# Patient Record
Sex: Male | Born: 1935 | Race: Black or African American | Hispanic: No | Marital: Married | Smoking: Current some day smoker
Health system: Southern US, Community
[De-identification: ages and names within clinical notes are randomized; demographics above are authoritative.]

## PROBLEM LIST (undated history)

## (undated) DIAGNOSIS — I209 Angina pectoris, unspecified: Secondary | ICD-10-CM

## (undated) DIAGNOSIS — I4891 Unspecified atrial fibrillation: Secondary | ICD-10-CM

## (undated) DIAGNOSIS — C187 Malignant neoplasm of sigmoid colon: Secondary | ICD-10-CM

## (undated) DIAGNOSIS — M109 Gout, unspecified: Secondary | ICD-10-CM

## (undated) DIAGNOSIS — I2510 Atherosclerotic heart disease of native coronary artery without angina pectoris: Secondary | ICD-10-CM

## (undated) DIAGNOSIS — R32 Unspecified urinary incontinence: Secondary | ICD-10-CM

## (undated) DIAGNOSIS — E785 Hyperlipidemia, unspecified: Secondary | ICD-10-CM

## (undated) DIAGNOSIS — I10 Essential (primary) hypertension: Secondary | ICD-10-CM

## (undated) DIAGNOSIS — K219 Gastro-esophageal reflux disease without esophagitis: Secondary | ICD-10-CM

## (undated) DIAGNOSIS — K449 Diaphragmatic hernia without obstruction or gangrene: Secondary | ICD-10-CM

## (undated) DIAGNOSIS — E119 Type 2 diabetes mellitus without complications: Secondary | ICD-10-CM

## (undated) HISTORY — DX: Gastro-esophageal reflux disease without esophagitis: K21.9

## (undated) HISTORY — PX: MINOR CARPAL TUNNEL: SHX6472

## (undated) HISTORY — DX: Atherosclerotic heart disease of native coronary artery without angina pectoris: I25.10

## (undated) HISTORY — DX: Malignant neoplasm of sigmoid colon: C18.7

## (undated) HISTORY — PX: OTHER SURGICAL HISTORY: SHX169

## (undated) HISTORY — DX: Type 2 diabetes mellitus without complications: E11.9

## (undated) HISTORY — DX: Diaphragmatic hernia without obstruction or gangrene: K44.9

## (undated) HISTORY — PX: REPLACEMENT TOTAL KNEE: SUR1224

## (undated) HISTORY — DX: Gout, unspecified: M10.9

## (undated) HISTORY — DX: Unspecified urinary incontinence: R32

## (undated) HISTORY — PX: COLONOSCOPY: SHX174

## (undated) HISTORY — PX: CHOLECYSTECTOMY: SHX55

## (undated) HISTORY — DX: Hyperlipidemia, unspecified: E78.5

---

## 2006-08-31 ENCOUNTER — Ambulatory Visit: Payer: Self-pay | Admitting: Cardiology

## 2006-09-07 ENCOUNTER — Ambulatory Visit: Payer: Self-pay | Admitting: Cardiology

## 2007-04-25 ENCOUNTER — Ambulatory Visit: Payer: Self-pay | Admitting: Cardiology

## 2009-02-04 ENCOUNTER — Ambulatory Visit: Payer: Self-pay | Admitting: Cardiology

## 2009-08-04 DIAGNOSIS — I2510 Atherosclerotic heart disease of native coronary artery without angina pectoris: Secondary | ICD-10-CM

## 2009-08-04 DIAGNOSIS — Z9861 Coronary angioplasty status: Secondary | ICD-10-CM

## 2009-08-04 DIAGNOSIS — E785 Hyperlipidemia, unspecified: Secondary | ICD-10-CM

## 2011-03-02 NOTE — Assessment & Plan Note (Signed)
[facility]                          [facility]   NAME:Antonio Watkins, ZEESHAN KORTE                     MRN:          [id number]  DATE:04/25/2007                            DOB:          1930-09-18    SUMMARY OF HISTORY:  Mr. Burnham is a 77 year old African American male  who returns for a 52-month followup.  He was last seen in the office on  August 31, 2006.  At that time, he was having some problems with  lethargy, fatigue, decreased stamina, and one episode of atypical chest  discomfort.   Since that time, he has not had any further problems with chest  discomfort.  He states that his energy level has not been a problem, and  goes on to describe last weekend he had a family reunion and did a 1-  mile hike up a mountain without any difficulties.  He has not had to use  nitroglycerin.   On review of records, Dr. Andee Lineman had deferred lipid management to his  primary care physician.  However, the patient states that this has not  been checked within the last year.   PAST MEDICAL HISTORY:   ALLERGIES:  MORPHINE.   MEDICATIONS:  1. Atenolol 25 mg daily.  2. Benazepril 40 mg daily.  3. Felodipine 10 mg daily.  4. Colchicine 0.6 daily.  5. HCTZ 12.5 daily.  6. Isosorbide 30 mg daily.  7. Oxybutynin 5 mg at bedtime.  8. Centrum Silver daily.  9. Aspirin 81 mg daily.  10.Nitroglycerin 0.4 p.r.n.   PAST MEDICAL HISTORY:  1. Bilateral total knee replacement.  2. Colon resection secondary to cancer, and takedown of an ostomy.  3. Eye surgery.  4. Bilateral carpal tunnel release.  5. Coronary artery disease, with stent placement in 2001 and 2002,      with Multi-Link Tetra stents to the RCA and circumflex.  He      received a Cordis stent to the circumflex in 2002.  6. He also has a history of hypertension.  7. Non-insulin-dependent diabetes.   SOCIAL AND FAMILY HISTORY:  Remain unchanged from his office visit on  September 01, 2007.   PHYSICAL EXAMINATION:  GENERAL:  Well-nourished, well-developed,  pleasant African American male in no apparent distress.  VITAL SIGNS:  Weight is 213.4, blood pressure 136/83, heart rate 56.  HEENT:  Unremarkable.  NECK:  Supple, without thyromegaly, adenopathy, JVD, or carotid bruits.  CHEST:  Symmetrical excursion.  LUNGS:  Clear to auscultation.  HEART:  PMI was not displaced.  Regular rate and rhythm.  I did not  appreciate any murmurs, rubs, clicks, or gallops.  ABDOMEN:  Slightly rounded.  Bowel sounds present, without organomegaly,  masses, or tenderness.  EXTREMITIES:  Negative cyanosis, clubbing, or edema.  Peripheral pulses  were symmetrical, intact, without abdominal or femoral bruits.   IMPRESSION:  1. Known coronary artery disease.  Appears to be stable at this point.  2. Hyperlipidemia.  Last check seems to be from his cardiologist in      [place], [place] and showed a total cholesterol of 228, HDL  40, LDL 151, and triglycerides 83 on June 04, 2006.  3. History as listed above.   DISPOSITION:  Dr. Gerhard Munch faxed up his lipids, and we reviewed.  Given  his history of diabetes and coronary artery disease, we will start him  on Lipitor 40 mg p.o. at bedtime.  We have written him a 90-day supply  that he will begin once he receives from his mail order.  We will  recheck fasting lipids and LFTs in approximately 8 weeks.  He was also  encouraged regular exercise and lowfat, low-salt, ADA diet.  We will see  him again in 1 year, unless he should develop any questions or problems.      Joellyn Rued, PA-C  Electronically Signed      Learta Codding, MD,FACC  Electronically Signed   EW/MedQ  DD: 04/25/2007  DT: 04/26/2007  Job #: [id number]   cc:   Linward Foster

## 2011-03-02 NOTE — Assessment & Plan Note (Signed)
[facility]                          [facility]   NAME:Antonio Watkins, Antonio Watkins                     MRN:          [id number]  DATE:02/04/2009                            DOB:          [birth year]    REFERRING PHYSICIAN:  Linward Foster   HISTORY OF PRESENT ILLNESS:  The patient is a 77 year old African  American male with history of coronary artery disease status post  Multiple-Link Tetra stents to the RCA and circumflex in 2000 and 2002  respectively as well as a coronary stent to the circumflex in 2002.  The  patient has not been seen in this office since 2008.  He did have a  stress test in 2007 which was essentially  low risk and negative for  ischemia.  The patient states that he is doing well.  He has no chest  pain, shortness of breath, orthopnea, or PND, although he carries  nitroglycerin, he has not used any in many years.  From a cardiovascular  perspective, he reports no symptoms of palpitations, presyncope, or  syncope.  He does state that he has a hard time of affording Welchol for  his dyslipidemia and apparently statins were discontinued because of  liver function elevation.   MEDICATIONS:  1. Atenolol 25 mg p.o. daily.  2. Benazepril mg p.o. daily.  3. Felodipine 10 mg p.o. daily.  4. Hydrochlorothiazide 12.5 mg p.o. daily.  5. Isosorbide 30 mg p.o. daily.  6. Oxybutynin 5 mg p.o. nightly.  7. Centrum Silver.  8. Aspirin 81 mg daily.  9. Colchicine 0.6 mg t.i.d.  10.Omega-3 fish oil daily.   PHYSICAL EXAMINATION:  VITAL SIGNS:  Blood pressure is 154/86, heart  rate 59, and weight 226 pounds.  GENERAL:  Well-nourished African American male in no apparent distress.  HEENT:  Pupils; eyes are clear.  Conjunctivae clear.  NECK:  Supple.  Normal carotid upstroke.  No carotid bruits.  LUNGS:  Clear breath sounds bilaterally.  HEART:  Regular rate and rhythm.  Normal S1 and S2.  No murmurs, rubs,  or gallops.  ABDOMEN:  Soft  and nontender.  No rebound or guarding.  There is notable  for well-healed scar prior to colon resection and takedown of an ostomy.  EXTREMITIES:  No cyanosis, clubbing, or edema.   PROBLEM LIST:  1. Coronary artery disease, see details above, stable.  2. Dyslipidemia.  Intolerant to STATINS (liver function elevation).  3. Colon resection secondary to takedown of an ostomy.  4. Bilateral total knee replacement.  5. Hypertension.  6. Non-insulin-dependent diabetes mellitus.   PLAN:  1. The patient's hypertension was probably well controlled.  Because      he did not take his medications this morning.  He just had a recent      check up with Dr. Gerhard Munch and everything appeared to be fine      regarding his blood pressure.  2. From a cardiac standpoint, the patient is also stable with no new      symptoms of angina or exertional dyspnea.  Although it is almost a  year since stress testing, I do not think he needs 1 at this point      in time and defer to next year.  3. The patient EKG was reviewed and there were no new EKG changes,      remains in normal sinus rhythm.  4. The patient does have baseline elevated liver function test, but I      do think trying to get him on red yeast rice with close followup in      4-6 weeks of liver function test should be safe.  He is unable to      afford WelChol and he is noncompliant with taking the medication.   We will see the patient back in 1 year.     Learta Codding, MD,FACC  Electronically Signed    GED/MedQ  DD: 02/04/2009  DT: 02/04/2009  Job #: [id number]   cc:   Linward Foster

## 2011-03-05 NOTE — Assessment & Plan Note (Signed)
[facility]                            [facility]   NAME:Antonio Watkins, Antonio Watkins                       MRN:          [id number]  DATE:08/31/2006                            DOB:          May 10, 1930    HISTORY OF PRESENT ILLNESS:  The patient is a 77 year old male with a  history of coronary artery disease.  The patient is status post prior  percutaneous coronary intervention with stent placement 2001 and 2002  respectively.  The patient received a Multi-Link Tetra stent to the right  coronary artery and a Multi-Link Tetra stent to the circumflex coronary  artery.  He also received a Cordis stent to the circumflex in 2002.  The  patient does have multiple cardiac risk factors including hypertension, non-  insulin-dependent diabetes mellitus.  He states that these prior procedures  were done at [facility] but has not seen a cardiologist in the last 5  years.  The patient has now been referred by Dr. Gerhard Munch for further cardiac  followup.  The patient's wife is also seen by Dr. Gerhard Munch and they have moved  from [place] to see Dr. Gerhard Munch in [place].   The patient's complaints center around symptoms of lethargy, fatigue, and  decreased stamina.  He denies any shortness of breath, orthopnea, or PND.  He does have occasional left-sided sharp pain which is not particularly  reminiscent of his prior presentation.  He states in the past prior to his  stent placement he had heavy substernal crushing chest pain.  He does have  occasional sweats and remains tired most of the time.  In the last few days  he experienced a single episode of left arm pain which was nonexertional.  It lasted for approximately 5 minutes while he was sitting.  As noted above,  also, the patient has noticed over the last several months the occurrence of  ankle edema.  He is quite concerned that this may be secondary to his  underlying heart disease.   PAST MEDICAL HISTORY:   See problem list below.  This also includes:  1. Bilateral total knee replacement.  2. History of colon resection secondary to colon cancer with takedown of      an ostomy.  3. History of eye surgery.  4. History of bilateral carpal tunnel release.   SOCIAL HISTORY:  The patient lives with his wife in [place], [place].  He does not smoke or drink.   FAMILY HISTORY:  Notable for mother who had a myocardial infarction.   MEDICATION:  1. Atenolol 25 mg a day.  2. Benazepril 40 mg a day.  3. Felodipine 10 mg a day.  4. Colchicine 0.6 mg p.o. daily.  5. Hydrochlorothiazide 12.5 mg a day.  6. Isosorbide 30 mg a day.  7. Oxybutynin 5 mg p.o. q.h.s.  8. Centrum Silver.  9. Aspirin 81 mg a day.   REVIEW OF SYSTEMS:  As per HPI.  The patient reports weight gain.  He also  reports chest pain as described above and occasional palpitations.  He  reports heartburn but no dysuria or frequency,  no cramps or joint stiffness,  no weakness or tremors.  No melena or hematochezia, no dysuria or frequency.   PHYSICAL EXAMINATION:  VITAL SIGNS:  Blood pressure 126/76, heart rate is 53  beats per minute.  NECK:  Reveals a normal carotid upstroke and no carotid bruits.  JVD is 6  cm.  HEENT:  Pupils isocoric, conjunctivae clear.  Oropharynx is clear.  LUNG:  Reveals clear breath sounds bilaterally.  HEART:  Reveals a regular rate and rhythm with normal S1 and S2.  There are  no pathological murmurs.  ABDOMEN:  Soft, nontender.  No rebound or guarding, good bowel sounds.  EXTREMITIES:  No cyanosis, clubbing or edema.  NEUROLOGIC:  The patient is alert, oriented, and grossly nonfocal.   A 12-lead electrocardiogram:  Sinus bradycardia with first-degree AV block  and nonspecific T-wave changes.   PROBLEM LIST:  1. Coronary artery disease.      a.     Atypical chest pain.      b.     Status post percutaneous coronary intervention and stent       placement to the right coronary artery and  circumflex x2 including       Cypher Cordis stent.      c.     Decreased energy level.  2. Hypertension.  3. Diabetes mellitus.  4. History of colon cancer.   PLAN:  1. The patient has known coronary artery disease.  He has not had a stress      test done in the last 3 years.  2. He does report occasional chest pain and left arm pain which is      atypical.  The patient will need further risk factor modification.  In      particular, he is currently not taking statin drug therapy and this      should be added to his medical regimen, particularly the fact that this      patient is diabetic.  I will leave this management to Dr. Gerhard Munch to      make a recommendation.  3. The patient will be referred for Cardiolite stress testing to make sure      that he does not have worsening ischemia.  4. The patient will follow up with [place] in the next couple of months and we      will report to him the results of the stress study.  5. The patient does have significant edema and I have increased his      hydrochlorothiazide to 25 mg a day.  It may well be that the patient      will require Lasix in the future.  I have also      scheduled the patient for a BMET and BNP level to make sure he does not      have significant renal insufficiency.     Learta Codding, MD,FACC  Electronically Signed    GED/MedQ  DD: 08/31/2006  DT: 08/31/2006  Job #: [id number]   cc:   Linward Foster, M.D.

## 2013-01-17 ENCOUNTER — Encounter: Payer: Self-pay | Admitting: Physician Assistant

## 2013-01-17 ENCOUNTER — Encounter: Payer: Self-pay | Admitting: Cardiovascular Disease

## 2013-01-18 ENCOUNTER — Encounter: Payer: Self-pay | Admitting: Physician Assistant

## 2013-04-12 ENCOUNTER — Encounter: Payer: Self-pay | Admitting: Cardiology

## 2013-04-23 ENCOUNTER — Encounter: Payer: Self-pay | Admitting: Physician Assistant

## 2013-05-09 ENCOUNTER — Encounter: Payer: Self-pay | Admitting: Cardiovascular Disease

## 2013-05-09 ENCOUNTER — Ambulatory Visit (INDEPENDENT_AMBULATORY_CARE_PROVIDER_SITE_OTHER): Payer: Medicare PPO | Admitting: Cardiovascular Disease

## 2013-05-09 VITALS — BP 147/84 | HR 73 | Ht 70.5 in | Wt 227.0 lb

## 2013-05-09 DIAGNOSIS — I2510 Atherosclerotic heart disease of native coronary artery without angina pectoris: Secondary | ICD-10-CM

## 2013-05-09 DIAGNOSIS — E785 Hyperlipidemia, unspecified: Secondary | ICD-10-CM

## 2013-05-09 DIAGNOSIS — I10 Essential (primary) hypertension: Secondary | ICD-10-CM

## 2013-05-09 MED ORDER — ATENOLOL 50 MG PO TABS: 50.0000 mg | ORAL_TABLET | Freq: Every day | ORAL | Status: DC

## 2013-05-09 MED ORDER — NITROGLYCERIN 0.4 MG SL SUBL: 0.4000 mg | SUBLINGUAL_TABLET | SUBLINGUAL | Status: DC | PRN

## 2013-05-09 NOTE — Progress Notes (Signed)
Patient ID: Antonio Watkins, male   DOB: 1930/06/12, 79 y.o.   MRN: [id number]    CARDIOLOGY CONSULT NOTE  Patient ID: Antonio Watkins MRN: [id number] DOB/AGE: 22-Mar-1930 79 y.o.  Primary Physician: Loney Hering, MD Reason for Consultation: CAD  HPI:  Antonio Watkins is an 79 year old African American male with history of coronary artery disease status post Multiple-Link Tetra stents to the RCA and circumflex in 2000 and 2002  Respectively, as well as a coronary stent to the circumflex in 2002.  He did have a stress echocardiogram in April 2014 which was essentially low risk and negative for  ischemia.   The patient states that he is doing well. He gets chest pain on occasion (2-3 times a month), associated with shortness of breath, but it only lasts a minute. He denies orthopnea and PND. Although he carries nitroglycerin, he has not used any in over two years. He says it has expired. He also denies palpitations, presyncope, or syncope.   He also has diabetes.  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 He has also lived in [place].  Review of systems complete and found to be negative unless listed above in HPI  Past Medical History:   No family history on file.  History   Social History  . Marital Status: Married    Spouse Name: N/A    Number of Children: N/A  . Years of Education: N/A   Occupational History  . Not on file.   Social History Main Topics  . Smoking status: Current Every Day Smoker -- 1.00 packs/day    Types: Cigars  . Smokeless tobacco: Not on file  . Alcohol Use: Yes     Comment: Socially  . Drug Use: Not on file  . Sexually Active: Not on file   Other Topics Concern  . Not on file   Social History Narrative  . No narrative on file     Physical exam Blood pressure 147/84, pulse 73, height 5' 10.5" (1.791 m), weight 227 lb (102.967 kg). General: NAD Neck: No JVD, no thyromegaly or thyroid nodule.   Lungs: Clear to auscultation bilaterally with normal respiratory effort. CV: Nondisplaced PMI.  Heart regular S1/S2, no S3/S4, no murmur. 1+ pitting pedal edema.  No carotid bruit.  Normal pedal pulses.  Abdomen: Soft, nontender, no hepatosplenomegaly, no distention.  Skin: Intact without lesions or rashes.  Neurologic: Alert and oriented x 3.  Psych: Normal affect. Extremities: No clubbing or cyanosis.  HEENT: Normal.   Labs:   No results found for this basename: WBC, HGB, HCT, MCV, PLT   No results found for this basename: NA, K, CL, CO2, BUN, CREATININE, CALCIUM, LABALBU, PROT, BILITOT, ALKPHOS, ALT, AST, GLUCOSE,  in the last 168 hours No results found for this basename: CKTOTAL, CKMB, CKMBINDEX, TROPONINI    No results found for this basename: CHOL   No results found for this basename: HDL   No results found for this basename: LDLCALC   No results found for this basename: TRIG   No results found for this basename: CHOLHDL   No results found for this basename: LDLDIRECT       EKG: Sinus rhythm, rate 73 bpm, axis within normal limits, intervals within normal limits, no acute ST-T wave changes.     ASSESSMENT AND PLAN:  1. CAD: will prescribe sublingual nitroglycerin tablets to be used prn. No other changes. He is already on Atenolol (Carvedilol stopped  in April 2014).  2. HTN: uncontrolled, will increase Atenolol to 50 mg daily. Already taking Losartan 100 mg daily. His BP is checked every Monday at church, and was told it was high at that time.  3. Hypercholesterolemia: on Lipitor.  Signed: Prentice Docker, M.D., F.A.C.C. 05/09/2013, 2:27 PM

## 2013-05-09 NOTE — Patient Instructions (Addendum)
Your physician has recommended you make the following change in your medication:   INCREASE ATENOLOL 50 MG DAILY  TAKE ALL YOUR OTHER MEDICATIONS THE SAME   REFILLED YOUR NITRO TODAY  Your physician wants you to follow-up in: 1 YEAR You will receive a reminder letter in the mail two months in advance. If you don't receive a letter, please call our office to schedule the follow-up appointment.

## 2014-05-15 ENCOUNTER — Ambulatory Visit (INDEPENDENT_AMBULATORY_CARE_PROVIDER_SITE_OTHER): Payer: Medicare PPO | Admitting: Cardiovascular Disease

## 2014-05-15 ENCOUNTER — Encounter: Payer: Self-pay | Admitting: Cardiovascular Disease

## 2014-05-15 VITALS — BP 136/77 | HR 63 | Ht 70.5 in | Wt 231.0 lb

## 2014-05-15 DIAGNOSIS — I209 Angina pectoris, unspecified: Secondary | ICD-10-CM

## 2014-05-15 DIAGNOSIS — I2510 Atherosclerotic heart disease of native coronary artery without angina pectoris: Secondary | ICD-10-CM

## 2014-05-15 DIAGNOSIS — I25119 Atherosclerotic heart disease of native coronary artery with unspecified angina pectoris: Secondary | ICD-10-CM

## 2014-05-15 DIAGNOSIS — I10 Essential (primary) hypertension: Secondary | ICD-10-CM

## 2014-05-15 DIAGNOSIS — E785 Hyperlipidemia, unspecified: Secondary | ICD-10-CM

## 2014-05-15 NOTE — Progress Notes (Signed)
Patient ID: Antonio Watkins, male   DOB: [birth year], 80 y.o.   MRN: [id number]      SUBJECTIVE: Antonio Watkins is an 80 year old African American male with a history of coronary artery disease status post Multiple-Link Tetra stents to the RCA and circumflex in 2000 and 2002 respectively.  He had a stress echocardiogram in April 2014 which was essentially low risk and negative for  ischemia. He also has diabetes, essential hypertension and hyperlipidemia. He very seldom experiences chest pain with radiation down the left arm, but has not had to use any sublingual nitroglycerin. He very seldom experiences shortness of breath. He has bilateral knee swelling related to arthritis and has had both knees replaced. He traveled to [facility] over the Fourth of July weekend for a family reunion. 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 He has also lived in [place]. He knew all the Steelers from [place] era, including Young Berry, Gary Fleet, Leanna Battles, and Mean Welford Roche.    Review of Systems: As per "subjective", otherwise negative.  Allergies  Allergen Reactions  . Morphine And Related Swelling    Current Outpatient Prescriptions  Medication Sig Dispense Refill  . amLODipine (NORVASC) 10 MG tablet Take 10 mg by mouth daily.      Marland Kitchen aspirin 81 MG tablet Take 81 mg by mouth daily.      Marland Kitchen atenolol (TENORMIN) 25 MG tablet Take 1 tablet by mouth 2 (two) times daily.       Marland Kitchen atorvastatin (LIPITOR) 40 MG tablet Take 40 mg by mouth daily.      Marland Kitchen azelastine (ASTELIN) 137 MCG/SPRAY nasal spray Place 2 sprays into the nose as needed. Use in each nostril as directed      . cetirizine (ZYRTEC) 10 MG tablet Take 10 mg by mouth as needed.       . clonazePAM (KLONOPIN) 0.5 MG tablet Take 0.25 mg by mouth at bedtime as needed  for anxiety.      . diclofenac (VOLTAREN) 75 MG EC tablet Take 75 mg by mouth 2 (two) times daily.      Marland Kitchen docusate sodium (COLACE) 100 MG capsule Take 100 mg by mouth 2 (two) times daily.      . furosemide (LASIX) 20 MG tablet Take 20 mg by mouth daily.       Marland Kitchen gabapentin (NEURONTIN) 300 MG capsule Take 300 mg by mouth at bedtime.      . insulin NPH-regular (NOVOLIN 70/30) (70-30) 100 UNIT/ML injection Inject 5 Units into the skin 2 (two) times daily with a meal.      . isosorbide mononitrate (IMDUR) 30 MG 24 hr tablet Take 30 mg by mouth daily.      Marland Kitchen losartan (COZAAR) 100 MG tablet Take 100 mg by mouth daily.      . metFORMIN (GLUCOPHAGE) 500 MG tablet Take 1 tablet by mouth 2 (two) times daily.      . Multiple Vitamin (MULTIVITAMIN) tablet Take 1 tablet by mouth daily.      . nitroGLYCERIN (NITROSTAT) 0.4 MG SL tablet Place 1 tablet (0.4 mg total) under the tongue every 5 (five) minutes as needed for chest pain.  75 tablet  3  . OMEGA 3 1000 MG CAPS Take 1,000 mg by mouth 2 (two) times daily.      Marland Kitchen  omeprazole (PRILOSEC) 40 MG capsule Take 40 mg by mouth daily.      . polyethylene glycol powder (GLYCOLAX/MIRALAX) powder Take 17 g by mouth daily.      . Potassium Gluconate 595 MG CAPS Take 1 capsule by mouth 2 (two) times daily.       No current facility-administered medications for this visit.    Past Medical History  Diagnosis Date  . DM type 2 (diabetes mellitus, type 2)   . CAD (coronary artery disease)   . Hyperlipemia   . GERD (gastroesophageal reflux disease)   . Hiatal hernia   . Gout   . Incontinence   . Cancer of sigmoid colon     No past surgical history on file.  History   Social History  . Marital Status: Married    Spouse Name: N/A    Number of Children: N/A  . Years of Education: N/A   Occupational History  . Not on file.   Social History Main Topics  . Smoking status: Former Smoker -- 1.00 packs/day    Types: Cigars    Quit date: 07/18/2013  .  Smokeless tobacco: Never Used  . Alcohol Use: Yes     Comment: Socially  . Drug Use: Not on file  . Sexual Activity: Not on file   Other Topics Concern  . Not on file   Social History Narrative  . No narrative on file     Filed Vitals:   05/15/14 1317  BP: 136/77  Pulse: 63  Height: 5' 10.5" (1.791 m)  Weight: 231 lb (104.781 kg)  SpO2: 95%    PHYSICAL EXAM General: NAD Neck: No JVD, no thyromegaly. Lungs: Clear to auscultation bilaterally with normal respiratory effort. CV: Nondisplaced PMI.  Regular rate and rhythm, normal S1/S2, no S3/S4, no murmur. No pretibial or periankle edema.  No carotid bruit.  Normal pedal pulses.  Abdomen: Soft, nontender, no hepatosplenomegaly, no distention.  Neurologic: Alert and oriented x 3.  Psych: Normal affect. Extremities: No clubbing or cyanosis.   ECG: reviewed and available in electronic records.      ASSESSMENT AND PLAN: 1. CAD: Symptomatically stable. Continue aspirin, atenolol, Imdur 30 mg daily and Lipitor.  2. Essential HTN: Controlled on present therapy.  3. Hypercholesterolemia: Will check lipids and LFT's. On Lipitor.  Dispo: f/u 6 months.  Kate Sable, M.D., F.A.C.C.

## 2014-05-15 NOTE — Patient Instructions (Signed)
   Labs for lipids and liver function  Office will contact with results.  Please call office with updated medication list  Follow up in 6 months. You will receive a reminder letter in the mail 2 months prior.

## 2014-05-16 ENCOUNTER — Encounter: Payer: Self-pay | Admitting: *Deleted

## 2014-05-24 ENCOUNTER — Telehealth: Payer: Self-pay | Admitting: *Deleted

## 2014-05-24 NOTE — Telephone Encounter (Signed)
Message copied by Laurine Blazer on Fri May 24, 2014  4:39 PM ------      Message from: Orion Modest      Created: Tue May 21, 2014  2:12 PM                   ----- Message -----         From: Herminio Commons, MD         Sent: 05/21/2014  12:59 PM           To: Orion Modest, CMA            Increase Lipitor to 60 mg daily and repeat lipids and LFT's in 3 months. ------

## 2014-05-24 NOTE — Telephone Encounter (Signed)
Notes Recorded by Laurine Blazer, LPN on 02/21/8615 at 8:37 PM Left message to return call.

## 2014-06-03 NOTE — Telephone Encounter (Signed)
Notes Recorded by Laurine Blazer, LPN on 4/38/3779 at 3:96 PM Left message to return call.

## 2014-06-13 MED ORDER — ATORVASTATIN CALCIUM 40 MG PO TABS: 60.0000 mg | ORAL_TABLET | Freq: Every day | ORAL | Status: DC

## 2014-06-13 NOTE — Telephone Encounter (Signed)
Notes Recorded by Laurine Blazer, LPN on 9/79/4801 at 65:53 AM Patient notified & verbalized understanding. Will send new rx to [facility] at patient request. Will mail reminder when time to repeat labs.

## 2014-10-08 ENCOUNTER — Encounter: Payer: Self-pay | Admitting: *Deleted

## 2014-11-08 ENCOUNTER — Ambulatory Visit: Payer: Medicare PPO | Admitting: Cardiovascular Disease

## 2014-11-25 ENCOUNTER — Ambulatory Visit (INDEPENDENT_AMBULATORY_CARE_PROVIDER_SITE_OTHER): Payer: Medicare PPO | Admitting: Cardiovascular Disease

## 2014-11-25 ENCOUNTER — Encounter: Payer: Self-pay | Admitting: Cardiovascular Disease

## 2014-11-25 VITALS — BP 138/88 | HR 68 | Ht 70.0 in | Wt 240.0 lb

## 2014-11-25 DIAGNOSIS — E785 Hyperlipidemia, unspecified: Secondary | ICD-10-CM

## 2014-11-25 DIAGNOSIS — I25119 Atherosclerotic heart disease of native coronary artery with unspecified angina pectoris: Secondary | ICD-10-CM

## 2014-11-25 DIAGNOSIS — I10 Essential (primary) hypertension: Secondary | ICD-10-CM

## 2014-11-25 MED ORDER — NITROGLYCERIN 0.4 MG SL SUBL: 0.4000 mg | SUBLINGUAL_TABLET | SUBLINGUAL | Status: DC | PRN

## 2014-11-25 MED ORDER — ISOSORBIDE MONONITRATE ER 60 MG PO TB24: 60.0000 mg | ORAL_TABLET | Freq: Every day | ORAL | Status: DC

## 2014-11-25 NOTE — Progress Notes (Signed)
Patient ID: Antonio Watkins, male   DOB: [birth year], 81 y.o.   MRN: [id number]      SUBJECTIVE: Antonio Watkins presents for routine follow up. He has a history of coronary artery disease status post Multiple-Link Tetra stents to the RCA and circumflex in 2000 and 2002 respectively.  He had a stress echocardiogram in April 2014 which was essentially low risk and negative for ischemia. He also has diabetes, essential hypertension and hyperlipidemia. He has been experiencing chest pain about twice per week over the past four weeks, with radiation down the left arm.   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 He has also lived in [place]. He knew all the Steelers from "[place]" era, including Young Berry, Gary Fleet, Leanna Battles, and Mean Welford Roche.    Review of Systems: As per "subjective", otherwise negative.  Allergies  Allergen Reactions  . Morphine And Related Swelling    Current Outpatient Prescriptions  Medication Sig Dispense Refill  . amLODipine (NORVASC) 10 MG tablet Take 10 mg by mouth daily.    Marland Kitchen aspirin 81 MG tablet Take 81 mg by mouth daily.    Marland Kitchen atenolol (TENORMIN) 25 MG tablet Take 1 tablet by mouth 2 (two) times daily.     Marland Kitchen atorvastatin (LIPITOR) 40 MG tablet Take 1.5 tablets (60 mg total) by mouth daily. 135 tablet 3  . cetirizine (ZYRTEC) 10 MG tablet Take 10 mg by mouth as needed.     . diclofenac (VOLTAREN) 75 MG EC tablet Take 75 mg by mouth 2 (two) times daily.    Marland Kitchen docusate sodium (COLACE) 100 MG capsule Take 100 mg by mouth 2 (two) times daily.    Marland Kitchen gabapentin (NEURONTIN) 300 MG capsule Take 300 mg by mouth at bedtime.    . indomethacin (INDOCIN SR) 75 MG CR capsule Take 75 mg by mouth 2 (two) times daily as needed.    . insulin NPH-regular (NOVOLIN 70/30) (70-30) 100 UNIT/ML injection Inject 5 Units into the skin 2 (two) times daily as needed.     . isosorbide mononitrate (IMDUR) 30 MG 24 hr  tablet Take 30 mg by mouth daily.    Marland Kitchen losartan (COZAAR) 100 MG tablet Take 100 mg by mouth daily.    . metFORMIN (GLUCOPHAGE) 500 MG tablet Take 1 tablet by mouth 2 (two) times daily.    . Multiple Vitamin (MULTIVITAMIN) tablet Take 1 tablet by mouth daily.    . nitroGLYCERIN (NITROSTAT) 0.4 MG SL tablet Place 1 tablet (0.4 mg total) under the tongue every 5 (five) minutes as needed for chest pain. 75 tablet 3  . Omega-3 Fatty Acids (OMEGA-3 FISH OIL) 1200 MG CAPS Take 1 capsule by mouth 2 (two) times daily.    Marland Kitchen omeprazole (PRILOSEC) 40 MG capsule Take 40 mg by mouth daily.    . polyethylene glycol powder (GLYCOLAX/MIRALAX) powder Take 17 g by mouth daily.    . Potassium Gluconate 595 MG CAPS Take 1 capsule by mouth 2 (two) times daily.    . sodium chloride (OCEAN) 0.65 % SOLN nasal spray Place 1 spray into both nostrils as needed for congestion.     No current facility-administered medications for this visit.    Past Medical History  Diagnosis Date  . DM type 2 (diabetes mellitus, type 2)   . CAD (coronary artery disease)   . Hyperlipemia   . GERD (gastroesophageal reflux disease)   . Hiatal hernia   .  Gout   . Incontinence   . Cancer of sigmoid colon     No past surgical history on file.  History   Social History  . Marital Status: Married    Spouse Name: N/A    Number of Children: N/A  . Years of Education: N/A   Occupational History  . Not on file.   Social History Main Topics  . Smoking status: Former Smoker -- 1.00 packs/day    Types: Cigars    Quit date: 07/18/2013  . Smokeless tobacco: Never Used  . Alcohol Use: Yes     Comment: Socially  . Drug Use: Not on file  . Sexual Activity: Not on file   Other Topics Concern  . Not on file   Social History Narrative     Filed Vitals:   11/25/14 1422  BP: 138/88  Pulse: 68  Height: 5\' 10"  (1.778 m)  Weight: 240 lb (108.863 kg)  SpO2: 98%    PHYSICAL EXAM General: NAD HEENT: Normal. Neck: No JVD, no  thyromegaly. Lungs: Clear to auscultation bilaterally with normal respiratory effort. CV: Nondisplaced PMI.  Regular rate and rhythm, normal S1/S2, no S3/S4, no murmur. No pretibial or periankle edema.  No carotid bruit.  Normal pedal pulses.  Abdomen: Soft, nontender, no hepatosplenomegaly, no distention.  Neurologic: Alert and oriented x 3.  Psych: Normal affect. Skin: Normal. Musculoskeletal: Normal range of motion, no gross deformities. Extremities: No clubbing or cyanosis.   ECG: Most recent ECG reviewed.      ASSESSMENT AND PLAN: 1. Chest pain with CAD: Given acceleration of symptoms, I will increase Imdur to 60 mg daily. Continue aspirin, atenolol, Imdur 30 mg daily and Lipitor. If there is no symptom relief at next visit, I will proceed with stress testing.  2. Essential HTN: Controlled on present therapy. No changes.  3. Hypercholesterolemia: Lipids on 08/26/14 showed TC 163, TG 146, HDL 43, LDL 91. No changes to Lipitor.  Dispo: f/u 4-6 weeks.   Kate Sable, M.D., F.A.C.C.

## 2014-11-25 NOTE — Patient Instructions (Signed)
Your physician has recommended you make the following change in your medication:  Increase Imdur to 60mg  daily.  You may take 2 of your 30mg  tablets daily till finish current supply.  90 day supply has been sent to [facility].  Nitroglycerin refilled - also sent to [place] all other medications.   Follow up in  4-6 weeks

## 2014-12-27 ENCOUNTER — Encounter: Payer: Self-pay | Admitting: Cardiovascular Disease

## 2014-12-27 ENCOUNTER — Ambulatory Visit (INDEPENDENT_AMBULATORY_CARE_PROVIDER_SITE_OTHER): Payer: Medicare PPO | Admitting: Cardiovascular Disease

## 2014-12-27 VITALS — BP 148/68 | HR 63 | Ht 70.0 in | Wt 239.0 lb

## 2014-12-27 DIAGNOSIS — I209 Angina pectoris, unspecified: Secondary | ICD-10-CM

## 2014-12-27 DIAGNOSIS — I25118 Atherosclerotic heart disease of native coronary artery with other forms of angina pectoris: Secondary | ICD-10-CM

## 2014-12-27 DIAGNOSIS — I10 Essential (primary) hypertension: Secondary | ICD-10-CM

## 2014-12-27 DIAGNOSIS — E785 Hyperlipidemia, unspecified: Secondary | ICD-10-CM

## 2014-12-27 MED ORDER — HYDRALAZINE HCL 25 MG PO TABS: 25.0000 mg | ORAL_TABLET | Freq: Two times a day (BID) | ORAL | Status: DC

## 2014-12-27 NOTE — Patient Instructions (Signed)
Your physician recommends that you schedule a follow-up appointment in: 3 months. Your physician has recommended you make the following change in your medication:  Start hydralazine 25 mg twice daily. Continue all other medications the same.

## 2014-12-27 NOTE — Progress Notes (Signed)
Patient ID: Antonio Watkins, male   DOB: April 05, 1930, 81 y.o.   MRN: [id number]      SUBJECTIVE: Antonio Watkins presents for follow up of chest pain. He has a history of coronary artery disease status post Multiple-Link Tetra stents to the RCA and circumflex in 2000 and 2002 respectively.  He had a stress echocardiogram in April 2014 which was essentially low risk and negative for ischemia. He also has diabetes, essential hypertension and hyperlipidemia.  He had been experiencing chest pain about twice per week over the past several weeks, with radiation down the left arm at his last visit. I then increased Imdur from 30 to 60 mg daily. He is now feeling very well and denies chest pain altogether. He said his BP stays elevated. He has it checked by a nurse at his church and he says readings are always high (up to 628 mmHg systolic).   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 He knew all the Steelers from "[place]" era, including Young Berry, Gary Fleet, Carlisle Cater, Leanna Battles, and Mean Welford Roche. 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   Review of Systems: As per "subjective", otherwise negative.  Allergies  Allergen Reactions  . Morphine And Related Swelling    Current Outpatient Prescriptions  Medication Sig Dispense Refill  . amLODipine (NORVASC) 10 MG tablet Take 10 mg by mouth daily.    Marland Kitchen aspirin 81 MG tablet Take 81 mg by mouth daily.    Marland Kitchen atenolol (TENORMIN) 25 MG tablet Take 1 tablet by mouth 2 (two) times daily.     Marland Kitchen atorvastatin (LIPITOR) 40 MG tablet Take 1.5 tablets (60 mg total) by mouth daily. 135 tablet 3  . cetirizine (ZYRTEC) 10 MG tablet Take 10 mg by mouth as needed.     . diclofenac (VOLTAREN) 75 MG EC tablet Take 75 mg by mouth 2 (two) times daily.    Marland Kitchen docusate sodium (COLACE) 100 MG capsule Take 100 mg by mouth 2 (two)  times daily.    Marland Kitchen gabapentin (NEURONTIN) 300 MG capsule Take 300 mg by mouth at bedtime.    . indomethacin (INDOCIN SR) 75 MG CR capsule Take 75 mg by mouth 2 (two) times daily as needed.    . insulin NPH-regular (NOVOLIN 70/30) (70-30) 100 UNIT/ML injection Inject 5 Units into the skin 2 (two) times daily as needed.     . isosorbide mononitrate (IMDUR) 60 MG 24 hr tablet Take 1 tablet (60 mg total) by mouth daily. 90 tablet 3  . losartan (COZAAR) 100 MG tablet Take 100 mg by mouth daily.    . metFORMIN (GLUCOPHAGE) 500 MG tablet Take 1 tablet by mouth 2 (two) times daily.    . Multiple Vitamin (MULTIVITAMIN) tablet Take 1 tablet by mouth daily.    . nitroGLYCERIN (NITROSTAT) 0.4 MG SL tablet Place 1 tablet (0.4 mg total) under the tongue every 5 (five) minutes as needed for chest pain. 25 tablet 3  . Omega-3 Fatty Acids (OMEGA-3 FISH OIL) 1200 MG CAPS Take 1 capsule by mouth 2 (two) times daily.    Marland Kitchen omeprazole (PRILOSEC) 40 MG capsule Take 40 mg by mouth daily.    . polyethylene glycol powder (GLYCOLAX/MIRALAX) powder Take 17 g by mouth daily.    . Potassium Gluconate 595 MG CAPS Take 1 capsule by mouth 2 (two) times daily.    Marland Kitchen  sodium chloride (OCEAN) 0.65 % SOLN nasal spray Place 1 spray into both nostrils as needed for congestion.     No current facility-administered medications for this visit.    Past Medical History  Diagnosis Date  . DM type 2 (diabetes mellitus, type 2)   . CAD (coronary artery disease)   . Hyperlipemia   . GERD (gastroesophageal reflux disease)   . Hiatal hernia   . Gout   . Incontinence   . Cancer of sigmoid colon     No past surgical history on file.  History   Social History  . Marital Status: Married    Spouse Name: N/A  . Number of Children: N/A  . Years of Education: N/A   Occupational History  . Not on file.   Social History Main Topics  . Smoking status: Former Smoker -- 1.00 packs/day for 20 years    Types: Cigars    Start date:  07/18/1993    Quit date: 07/18/2013  . Smokeless tobacco: Never Used     Comment: Former cigarette smoker for 40 years prior to smoking cigars  . Alcohol Use: Yes     Comment: Socially  . Drug Use: Not on file  . Sexual Activity: Not on file   Other Topics Concern  . Not on file   Social History Narrative     Filed Vitals:   12/27/14 1348  BP: 148/68  Pulse: 63  Height: 5\' 10"  (1.778 m)  Weight: 239 lb (108.41 kg)  SpO2: 97%    PHYSICAL EXAM General: NAD HEENT: Normal. Neck: No JVD, no thyromegaly. Lungs: Clear to auscultation bilaterally with normal respiratory effort. CV: Nondisplaced PMI.  Regular rate and rhythm, normal S1/S2, no S3/S4, no murmur. No pretibial or periankle edema.  No carotid bruit.  Normal pedal pulses.  Abdomen: Soft, nontender, no hepatosplenomegaly, no distention.  Neurologic: Alert and oriented x 3.  Psych: Normal affect. Skin: Normal. Musculoskeletal: Normal range of motion, no gross deformities. Extremities: No clubbing or cyanosis.   ECG: Most recent ECG reviewed.      ASSESSMENT AND PLAN: 1. Chest pain with CAD: Symptom alleviation with increase of Imdur to 60 mg daily. Continue aspirin, atenolol, Imdur 60 mg daily and Lipitor.   2. Essential HTN: Mildly elevated today with more pronounced readings outside of the office. Will start hydralazine 25 mg bid to encourage compliance. Already taking amlodipine 10 mg and losartan 100 mg, along with atenolol 25 mg bid.  3. Hypercholesterolemia: Lipids on 08/26/14 showed TC 163, TG 146, HDL 43, LDL 91. No changes to Lipitor.  Dispo: f/u 3 months.   Kate Sable, M.D., F.A.C.C.

## 2015-04-07 ENCOUNTER — Encounter: Payer: Self-pay | Admitting: Cardiovascular Disease

## 2015-04-07 ENCOUNTER — Ambulatory Visit (INDEPENDENT_AMBULATORY_CARE_PROVIDER_SITE_OTHER): Payer: Medicare PPO | Admitting: Cardiovascular Disease

## 2015-04-07 VITALS — BP 124/63 | HR 66 | Ht 70.0 in | Wt 240.0 lb

## 2015-04-07 DIAGNOSIS — I25118 Atherosclerotic heart disease of native coronary artery with other forms of angina pectoris: Secondary | ICD-10-CM

## 2015-04-07 DIAGNOSIS — E785 Hyperlipidemia, unspecified: Secondary | ICD-10-CM

## 2015-04-07 DIAGNOSIS — I2510 Atherosclerotic heart disease of native coronary artery without angina pectoris: Secondary | ICD-10-CM | POA: Diagnosis not present

## 2015-04-07 DIAGNOSIS — I209 Angina pectoris, unspecified: Secondary | ICD-10-CM

## 2015-04-07 DIAGNOSIS — R9431 Abnormal electrocardiogram [ECG] [EKG]: Secondary | ICD-10-CM

## 2015-04-07 DIAGNOSIS — I10 Essential (primary) hypertension: Secondary | ICD-10-CM | POA: Diagnosis not present

## 2015-04-07 DIAGNOSIS — Z955 Presence of coronary angioplasty implant and graft: Secondary | ICD-10-CM

## 2015-04-07 NOTE — Patient Instructions (Signed)
Continue all current medications. Follow up in  3 months 

## 2015-04-07 NOTE — Progress Notes (Signed)
Patient ID: Antonio Watkins, male   DOB: Oct 26, 1929, 81 y.o.   MRN: [id number]      SUBJECTIVE: Antonio Watkins presents for follow up of chest pain, recently controlled with Imdur 60 mg daily. He has a history of coronary artery disease status post Multiple-Link Tetra stents to the RCA and circumflex in 2000 and 2002 respectively.  He had a stress echocardiogram in April 2014 which was essentially low risk and negative for ischemia. He also has diabetes, essential hypertension and hyperlipidemia.  He has had to use 3 or 4 sublingual nitroglycerin tablets since his last visit with me approximately 3 months ago. On the drive to this office today, he had some chest pain with some burning in his left arm.   ECG performed in the office today demonstrates normal sinus rhythm with a nonspecific T wave abnormality in leads V5 and V6. These are new when compared to an ECG dated 05/15/14.   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 He knew all the Steelers from "[place]" era, including Young Berry, Gary Fleet, Carlisle Cater, Leanna Battles, and Mean Welford Roche. 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   Review of Systems: As per "subjective", otherwise negative.  Allergies  Allergen Reactions  . Morphine And Related Swelling    Current Outpatient Prescriptions  Medication Sig Dispense Refill  . amLODipine (NORVASC) 10 MG tablet Take 10 mg by mouth daily.    Marland Kitchen aspirin 81 MG tablet Take 81 mg by mouth daily.    Marland Kitchen atenolol (TENORMIN) 25 MG tablet Take 1 tablet by mouth 2 (two) times daily.     Marland Kitchen atorvastatin (LIPITOR) 40 MG tablet Take 1.5 tablets (60 mg total) by mouth daily. 135 tablet 3  . cetirizine (ZYRTEC) 10 MG tablet Take 10 mg by mouth as needed.     . diclofenac (VOLTAREN) 75 MG EC tablet Take 75 mg by mouth 2 (two) times daily.    Marland Kitchen docusate sodium (COLACE)  100 MG capsule Take 100 mg by mouth 2 (two) times daily.    Marland Kitchen gabapentin (NEURONTIN) 300 MG capsule Take 300 mg by mouth at bedtime.    . hydrALAZINE (APRESOLINE) 25 MG tablet Take 1 tablet (25 mg total) by mouth 2 (two) times daily. 180 tablet 3  . indomethacin (INDOCIN SR) 75 MG CR capsule Take 75 mg by mouth 2 (two) times daily as needed.    . insulin NPH-regular (NOVOLIN 70/30) (70-30) 100 UNIT/ML injection Inject 5 Units into the skin 2 (two) times daily as needed.     . isosorbide mononitrate (IMDUR) 60 MG 24 hr tablet Take 1 tablet (60 mg total) by mouth daily. 90 tablet 3  . losartan (COZAAR) 100 MG tablet Take 100 mg by mouth daily.    . metFORMIN (GLUCOPHAGE) 500 MG tablet Take 1 tablet by mouth 2 (two) times daily.    . Multiple Vitamin (MULTIVITAMIN) tablet Take 1 tablet by mouth daily.    . nitroGLYCERIN (NITROSTAT) 0.4 MG SL tablet Place 1 tablet (0.4 mg total) under the tongue every 5 (five) minutes as needed for chest pain. 25 tablet 3  . Omega-3 Fatty Acids (OMEGA-3 FISH OIL) 1200 MG CAPS Take 1 capsule by mouth 2 (two) times daily.    Marland Kitchen omeprazole (PRILOSEC) 40 MG capsule Take 40 mg by mouth daily.    . polyethylene  glycol powder (GLYCOLAX/MIRALAX) powder Take 17 g by mouth daily.    . Potassium Gluconate 595 MG CAPS Take 1 capsule by mouth 2 (two) times daily.    . sodium chloride (OCEAN) 0.65 % SOLN nasal spray Place 1 spray into both nostrils as needed for congestion.     No current facility-administered medications for this visit.    Past Medical History  Diagnosis Date  . DM type 2 (diabetes mellitus, type 2)   . CAD (coronary artery disease)   . Hyperlipemia   . GERD (gastroesophageal reflux disease)   . Hiatal hernia   . Gout   . Incontinence   . Cancer of sigmoid colon     No past surgical history on file.  History   Social History  . Marital Status: Married    Spouse Name: N/A  . Number of Children: N/A  . Years of Education: N/A   Occupational  History  . Not on file.   Social History Main Topics  . Smoking status: Former Smoker -- 1.00 packs/day for 20 years    Types: Cigars    Start date: 07/18/1993    Quit date: 07/18/2013  . Smokeless tobacco: Never Used     Comment: Former cigarette smoker for 40 years prior to smoking cigars  . Alcohol Use: 0.0 oz/week    0 Standard drinks or equivalent per week     Comment: Socially  . Drug Use: Not on file  . Sexual Activity: Not on file   Other Topics Concern  . Not on file   Social History Narrative     Filed Vitals:   04/07/15 1330  BP: 124/63  Pulse: 66  Height: 5\' 10"  (1.778 m)  Weight: 240 lb (108.863 kg)    PHYSICAL EXAM General: NAD HEENT: Normal. Neck: No JVD, no thyromegaly. Lungs: Clear to auscultation bilaterally with normal respiratory effort. CV: Nondisplaced PMI.  Regular rate and rhythm, normal S1/S2, no S3/S4, no murmur. No pretibial or periankle edema.  No carotid bruit.  Normal pedal pulses.  Abdomen: Soft, nontender, no hepatosplenomegaly, no distention.  Neurologic: Alert and oriented x 3.  Psych: Normal affect. Skin: Normal. Musculoskeletal: Normal range of motion, no gross deformities. Extremities: No clubbing or cyanosis.   ECG: Most recent ECG reviewed.      ASSESSMENT AND PLAN: 1. Chest pain with CAD: Relatively stable ischemic heart disease. Continue aspirin, atenolol, Imdur 60 mg daily, and Lipitor.   2. Essential HTN: Well controlled on amlodipine 10 mg, atenolol 25 mg twice daily, hydralazine 25 mg twice daily, and losartan 100 mg daily. No changes.  3. Hypercholesterolemia: Lipids on 08/26/14 showed TC 163, TG 146, HDL 43, LDL 91. No changes to Lipitor.  Dispo: f/u 3 months.   Kate Sable, M.D., F.A.C.C.

## 2015-07-10 ENCOUNTER — Ambulatory Visit: Payer: Medicare PPO | Admitting: Cardiovascular Disease

## 2015-07-14 ENCOUNTER — Encounter: Payer: Self-pay | Admitting: *Deleted

## 2015-07-14 ENCOUNTER — Ambulatory Visit (INDEPENDENT_AMBULATORY_CARE_PROVIDER_SITE_OTHER): Payer: Medicare PPO | Admitting: Cardiovascular Disease

## 2015-07-14 ENCOUNTER — Encounter: Payer: Self-pay | Admitting: Cardiovascular Disease

## 2015-07-14 VITALS — BP 169/74 | HR 56 | Ht 70.0 in | Wt 232.0 lb

## 2015-07-14 DIAGNOSIS — I25118 Atherosclerotic heart disease of native coronary artery with other forms of angina pectoris: Secondary | ICD-10-CM

## 2015-07-14 DIAGNOSIS — E785 Hyperlipidemia, unspecified: Secondary | ICD-10-CM | POA: Diagnosis not present

## 2015-07-14 DIAGNOSIS — Z955 Presence of coronary angioplasty implant and graft: Secondary | ICD-10-CM | POA: Diagnosis not present

## 2015-07-14 DIAGNOSIS — I10 Essential (primary) hypertension: Secondary | ICD-10-CM

## 2015-07-14 MED ORDER — HYDRALAZINE HCL 50 MG PO TABS: 50.0000 mg | ORAL_TABLET | Freq: Two times a day (BID) | ORAL | Status: DC

## 2015-07-14 NOTE — Progress Notes (Signed)
Patient ID: Antonio Watkins, male   DOB: [birth year], 81 y.o.   MRN: [id number]      SUBJECTIVE: Antonio Watkins presents for follow up of chest pain in the context of CAD. He has a history of coronary artery disease with Multiple-Link Tetra stents to the RCA and circumflex in 2000 and 2002 respectively.  He had a stress echocardiogram in April 2014 which was essentially low risk and negative for ischemia. He also has diabetes, essential hypertension and hyperlipidemia.  His chest pain has been well controlled on Imdur. He says his blood pressure is always elevated, both at church where a nurse checks it every Monday and at his PCP's office.  He and his wife recently celebrated their 68th wedding anniversary in [place] , and his children held a big party for them.  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 He knew all the Steelers from "[place]" era, including Antonio Watkins, Antonio Watkins, Antonio Watkins, Antonio Watkins, and Antonio Watkins. 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   Review of Systems: As per "subjective", otherwise negative.  Allergies  Allergen Reactions  . Morphine And Related Swelling    Current Outpatient Prescriptions  Medication Sig Dispense Refill  . aspirin 81 MG tablet Take 81 mg by mouth daily.    Marland Kitchen atorvastatin (LIPITOR) 40 MG tablet Take 1.5 tablets (60 mg total) by mouth daily. 135 tablet 3  . carvedilol (COREG) 12.5 MG tablet Take 12.5 mg by mouth 2 (two) times daily with a meal.    . cetirizine (ZYRTEC) 10 MG tablet Take 10 mg by mouth as needed.     . diclofenac (VOLTAREN) 75 MG EC tablet Take 75 mg by mouth 2 (two) times daily.    Marland Kitchen docusate sodium (COLACE) 100 MG capsule Take 100 mg by mouth 2 (two) times daily.    Marland Kitchen gabapentin (NEURONTIN) 300 MG capsule Take 300 mg by mouth at bedtime.    . hydrALAZINE (APRESOLINE) 25 MG  tablet Take 1 tablet (25 mg total) by mouth 2 (two) times daily. 180 tablet 3  . indomethacin (INDOCIN SR) 75 MG CR capsule Take 75 mg by mouth 2 (two) times daily as needed.    . insulin NPH-regular (NOVOLIN 70/30) (70-30) 100 UNIT/ML injection Inject 5 Units into the skin 2 (two) times daily as needed.     . isosorbide mononitrate (IMDUR) 60 MG 24 hr tablet Take 1 tablet (60 mg total) by mouth daily. 90 tablet 3  . losartan (COZAAR) 100 MG tablet Take 100 mg by mouth daily.    . metFORMIN (GLUCOPHAGE) 500 MG tablet Take 1 tablet by mouth 2 (two) times daily.    . Multiple Vitamin (MULTIVITAMIN) tablet Take 1 tablet by mouth daily.    . nitroGLYCERIN (NITROSTAT) 0.4 MG SL tablet Place 1 tablet (0.4 mg total) under the tongue every 5 (five) minutes as needed for chest pain. 25 tablet 3  . Omega-3 Fatty Acids (OMEGA-3 FISH OIL) 1200 MG CAPS Take 1 capsule by mouth 2 (two) times daily.    Marland Kitchen omeprazole (PRILOSEC) 40 MG capsule Take 40 mg by mouth daily.    . polyethylene glycol powder (GLYCOLAX/MIRALAX) powder Take 17 g by mouth daily.    . Potassium Gluconate 595 MG CAPS Take 1 capsule by mouth 2 (two) times daily.    . sodium chloride (OCEAN)  0.65 % SOLN nasal spray Place 1 spray into both nostrils as needed for congestion.     No current facility-administered medications for this visit.    Past Medical History  Diagnosis Date  . DM type 2 (diabetes mellitus, type 2)   . CAD (coronary artery disease)   . Hyperlipemia   . GERD (gastroesophageal reflux disease)   . Hiatal hernia   . Gout   . Incontinence   . Cancer of sigmoid colon     No past surgical history on file.  Social History   Social History  . Marital Status: Married    Spouse Name: N/A  . Number of Children: N/A  . Years of Education: N/A   Occupational History  . Not on file.   Social History Main Topics  . Smoking status: Former Smoker -- 1.00 packs/day for 20 years    Types: Cigars    Start date: 07/18/1993     Quit date: 07/18/2013  . Smokeless tobacco: Never Used     Comment: Former cigarette smoker for 40 years prior to smoking cigars  . Alcohol Use: 0.0 oz/week    0 Standard drinks or equivalent per week     Comment: Socially  . Drug Use: Not on file  . Sexual Activity: Not on file   Other Topics Concern  . Not on file   Social History Narrative     Filed Vitals:   07/14/15 1525  BP: 169/74  Pulse: 56  Height: 5\' 10"  (1.778 m)  Weight: 232 lb (105.235 kg)    PHYSICAL EXAM General: NAD HEENT: Normal. Neck: No JVD, no thyromegaly. Lungs: Clear to auscultation bilaterally with normal respiratory effort. CV: Nondisplaced PMI.  Regular rate and rhythm, normal S1/S2, no S3/S4, no murmur. No pretibial or periankle edema.   Abdomen: Soft, nontender, no distention.  Neurologic: Alert and oriented x 3.  Psych: Normal affect. Skin: Normal. Musculoskeletal: No gross deformities. Extremities: No clubbing or cyanosis.   ECG: Most recent ECG reviewed.      ASSESSMENT AND PLAN: 1. Chest pain with CAD: Relatively stable ischemic heart disease. Continue aspirin, atenolol, Imdur 60 mg daily, and Lipitor.   2. Essential HTN: Markedly elevated on amlodipine 10 mg, atenolol 25 mg twice daily, hydralazine 25 mg twice daily, and losartan 100 mg daily. Will increase hydralazine to 50 mg bid.  3. Hypercholesterolemia: Lipids on 08/26/14 showed TC 163, TG 146, HDL 43, LDL 91. No changes to Lipitor. Will see if lipids were recently checked by PCP.  Dispo: f/u 3 months.   Kate Sable, M.D., F.A.C.C.

## 2015-07-14 NOTE — Patient Instructions (Signed)
Your physician has recommended you make the following change in your medication:  Increase hydralazine to 50 mg twice daily. You may take (2) of your 25 mg tablets twice daily until they are finished. Continue all other medications the same. Your physician recommends that you schedule a follow-up appointment in: 3 months.

## 2015-10-14 ENCOUNTER — Encounter: Payer: Self-pay | Admitting: Cardiovascular Disease

## 2015-10-14 ENCOUNTER — Ambulatory Visit (INDEPENDENT_AMBULATORY_CARE_PROVIDER_SITE_OTHER): Payer: Medicare PPO | Admitting: Cardiovascular Disease

## 2015-10-14 VITALS — BP 158/74 | HR 74 | Ht 70.0 in | Wt 239.0 lb

## 2015-10-14 DIAGNOSIS — I209 Angina pectoris, unspecified: Secondary | ICD-10-CM

## 2015-10-14 DIAGNOSIS — E785 Hyperlipidemia, unspecified: Secondary | ICD-10-CM | POA: Diagnosis not present

## 2015-10-14 DIAGNOSIS — I10 Essential (primary) hypertension: Secondary | ICD-10-CM

## 2015-10-14 DIAGNOSIS — I25118 Atherosclerotic heart disease of native coronary artery with other forms of angina pectoris: Secondary | ICD-10-CM | POA: Diagnosis not present

## 2015-10-14 DIAGNOSIS — Z955 Presence of coronary angioplasty implant and graft: Secondary | ICD-10-CM

## 2015-10-14 MED ORDER — CARVEDILOL 25 MG PO TABS: 25.0000 mg | ORAL_TABLET | Freq: Two times a day (BID) | ORAL | Status: DC

## 2015-10-14 MED ORDER — NITROGLYCERIN 0.4 MG SL SUBL: 0.4000 mg | SUBLINGUAL_TABLET | SUBLINGUAL | Status: DC | PRN

## 2015-10-14 NOTE — Patient Instructions (Signed)
   Increase Coreg to 25mg  twice a day  - new sent to [facility] for mail order - 90 day supply.  You may take 2 tabs of your 12.5mg  tablet twice a day till finish current supply. Continue all other medications.   Follow up in  4 months

## 2015-10-14 NOTE — Progress Notes (Signed)
Patient ID: Antonio Watkins, male   DOB: 1930/03/29, 81 y.o.   MRN: [id number]      SUBJECTIVE: Antonio Watkins presents for follow up of chest pain in the context of CAD. He has a history of coronary artery disease with Multiple-Link Tetra stents to the RCA and circumflex in 2000 and 2002 respectively.  He had a stress echocardiogram in April 2014 which was essentially low risk and negative for ischemia. He also has diabetes, essential hypertension and hyperlipidemia.  His chest pain has been relatively well controlled on Imdur.  He's had 2 episodes in the last month. Has occasional left arm numbness. Has bilateral knee pain. Had knee replacement surgery in [place] over 20 years ago.  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 He has also lived in [place]. He knew all the Steelers from "[place], including Young Berry, Gary Fleet, Carlisle Cater, Leanna Battles, and Mean Welford Roche. 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 Married for over 55 years.   Review of Systems: As per "subjective", otherwise negative.  Allergies  Allergen Reactions  . Morphine And Related Swelling    Current Outpatient Prescriptions  Medication Sig Dispense Refill  . aspirin 81 MG tablet Take 81 mg by mouth daily.    Marland Kitchen atorvastatin (LIPITOR) 40 MG tablet Take 1.5 tablets (60 mg total) by mouth daily. 135 tablet 3  . carvedilol (COREG) 12.5 MG tablet Take 12.5 mg by mouth 2 (two) times daily with a meal.    . cetirizine (ZYRTEC) 10 MG tablet Take 10 mg by mouth as needed.     . diclofenac (VOLTAREN) 75 MG EC tablet Take 75 mg by mouth 2 (two) times daily.    Marland Kitchen docusate sodium (COLACE) 100 MG capsule Take 100 mg by mouth 2 (two) times daily.    Marland Kitchen gabapentin (NEURONTIN) 300 MG capsule Take 300 mg by mouth at bedtime.    . hydrALAZINE (APRESOLINE) 50 MG tablet Take 1 tablet (50  mg total) by mouth 2 (two) times daily. 180 tablet 3  . indomethacin (INDOCIN SR) 75 MG CR capsule Take 75 mg by mouth 2 (two) times daily as needed.    . insulin NPH-regular (NOVOLIN 70/30) (70-30) 100 UNIT/ML injection Inject 5 Units into the skin 2 (two) times daily as needed.     . isosorbide mononitrate (IMDUR) 60 MG 24 hr tablet Take 1 tablet (60 mg total) by mouth daily. 90 tablet 3  . losartan (COZAAR) 100 MG tablet Take 100 mg by mouth daily.    . metFORMIN (GLUCOPHAGE) 500 MG tablet Take 1 tablet by mouth 2 (two) times daily.    . Multiple Vitamin (MULTIVITAMIN) tablet Take 1 tablet by mouth daily.    . nitroGLYCERIN (NITROSTAT) 0.4 MG SL tablet Place 1 tablet (0.4 mg total) under the tongue every 5 (five) minutes as needed for chest pain. 25 tablet 3  . Omega-3 Fatty Acids (OMEGA-3 FISH OIL) 1200 MG CAPS Take 1 capsule by mouth 2 (two) times daily.    Marland Kitchen omeprazole (PRILOSEC) 40 MG capsule Take 40 mg by mouth daily.    . polyethylene glycol powder (GLYCOLAX/MIRALAX) powder Take 17 g by mouth daily.    . Potassium Gluconate 595 MG CAPS Take 1 capsule by mouth 2 (two) times daily.    . sodium chloride (OCEAN) 0.65 % SOLN nasal spray Place  1 spray into both nostrils as needed for congestion.     No current facility-administered medications for this visit.    Past Medical History  Diagnosis Date  . DM type 2 (diabetes mellitus, type 2) ([place])   . CAD (coronary artery disease)   . Hyperlipemia   . GERD (gastroesophageal reflux disease)   . Hiatal hernia   . Gout   . Incontinence   . Cancer of sigmoid colon ([place])     No past surgical history on file.  Social History   Social History  . Marital Status: Married    Spouse Name: N/A  . Number of Children: N/A  . Years of Education: N/A   Occupational History  . Not on file.   Social History Main Topics  . Smoking status: Former Smoker -- 1.00 packs/day for 20 years    Types: Cigars    Start date: 07/18/1993    Quit date:  07/18/2013  . Smokeless tobacco: Never Used     Comment: Former cigarette smoker for 40 years prior to smoking cigars  . Alcohol Use: 0.0 oz/week    0 Standard drinks or equivalent per week     Comment: Socially  . Drug Use: Not on file  . Sexual Activity: Not on file   Other Topics Concern  . Not on file   Social History Narrative     Filed Vitals:   10/14/15 1542  BP: 158/74  Pulse: 74  Height: 5\' 10"  (1.778 m)  Weight: 239 lb (108.41 kg)  SpO2: 94%    PHYSICAL EXAM General: NAD HEENT: Normal. Neck: No JVD, no thyromegaly. Lungs: Clear to auscultation bilaterally with normal respiratory effort. CV: Nondisplaced PMI. Regular rate and rhythm, normal S1/S2, no S3/S4, no murmur. Trace periankle edema.  Abdomen: Soft, nontender, obese, no distention.  Neurologic: Alert and oriented x 3.  Psych: Normal affect. Skin: Normal. Musculoskeletal: No gross deformities. Extremities: No clubbing or cyanosis.   ECG: Most recent ECG reviewed.      ASSESSMENT AND PLAN: 1. Chest pain with CAD: Relatively stable ischemic heart disease. Continue aspirin, Imdur 60 mg daily, and Lipitor. Increase Coreg to 25 mg bid for both BP control and antianginal benefit.  2. Essential HTN: Mildly elevated on Coreg 12.5 mg twice daily, hydralazine 50 mg twice daily, and losartan 100 mg daily. No longer on amlodipine. Will increase Coreg to 25 mg bid.  3. Hypercholesterolemia: Lipids on 08/26/14 showed TC 163, TG 146, HDL 43, LDL 91. No changes to Lipitor. Will see if lipids were checked by PCP this year.  Dispo: f/u 4 months.   Kate Sable, M.D., F.A.C.C.

## 2015-10-21 ENCOUNTER — Encounter: Payer: Self-pay | Admitting: *Deleted

## 2015-12-10 ENCOUNTER — Other Ambulatory Visit: Payer: Self-pay | Admitting: Cardiovascular Disease

## 2016-02-09 ENCOUNTER — Ambulatory Visit (INDEPENDENT_AMBULATORY_CARE_PROVIDER_SITE_OTHER): Payer: Medicare PPO | Admitting: Cardiovascular Disease

## 2016-02-09 ENCOUNTER — Ambulatory Visit: Payer: Medicare PPO | Admitting: Cardiovascular Disease

## 2016-02-09 ENCOUNTER — Encounter: Payer: Self-pay | Admitting: Cardiovascular Disease

## 2016-02-09 VITALS — BP 159/83 | HR 66 | Ht 70.0 in | Wt 244.0 lb

## 2016-02-09 DIAGNOSIS — I10 Essential (primary) hypertension: Secondary | ICD-10-CM

## 2016-02-09 DIAGNOSIS — E785 Hyperlipidemia, unspecified: Secondary | ICD-10-CM

## 2016-02-09 DIAGNOSIS — Z955 Presence of coronary angioplasty implant and graft: Secondary | ICD-10-CM | POA: Diagnosis not present

## 2016-02-09 DIAGNOSIS — I25118 Atherosclerotic heart disease of native coronary artery with other forms of angina pectoris: Secondary | ICD-10-CM

## 2016-02-09 MED ORDER — HYDRALAZINE HCL 50 MG PO TABS: 75.0000 mg | ORAL_TABLET | Freq: Three times a day (TID) | ORAL | Status: DC

## 2016-02-09 NOTE — Patient Instructions (Addendum)
   Increase Hydralazine to 75mg  three times per day (1 1/2 tabs of the 50mg  tablet) - new 90 day sent to [facility] mail order today. Continue all other medications.   Your physician wants you to follow up in: 6 months.  You will receive a reminder letter in the mail one-two months in advance.  If you don't receive a letter, please call our office to schedule the follow up appointment

## 2016-02-09 NOTE — Progress Notes (Signed)
Patient ID: Kermit Rachal, male   DOB: 1929-11-14, 82 y.o.   MRN: [id number]      SUBJECTIVE: Mr. Hogan presents for follow up of chest pain in the context of CAD. He has a history of coronary artery disease with Multiple-Link Tetra stents to the RCA and circumflex in 2000 and 2002 respectively.  He had a stress echocardiogram in April 2014 which was essentially low risk and negative for ischemia. He also has diabetes, essential hypertension and hyperlipidemia.  His chest pain has been relatively well controlled on Imdur.  Has bilateral knee pain. Had knee replacement surgery in [place] over 20 years ago. Doesn't walk much due to this, but does quite a bit of strength training without difficulty.  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 He has also lived in [place]. He knew all the Steelers from "[place] era, including Young Berry, Gary Fleet, Carlisle Cater, Leanna Battles, and Mean Welford Roche. 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 Married for over 55 years.    Review of Systems: As per "subjective", otherwise negative.  Allergies  Allergen Reactions  . Morphine And Related Swelling    Current Outpatient Prescriptions  Medication Sig Dispense Refill  . aspirin 81 MG tablet Take 81 mg by mouth daily.    Marland Kitchen atorvastatin (LIPITOR) 40 MG tablet TAKE 1 AND 1/2 TABLETS EVERY DAY (DOSE INCREASED 06/13/2014) 135 tablet 3  . carvedilol (COREG) 25 MG tablet Take 1 tablet (25 mg total) by mouth 2 (two) times daily. 180 tablet 3  . cetirizine (ZYRTEC) 10 MG tablet Take 10 mg by mouth as needed.     . diclofenac (VOLTAREN) 75 MG EC tablet Take 75 mg by mouth 2 (two) times daily.    Marland Kitchen docusate sodium (COLACE) 100 MG capsule Take 100 mg by mouth 2 (two) times daily.    Marland Kitchen gabapentin (NEURONTIN) 300 MG capsule Take 300 mg by mouth at bedtime.    .  hydrALAZINE (APRESOLINE) 50 MG tablet Take 1 tablet (50 mg total) by mouth 2 (two) times daily. 180 tablet 3  . indomethacin (INDOCIN SR) 75 MG CR capsule Take 75 mg by mouth 2 (two) times daily as needed.    . insulin NPH-regular (NOVOLIN 70/30) (70-30) 100 UNIT/ML injection Inject 5 Units into the skin 2 (two) times daily as needed.     . isosorbide mononitrate (IMDUR) 60 MG 24 hr tablet Take 1 tablet (60 mg total) by mouth daily. 90 tablet 3  . losartan (COZAAR) 100 MG tablet Take 100 mg by mouth daily.    . metFORMIN (GLUCOPHAGE) 500 MG tablet Take 1 tablet by mouth 2 (two) times daily.    . Multiple Vitamin (MULTIVITAMIN) tablet Take 1 tablet by mouth daily.    . nitroGLYCERIN (NITROSTAT) 0.4 MG SL tablet Place 1 tablet (0.4 mg total) under the tongue every 5 (five) minutes as needed for chest pain. 25 tablet 3  . Omega-3 Fatty Acids (OMEGA-3 FISH OIL) 1200 MG CAPS Take 1 capsule by mouth 2 (two) times daily.    Marland Kitchen omeprazole (PRILOSEC) 40 MG capsule Take 40 mg by mouth daily.    . polyethylene glycol powder (GLYCOLAX/MIRALAX) powder Take 17 g by mouth daily.    . Potassium Gluconate 595 MG CAPS Take 1 capsule by mouth 2 (two) times daily.    . sodium chloride (OCEAN)  0.65 % SOLN nasal spray Place 1 spray into both nostrils as needed for congestion.     No current facility-administered medications for this visit.    Past Medical History  Diagnosis Date  . DM type 2 (diabetes mellitus, type 2) ([place])   . CAD (coronary artery disease)   . Hyperlipemia   . GERD (gastroesophageal reflux disease)   . Hiatal hernia   . Gout   . Incontinence   . Cancer of sigmoid colon ([place])     No past surgical history on file.  Social History   Social History  . Marital Status: Married    Spouse Name: N/A  . Number of Children: N/A  . Years of Education: N/A   Occupational History  . Not on file.   Social History Main Topics  . Smoking status: Current Some Day Smoker -- 1.00 packs/day for 20  years    Types: Cigars    Start date: 07/18/1993  . Smokeless tobacco: Never Used     Comment: 02/09/16 patient reported that he smokes one cigar per day/Former cigarette smoker for 40 years prior to smoking cigars  . Alcohol Use: 0.0 oz/week    0 Standard drinks or equivalent per week     Comment: Socially  . Drug Use: Not on file  . Sexual Activity: Not on file   Other Topics Concern  . Not on file   Social History Narrative     Filed Vitals:   02/09/16 1501  BP: 159/83  Pulse: 66  Height: 5\' 10"  (1.778 m)  Weight: 244 lb (110.678 kg)    PHYSICAL EXAM General: NAD HEENT: Normal. Neck: No JVD, no thyromegaly. Lungs: Clear to auscultation bilaterally with normal respiratory effort. CV: Nondisplaced PMI. Regular rate and rhythm, normal S1/S2, no S3/S4, no murmur. Trace periankle edema.  Abdomen: Soft, nontender, obese, no distention.  Neurologic: Alert and oriented x 3.  Psych: Normal affect. Skin: Normal. Musculoskeletal: No gross deformities. Extremities: No clubbing or cyanosis.   ECG: Most recent ECG reviewed.      ASSESSMENT AND PLAN: 1. Chest pain with CAD: Relatively stable ischemic heart disease. Continue aspirin, Coreg, Imdur 60 mg daily, and Lipitor.  2. Essential HTN: Elevated. Will increase hydralazine to 75 mg BID (won't take it TID).  3. Hypercholesterolemia: Lipids on 11/04/15 showed total cholesterol 160, HDL 43, LDL 88, triglycerides 142. For the time being, will continue Lipitor 40 mg.  Dispo: f/u 6 months.   Kate Sable, M.D., F.A.C.C.

## 2016-02-21 ENCOUNTER — Other Ambulatory Visit: Payer: Self-pay | Admitting: Cardiovascular Disease

## 2016-03-19 ENCOUNTER — Encounter: Payer: Self-pay | Admitting: Cardiovascular Disease

## 2016-03-19 ENCOUNTER — Ambulatory Visit (INDEPENDENT_AMBULATORY_CARE_PROVIDER_SITE_OTHER): Payer: Medicare PPO | Admitting: Cardiovascular Disease

## 2016-03-19 VITALS — BP 144/72 | HR 64 | Ht 70.0 in | Wt 244.0 lb

## 2016-03-19 DIAGNOSIS — I10 Essential (primary) hypertension: Secondary | ICD-10-CM | POA: Diagnosis not present

## 2016-03-19 DIAGNOSIS — R079 Chest pain, unspecified: Secondary | ICD-10-CM | POA: Diagnosis not present

## 2016-03-19 DIAGNOSIS — E785 Hyperlipidemia, unspecified: Secondary | ICD-10-CM

## 2016-03-19 DIAGNOSIS — Z955 Presence of coronary angioplasty implant and graft: Secondary | ICD-10-CM

## 2016-03-19 DIAGNOSIS — I25118 Atherosclerotic heart disease of native coronary artery with other forms of angina pectoris: Secondary | ICD-10-CM

## 2016-03-19 NOTE — Progress Notes (Signed)
Patient ID: Antonio Watkins, male   DOB: 1930-05-27, 82 y.o.   MRN: [id number]      SUBJECTIVE: Antonio Watkins presents for follow up of chest pain in the context of CAD. He has a history of coronary artery disease with Multiple-Link Tetra stents to the RCA and circumflex in 2000 and 2002 respectively.  He had a stress echocardiogram in April 2014 which was essentially low risk and negative for ischemia. He also has diabetes, essential hypertension and hyperlipidemia.  His chest pain had been relatively well controlled on Imdur.  Has bilateral knee pain. Had knee replacement surgery in [place] over 20 years ago. Doesn't walk much due to this, but does quite a bit of strength training without difficulty.  When I saw him on 02/09/16 he was symptomatically stable.  I reviewed notes from his PCP which describes 4 episodes of sharp upper left sided chest pain lasting less than a second within one week. He had no chest pain at his PCPs office.  He has had no further episodes. He goes to the [facility] twice a week and does 100 pound leg presses and 65 pound dumbbell curls without any chest pain whatsoever.  ECG performed in the office today which I personally interpreted demonstrated sinus rhythm with a nonspecific T wave abnormality and sinus arrhythmia with first-degree AV block and occasional PVCs.   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 He knew all the Steelers from "[place] era, including Young Berry, Gary Fleet, Carlisle Cater, Leanna Battles, and Mean Welford Roche. 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 Married for over 55 years.   Review of Systems: As per "subjective", otherwise negative.  Allergies  Allergen Reactions  . Morphine And Related Swelling    Current Outpatient Prescriptions  Medication Sig Dispense Refill    . aspirin 81 MG tablet Take 81 mg by mouth daily.    Marland Kitchen atorvastatin (LIPITOR) 40 MG tablet TAKE 1 AND 1/2 TABLETS EVERY DAY (DOSE INCREASED 06/13/2014) 135 tablet 3  . carvedilol (COREG) 25 MG tablet Take 1 tablet (25 mg total) by mouth 2 (two) times daily. 180 tablet 3  . cetirizine (ZYRTEC) 10 MG tablet Take 10 mg by mouth as needed.     . diclofenac (VOLTAREN) 75 MG EC tablet Take 75 mg by mouth 2 (two) times daily.    Marland Kitchen docusate sodium (COLACE) 100 MG capsule Take 100 mg by mouth 2 (two) times daily.    Marland Kitchen gabapentin (NEURONTIN) 300 MG capsule Take 300 mg by mouth at bedtime.    . hydrALAZINE (APRESOLINE) 50 MG tablet Take 1.5 tablets (75 mg total) by mouth 3 (three) times daily. 405 tablet 3  . indomethacin (INDOCIN SR) 75 MG CR capsule Take 75 mg by mouth 2 (two) times daily as needed.    . insulin NPH-regular (NOVOLIN 70/30) (70-30) 100 UNIT/ML injection Inject 5 Units into the skin 2 (two) times daily as needed.     . isosorbide mononitrate (IMDUR) 60 MG 24 hr tablet TAKE 1 TABLET  DAILY (DOSE INCREASED) 90 tablet 3  . losartan (COZAAR) 100 MG tablet Take 100 mg by mouth daily.    . metFORMIN (GLUCOPHAGE) 500 MG tablet Take 1 tablet by mouth 2 (two) times daily.    . Multiple Vitamin (MULTIVITAMIN) tablet Take 1 tablet by mouth daily.    Marland Kitchen  nitroGLYCERIN (NITROSTAT) 0.4 MG SL tablet Place 1 tablet (0.4 mg total) under the tongue every 5 (five) minutes as needed for chest pain. 25 tablet 3  . Omega-3 Fatty Acids (OMEGA-3 FISH OIL) 1200 MG CAPS Take 1 capsule by mouth 2 (two) times daily.    Marland Kitchen omeprazole (PRILOSEC) 40 MG capsule Take 40 mg by mouth daily.    . polyethylene glycol powder (GLYCOLAX/MIRALAX) powder Take 17 g by mouth daily.    . Potassium Gluconate 595 MG CAPS Take 1 capsule by mouth 2 (two) times daily.    . sodium chloride (OCEAN) 0.65 % SOLN nasal spray Place 1 spray into both nostrils as needed for congestion.     No current facility-administered medications for this  visit.    Past Medical History  Diagnosis Date  . DM type 2 (diabetes mellitus, type 2) ([place])   . CAD (coronary artery disease)   . Hyperlipemia   . GERD (gastroesophageal reflux disease)   . Hiatal hernia   . Gout   . Incontinence   . Cancer of sigmoid colon ([place])     No past surgical history on file.  Social History   Social History  . Marital Status: Married    Spouse Name: N/A  . Number of Children: N/A  . Years of Education: N/A   Occupational History  . Not on file.   Social History Main Topics  . Smoking status: Current Some Day Smoker -- 1.00 packs/day for 20 years    Types: Cigars    Start date: 07/18/1993  . Smokeless tobacco: Never Used     Comment: 02/09/16 patient reported that he smokes one cigar per day/Former cigarette smoker for 40 years prior to smoking cigars  . Alcohol Use: 0.0 oz/week    0 Standard drinks or equivalent per week     Comment: Socially  . Drug Use: Not on file  . Sexual Activity: Not on file   Other Topics Concern  . Not on file   Social History Narrative     Filed Vitals:   03/19/16 1606  BP: 144/72  Pulse: 64  Height: 5\' 10"  (1.778 m)  Weight: 244 lb (110.678 kg)  SpO2: 94%    PHYSICAL EXAM General: NAD HEENT: Normal. Neck: No JVD, no thyromegaly. Lungs: Clear to auscultation bilaterally with normal respiratory effort. CV: Nondisplaced PMI. Regular rate and rhythm, normal S1/S2, no S3/S4, no murmur. Trace periankle edema.  Abdomen: Soft, nontender, obese, no distention.  Neurologic: Alert and oriented x 3.  Psych: Normal affect. Skin: Normal. Musculoskeletal: No gross deformities. Extremities: No clubbing or cyanosis.    ECG: Most recent ECG reviewed.      ASSESSMENT AND PLAN: 1. Chest pain with CAD: Relatively stable ischemic heart disease. Given the fact that these episodes of chest pain lasted less than a second and he denies any exertional component whatsoever, I feel it is unlikely related to  ischemic heart disease. If he were to develop frequent recurrences associated with exertion, I would then pursue stress testing. His last stress test was in April 2014. Continue aspirin, Coreg, Imdur 60 mg daily, and Lipitor.  2. Essential HTN: Elevated but improved since last visit. No changes to therapy.  3. Hypercholesterolemia: Lipids on 11/04/15 showed total cholesterol 160, HDL 43, LDL 88, triglycerides 142. For the time being, will continue Lipitor 40 mg.  Dispo: f/u October 2017 or earlier should symptoms worsen.   Kate Sable, M.D., F.A.C.C.

## 2016-03-19 NOTE — Patient Instructions (Signed)
Continue all other medications.   Your physician wants you to follow up in:  4 months.  You will receive a reminder letter in the mail one-two months in advance.  If you don't receive a letter, please call our office to schedule the follow up appointment

## 2016-07-30 ENCOUNTER — Encounter: Payer: Self-pay | Admitting: Cardiovascular Disease

## 2016-07-30 ENCOUNTER — Ambulatory Visit (INDEPENDENT_AMBULATORY_CARE_PROVIDER_SITE_OTHER): Payer: Medicare PPO | Admitting: Cardiovascular Disease

## 2016-07-30 VITALS — BP 143/81 | HR 60 | Ht 70.0 in | Wt 235.4 lb

## 2016-07-30 DIAGNOSIS — Z955 Presence of coronary angioplasty implant and graft: Secondary | ICD-10-CM | POA: Diagnosis not present

## 2016-07-30 DIAGNOSIS — I25119 Atherosclerotic heart disease of native coronary artery with unspecified angina pectoris: Secondary | ICD-10-CM | POA: Diagnosis not present

## 2016-07-30 DIAGNOSIS — I10 Essential (primary) hypertension: Secondary | ICD-10-CM

## 2016-07-30 DIAGNOSIS — I208 Other forms of angina pectoris: Secondary | ICD-10-CM

## 2016-07-30 DIAGNOSIS — E7800 Pure hypercholesterolemia, unspecified: Secondary | ICD-10-CM | POA: Diagnosis not present

## 2016-07-30 NOTE — Progress Notes (Signed)
SUBJECTIVE: [name] presents for follow up of chest pain in the context of CAD. He has a history of coronary artery disease with Multiple-Link Tetra stents to the RCA and circumflex in 2000 and 2002 respectively.  He had a stress echocardiogram in April 2014 which was essentially low risk and negative for ischemia. He also has diabetes, essential hypertension and hyperlipidemia.  His chest pain had been relatively well controlled on Imdur.  Has bilateral knee pain. Had knee replacement surgery in [place] over 20 years ago. Doesn't walk much due to this, but does quite a bit of strength training without difficulty.  Had a sharp chest pain a month ago which did not require nitro. Recently built a Dance movement psychotherapist. Watches old westerns on TV.   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 He knew all the Steelers from "[place] era, including Young Berry, Gary Fleet, Carlisle Cater, Leanna Battles, and Mean Welford Roche. 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 Married for over 55 years.  Review of Systems: As per "subjective", otherwise negative.  Allergies  Allergen Reactions  . Morphine And Related Swelling    Current Outpatient Prescriptions  Medication Sig Dispense Refill  . aspirin 81 MG tablet Take 81 mg by mouth daily.    Marland Kitchen atorvastatin (LIPITOR) 40 MG tablet TAKE 1 AND 1/2 TABLETS EVERY DAY (DOSE INCREASED 06/13/2014) 135 tablet 3  . carvedilol (COREG) 25 MG tablet Take 1 tablet (25 mg total) by mouth 2 (two) times daily. 180 tablet 3  . cetirizine (ZYRTEC) 10 MG tablet Take 10 mg by mouth as needed.     . diclofenac (VOLTAREN) 75 MG EC tablet Take 75 mg by mouth 2 (two) times daily.    Marland Kitchen docusate sodium (COLACE) 100 MG capsule Take 100 mg by mouth 2 (two) times daily.    Marland Kitchen gabapentin (NEURONTIN) 300 MG capsule  Take 300 mg by mouth at bedtime.    . hydrALAZINE (APRESOLINE) 50 MG tablet Take 1.5 tablets (75 mg total) by mouth 3 (three) times daily. 405 tablet 3  . indomethacin (INDOCIN SR) 75 MG CR capsule Take 75 mg by mouth 2 (two) times daily as needed.    . insulin NPH-regular (NOVOLIN 70/30) (70-30) 100 UNIT/ML injection Inject 5 Units into the skin 2 (two) times daily as needed.     . isosorbide mononitrate (IMDUR) 60 MG 24 hr tablet TAKE 1 TABLET  DAILY (DOSE INCREASED) 90 tablet 3  . losartan (COZAAR) 100 MG tablet Take 100 mg by mouth daily.    . metFORMIN (GLUCOPHAGE) 500 MG tablet Take 1 tablet by mouth 2 (two) times daily.    . Multiple Vitamin (MULTIVITAMIN) tablet Take 1 tablet by mouth daily.    . nitroGLYCERIN (NITROSTAT) 0.4 MG SL tablet Place 1 tablet (0.4 mg total) under the tongue every 5 (five) minutes as needed for chest pain. 25 tablet 3  . Omega-3 Fatty Acids (OMEGA-3 FISH OIL) 1200 MG CAPS Take 1 capsule by mouth 2 (two) times daily.    Marland Kitchen omeprazole (PRILOSEC) 40 MG capsule Take 40 mg by mouth daily.    . polyethylene glycol powder (GLYCOLAX/MIRALAX) powder Take 17 g by mouth daily.    . Potassium Gluconate 595 MG CAPS Take 1 capsule by mouth 2 (two) times daily.    Marland Kitchen  sodium chloride (OCEAN) 0.65 % SOLN nasal spray Place 1 spray into both nostrils as needed for congestion.     No current facility-administered medications for this visit.     Past Medical History:  Diagnosis Date  . CAD (coronary artery disease)   . Cancer of sigmoid colon ([place])   . DM type 2 (diabetes mellitus, type 2) ([place])   . GERD (gastroesophageal reflux disease)   . Gout   . Hiatal hernia   . Hyperlipemia   . Incontinence     History reviewed. No pertinent surgical history.  Social History   Social History  . Marital status: Married    Spouse name: N/A  . Number of children: N/A  . Years of education: N/A   Occupational History  . Not on file.   Social History Main Topics  . Smoking  status: Current Some Day Smoker    Packs/day: 1.00    Years: 20.00    Types: Cigars    Start date: 07/18/1993  . Smokeless tobacco: Never Used     Comment: 02/09/16 patient reported that he smokes one cigar per day/Former cigarette smoker for 40 years prior to smoking cigars  . Alcohol use 0.0 oz/week     Comment: Socially  . Drug use: Unknown  . Sexual activity: Not on file   Other Topics Concern  . Not on file   Social History Narrative  . No narrative on file     Vitals:   07/30/16 1142  BP: (!) 143/81  Pulse: 60  SpO2: 97%  Weight: 235 lb 6.4 oz (106.8 kg)  Height: 5\' 10"  (1.778 m)    PHYSICAL EXAM General: NAD HEENT: Normal. Neck: No JVD, no thyromegaly. Lungs: Clear to auscultation bilaterally with normal respiratory effort. CV: Nondisplaced PMI. Regular rate and rhythm, normal S1/S2, no S3/S4, no murmur. Trace periankle edema.  Abdomen: Soft, nontender, obese, no distention.  Neurologic: Alert and oriented x 3.  Psych: Normal affect. Skin: Normal. Musculoskeletal: No gross deformities. Extremities: No clubbing or cyanosis.     ECG: Most recent ECG reviewed.      ASSESSMENT AND PLAN: 1. Chest pain with CAD: Relatively stable ischemic heart disease. Given the fact that these episodes of chest pain lasted less than a second and he denies any exertional component whatsoever, I feel it is unlikely related to ischemic heart disease. If he were to develop frequent recurrences associated with exertion, I would then pursue stress testing. His last stress test was in April 2014. Continue aspirin, Coreg, Imdur 60 mg daily, and Lipitor.  2. Essential HTN: Mildly elevated. No changes to therapy.  3. Hypercholesterolemia: Lipids on 11/04/15 showed total cholesterol 160, HDL 43, LDL 88, triglycerides 142. For the time being, will continue Lipitor 40 mg.  Dispo: fu 3 months.  Kate Sable, M.D., F.A.C.C.

## 2016-07-30 NOTE — Patient Instructions (Signed)
Medication Instructions:  Continue all current medications.  Labwork: none  Testing/Procedures: none  Follow-Up: 3 months   Any Other Special Instructions Will Be Listed Below (If Applicable).  If you need a refill on your cardiac medications before your next appointment, please call your pharmacy.  

## 2016-11-01 ENCOUNTER — Ambulatory Visit: Payer: Medicare PPO | Admitting: Cardiovascular Disease

## 2016-11-14 ENCOUNTER — Other Ambulatory Visit: Payer: Self-pay | Admitting: Cardiovascular Disease

## 2016-11-19 ENCOUNTER — Ambulatory Visit: Payer: Medicare PPO | Admitting: Cardiovascular Disease

## 2016-11-29 ENCOUNTER — Ambulatory Visit (INDEPENDENT_AMBULATORY_CARE_PROVIDER_SITE_OTHER): Payer: Medicare PPO | Admitting: Cardiovascular Disease

## 2016-11-29 ENCOUNTER — Encounter: Payer: Self-pay | Admitting: Cardiovascular Disease

## 2016-11-29 VITALS — BP 146/78 | HR 64 | Ht 70.0 in | Wt 232.0 lb

## 2016-11-29 DIAGNOSIS — E7800 Pure hypercholesterolemia, unspecified: Secondary | ICD-10-CM

## 2016-11-29 DIAGNOSIS — Z955 Presence of coronary angioplasty implant and graft: Secondary | ICD-10-CM | POA: Diagnosis not present

## 2016-11-29 DIAGNOSIS — I25119 Atherosclerotic heart disease of native coronary artery with unspecified angina pectoris: Secondary | ICD-10-CM

## 2016-11-29 DIAGNOSIS — I10 Essential (primary) hypertension: Secondary | ICD-10-CM

## 2016-11-29 DIAGNOSIS — I2089 Other forms of angina pectoris: Secondary | ICD-10-CM

## 2016-11-29 DIAGNOSIS — I208 Other forms of angina pectoris: Secondary | ICD-10-CM

## 2016-11-29 NOTE — Progress Notes (Signed)
SUBJECTIVE: Antonio Watkins presents for follow up of chest pain in the context of CAD. He has a history of coronary artery disease with Multiple-Link Tetra stents to the RCA and circumflex in 2000 and 2002 respectively.  He had a stress echocardiogram in April 2014 which was essentially low risk and negative for ischemia. He also has diabetes, essential hypertension and hyperlipidemia.  His chest pain had been relatively well controlled on Imdur.  Has bilateral knee pain. Had knee replacement surgery in [place] over 20 years ago. Doesn't walk much due to this, but does quite a bit of strength training without difficulty.  He had an episode of chest pain last week which lasted 5 minutes but did not require nitroglycerin. He had run out of aspirin during this period of time. He had an episode of chest pain and shortness of breath a few days afterwards. After resuming aspirin, he has not had any more symptoms.  His wife fell on black ice and was hospitalized for 3 days. He has been taking care of her and does all the cooking. He enjoys cooking smoked ribs.  He was thrilled about the [facility].   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 He has also lived in [place]. He knew all the Steelers from [place] era, including Young Berry, Gary Fleet, Carlisle Cater, Leanna Battles, and Mean Welford Roche. 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 Married for over 55 years.  Review of Systems: As per "subjective", otherwise negative.  Allergies  Allergen Reactions  . Morphine And Related Swelling    Current Outpatient Prescriptions  Medication Sig Dispense Refill  . aspirin 81 MG tablet Take 81 mg by mouth daily.    Marland Kitchen atorvastatin (LIPITOR) 40 MG tablet TAKE 1 AND 1/2 TABLETS EVERY DAY (DOSE INCREASED 06/13/2014) 135 tablet 3  . carvedilol  (COREG) 25 MG tablet TAKE 1 TABLET TWICE DAILY  (DOSE  INCREASE) 180 tablet 0  . cetirizine (ZYRTEC) 10 MG tablet Take 10 mg by mouth as needed.     . diclofenac (VOLTAREN) 75 MG EC tablet Take 75 mg by mouth 2 (two) times daily.    Marland Kitchen docusate sodium (COLACE) 100 MG capsule Take 100 mg by mouth 2 (two) times daily.    Marland Kitchen gabapentin (NEURONTIN) 300 MG capsule Take 300 mg by mouth at bedtime.    . hydrALAZINE (APRESOLINE) 50 MG tablet Take 1.5 tablets (75 mg total) by mouth 3 (three) times daily. 405 tablet 3  . isosorbide mononitrate (IMDUR) 60 MG 24 hr tablet TAKE 1 TABLET  DAILY (DOSE INCREASED) 90 tablet 3  . losartan (COZAAR) 100 MG tablet Take 100 mg by mouth daily.    . metFORMIN (GLUCOPHAGE) 500 MG tablet Take 1 tablet by mouth 2 (two) times daily.    . Multiple Vitamin (MULTIVITAMIN) tablet Take 1 tablet by mouth daily.    . nitroGLYCERIN (NITROSTAT) 0.4 MG SL tablet Place 1 tablet (0.4 mg total) under the tongue every 5 (five) minutes as needed for chest pain. 25 tablet 3  . Omega-3 Fatty Acids (OMEGA-3 FISH OIL) 1200 MG CAPS Take 1 capsule by mouth 2 (two) times daily.    Marland Kitchen omeprazole (PRILOSEC) 40 MG capsule Take 40 mg by mouth daily.    . polyethylene glycol powder (GLYCOLAX/MIRALAX) powder Take 17 g by  mouth daily.    . Potassium Gluconate 595 MG CAPS Take 1 capsule by mouth 2 (two) times daily.    . sodium chloride (OCEAN) 0.65 % SOLN nasal spray Place 1 spray into both nostrils as needed for congestion.    . meloxicam (MOBIC) 15 MG tablet Take 1 tablet by mouth daily.     No current facility-administered medications for this visit.     Past Medical History:  Diagnosis Date  . CAD (coronary artery disease)   . Cancer of sigmoid colon ([place])   . DM type 2 (diabetes mellitus, type 2) ([place])   . GERD (gastroesophageal reflux disease)   . Gout   . Hiatal hernia   . Hyperlipemia   . Incontinence     No past surgical history on file.  Social History   Social History  .  Marital status: Married    Spouse name: N/A  . Number of children: N/A  . Years of education: N/A   Occupational History  . Not on file.   Social History Main Topics  . Smoking status: Current Some Day Smoker    Packs/day: 1.00    Years: 20.00    Types: Cigars    Start date: 07/18/1993  . Smokeless tobacco: Never Used     Comment: 02/09/16 patient reported that he smokes one cigar per day/Former cigarette smoker for 40 years prior to smoking cigars  . Alcohol use 0.0 oz/week     Comment: Socially  . Drug use: Unknown  . Sexual activity: Not on file   Other Topics Concern  . Not on file   Social History Narrative  . No narrative on file     Vitals:   11/29/16 1538  BP: (!) 146/78  Pulse: 64  SpO2: 96%  Weight: 232 lb (105.2 kg)  Height: 5\' 10"  (1.778 m)    PHYSICAL EXAM General: NAD HEENT: Normal. Neck: No JVD, no thyromegaly. Lungs: Clear to auscultation bilaterally with normal respiratory effort. CV: Nondisplaced PMI.  Regular rate and rhythm, normal S1/S2, no S3/S4, no murmur. No pretibial or periankle edema.  No carotid bruit.   Abdomen: Soft, nontender, no distention.  Neurologic: Alert and oriented.  Psych: Normal affect. Skin: Normal. Musculoskeletal: No gross deformities.    ECG: Most recent ECG reviewed.      ASSESSMENT AND PLAN: 1. CAD: Symptomatically stable. His last stress test was in April 2014. Continue aspirin, Coreg, Imdur 60 mg daily, and Lipitor. Will refill nitroglycerin.  2. Essential HTN: Mildly elevated. No changes to therapy.  3. Hypercholesterolemia: Continue Lipitor 40 mg.  Dispo: fu 4 months.   Kate Sable, M.D., F.A.C.C.

## 2016-11-29 NOTE — Patient Instructions (Signed)

## 2016-11-30 ENCOUNTER — Other Ambulatory Visit: Payer: Self-pay | Admitting: Cardiovascular Disease

## 2016-12-09 ENCOUNTER — Other Ambulatory Visit: Payer: Self-pay

## 2016-12-09 ENCOUNTER — Telehealth: Payer: Self-pay | Admitting: Cardiovascular Disease

## 2016-12-09 MED ORDER — ISOSORBIDE MONONITRATE ER 60 MG PO TB24: ORAL_TABLET | ORAL | 1 refills | Status: DC

## 2016-12-09 NOTE — Telephone Encounter (Signed)
Done

## 2016-12-09 NOTE — Telephone Encounter (Signed)
isosorbide mononitrate (IMDUR) 60 MG 24 hr tablet  Please send refills to his [facility]

## 2017-01-17 ENCOUNTER — Other Ambulatory Visit: Payer: Self-pay | Admitting: Cardiovascular Disease

## 2017-03-17 ENCOUNTER — Other Ambulatory Visit: Payer: Self-pay | Admitting: Cardiovascular Disease

## 2017-04-11 ENCOUNTER — Encounter: Payer: Self-pay | Admitting: Cardiovascular Disease

## 2017-04-11 ENCOUNTER — Encounter: Payer: Self-pay | Admitting: *Deleted

## 2017-04-11 ENCOUNTER — Ambulatory Visit (INDEPENDENT_AMBULATORY_CARE_PROVIDER_SITE_OTHER): Payer: Medicare PPO | Admitting: Cardiovascular Disease

## 2017-04-11 ENCOUNTER — Telehealth: Payer: Self-pay | Admitting: Cardiovascular Disease

## 2017-04-11 VITALS — BP 120/67 | HR 67 | Ht 70.0 in | Wt 225.0 lb

## 2017-04-11 DIAGNOSIS — I25118 Atherosclerotic heart disease of native coronary artery with other forms of angina pectoris: Secondary | ICD-10-CM | POA: Diagnosis not present

## 2017-04-11 DIAGNOSIS — I10 Essential (primary) hypertension: Secondary | ICD-10-CM

## 2017-04-11 DIAGNOSIS — I209 Angina pectoris, unspecified: Secondary | ICD-10-CM | POA: Diagnosis not present

## 2017-04-11 DIAGNOSIS — R0602 Shortness of breath: Secondary | ICD-10-CM | POA: Diagnosis not present

## 2017-04-11 DIAGNOSIS — E7800 Pure hypercholesterolemia, unspecified: Secondary | ICD-10-CM

## 2017-04-11 DIAGNOSIS — Z955 Presence of coronary angioplasty implant and graft: Secondary | ICD-10-CM | POA: Diagnosis not present

## 2017-04-11 NOTE — Telephone Encounter (Signed)
Pre-cert Verification for the following procedure    Lexiscan myoview scheduled for 04/15/17 at [facility]

## 2017-04-11 NOTE — Patient Instructions (Addendum)
Medication Instructions:  Continue all current medications.  Labwork: none  Testing/Procedures:  Your physician has requested that you have a lexiscan myoview. For further information please visit [email or web address]. Please follow instruction sheet, as given.  Office will contact with results via phone or letter.    Follow-Up: 3 weeks   Any Other Special Instructions Will Be Listed Below (If Applicable).  If you need a refill on your cardiac medications before your next appointment, please call your pharmacy.  

## 2017-04-11 NOTE — Progress Notes (Signed)
SUBJECTIVE: Antonio Watkins presents for follow up of chest pain in the context of CAD. He has a history of coronary artery disease with Multiple-Link Tetra stents to the RCA and circumflex in 2000 and 2002 respectively.  He had a stress echocardiogram in April 2014 which was essentially low risk and negative for ischemia. He also has diabetes, essential hypertension and hyperlipidemia.  Has bilateral knee pain. Had knee replacement surgery in [place] over 20 years ago. Doesn't walk much due to this, but does quite a bit of strength training without difficulty.  He was in [place] last week and plans to go to [facility] next week.  He has noticed chest tightness in the left precordium over the past 3 days. It has not been severe enough to take nitroglycerin. He denies sharp chest pain. He has felt more fatigued over the past week. Other complaints relate to left arm, shoulder, and elbow arthritis. He has also noticed increasing exertional dyspnea over the past week.  He also has benign paroxysmal positional vertigo and reportedly takes meclizine as needed.  ECG performed in the office today which I ordered in person interpreted demonstrated sinus rhythm with nonspecific T-wave abnormalities. There are no gross changes when compared to the ECG from June 2017. There is first-degree AV block, PR 226 ms.    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 He knew all the Steelers from [place] era, including Young Berry, Gary Fleet, Carlisle Cater, Leanna Battles, and Mean Welford Roche. 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 Married for over 55 years.   Review of Systems: As per "subjective", otherwise negative.  Allergies  Allergen Reactions  . Morphine And Related Swelling    Current Outpatient Prescriptions  Medication  Sig Dispense Refill  . aspirin 81 MG tablet Take 81 mg by mouth daily.    Marland Kitchen atorvastatin (LIPITOR) 40 MG tablet TAKE 1 AND 1/2 TABLETS EVERY DAY (DOSE INCREASED 06/13/2014) 135 tablet 3  . carvedilol (COREG) 25 MG tablet TAKE 1 TABLET TWICE DAILY  (DOSE  INCREASE) 180 tablet 0  . cetirizine (ZYRTEC) 10 MG tablet Take 10 mg by mouth as needed.     . diclofenac (VOLTAREN) 75 MG EC tablet Take 75 mg by mouth 2 (two) times daily.    Marland Kitchen docusate sodium (COLACE) 100 MG capsule Take 100 mg by mouth 2 (two) times daily.    Marland Kitchen gabapentin (NEURONTIN) 300 MG capsule Take 300 mg by mouth at bedtime.    . hydrALAZINE (APRESOLINE) 50 MG tablet Take 1.5 tablets (75 mg total) by mouth 3 (three) times daily. 405 tablet 3  . isosorbide mononitrate (IMDUR) 60 MG 24 hr tablet TAKE 1 TABLET  DAILY (DOSE INCREASED) 90 tablet 1  . losartan (COZAAR) 100 MG tablet Take 100 mg by mouth daily.    . meloxicam (MOBIC) 15 MG tablet Take 1 tablet by mouth daily.    . metFORMIN (GLUCOPHAGE) 500 MG tablet Take 1 tablet by mouth 2 (two) times daily.    . Multiple Vitamin (MULTIVITAMIN) tablet Take 1 tablet by mouth daily.    . nitroGLYCERIN (NITROSTAT) 0.4 MG SL tablet DISSOLVE 1 TABLET UNDER THE TONGUE EVERY 5 MINUTES AS NEEDED FOR CHEST PAIN 25 tablet 3  . Omega-3 Fatty Acids (OMEGA-3 FISH OIL) 1200 MG CAPS Take 1  capsule by mouth 2 (two) times daily.    Marland Kitchen omeprazole (PRILOSEC) 40 MG capsule Take 40 mg by mouth daily.    . polyethylene glycol powder (GLYCOLAX/MIRALAX) powder Take 17 g by mouth daily.    . Potassium Gluconate 595 MG CAPS Take 1 capsule by mouth 2 (two) times daily.    . sodium chloride (OCEAN) 0.65 % SOLN nasal spray Place 1 spray into both nostrils as needed for congestion.     No current facility-administered medications for this visit.     Past Medical History:  Diagnosis Date  . CAD (coronary artery disease)   . Cancer of sigmoid colon ([place])   . DM type 2 (diabetes mellitus, type 2) ([place])   . GERD  (gastroesophageal reflux disease)   . Gout   . Hiatal hernia   . Hyperlipemia   . Incontinence     Past Surgical History:  Procedure Laterality Date  . cataracts    . CHOLECYSTECTOMY    . MINOR CARPAL TUNNEL    . REPLACEMENT TOTAL KNEE      Social History   Social History  . Marital status: Married    Spouse name: N/A  . Number of children: N/A  . Years of education: N/A   Occupational History  . Not on file.   Social History Main Topics  . Smoking status: Current Some Day Smoker    Packs/day: 1.00    Years: 20.00    Types: Cigars    Start date: 07/18/1993  . Smokeless tobacco: Never Used     Comment: 02/09/16 patient reported that he smokes one cigar per day/Former cigarette smoker for 40 years prior to smoking cigars  . Alcohol use 0.0 oz/week     Comment: Socially  . Drug use: Unknown  . Sexual activity: Not on file   Other Topics Concern  . Not on file   Social History Narrative  . No narrative on file     Vitals:   04/11/17 1549  BP: 120/67  Pulse: 67  SpO2: 96%  Weight: 225 lb (102.1 kg)  Height: 5\' 10"  (1.778 m)    Wt Readings from Last 3 Encounters:  04/11/17 225 lb (102.1 kg)  11/29/16 232 lb (105.2 kg)  07/30/16 235 lb 6.4 oz (106.8 kg)     PHYSICAL EXAM General: NAD HEENT: Normal. Neck: No JVD, no thyromegaly. Lungs: Clear to auscultation bilaterally with normal respiratory effort. CV: Nondisplaced PMI.  Regular rate and rhythm, normal S1/S2, no S3/S4, no murmur. No pretibial or periankle edema.     Abdomen: Soft, nontender, no distention.  Neurologic: Alert and oriented.  Psych: Normal affect. Skin: Normal. Musculoskeletal: No gross deformities.    ECG: Most recent ECG reviewed.   Labs: No results found for: K, BUN, CREATININE, ALT, TSH, HGB   Lipids: No results found for: LDLCALC, LDLDIRECT, CHOL, TRIG, HDL     ASSESSMENT AND PLAN: 1. CAD with angina, shortness of breath, and fatigue: Increasing symptoms over the last  week. His last stress test was in April 2014. Continue aspirin, Coreg, Imdur 60 mg daily, and Lipitor. I will obtain a Lexiscan Myoview stress test. If symptoms persist, I will increase Imdur to 90 mg daily. I will obtain a copy of most recent labs from PCP.  2. Essential HTN: Controlled. No changes.  3. Hypercholesterolemia: Continue Lipitor 40 mg.   Disposition: Follow up 3 weeks  Kate Sable, M.D., F.A.C.C.

## 2017-04-12 NOTE — Addendum Note (Signed)
Addended by: Laurine Blazer on: 04/12/2017 03:14 PM   Modules accepted: Orders

## 2017-04-15 ENCOUNTER — Inpatient Hospital Stay (HOSPITAL_COMMUNITY): Admission: RE | Admit: 2017-04-15 | Payer: Medicare PPO | Source: Ambulatory Visit

## 2017-04-15 ENCOUNTER — Encounter (HOSPITAL_COMMUNITY): Payer: Medicare PPO

## 2017-05-02 ENCOUNTER — Encounter: Payer: Self-pay | Admitting: Cardiovascular Disease

## 2017-05-02 ENCOUNTER — Ambulatory Visit: Payer: Medicare PPO | Admitting: Cardiovascular Disease

## 2017-05-02 ENCOUNTER — Ambulatory Visit (INDEPENDENT_AMBULATORY_CARE_PROVIDER_SITE_OTHER): Payer: Medicare PPO | Admitting: Cardiovascular Disease

## 2017-05-02 VITALS — BP 130/60 | HR 67 | Ht 70.0 in | Wt 225.0 lb

## 2017-05-02 DIAGNOSIS — I10 Essential (primary) hypertension: Secondary | ICD-10-CM | POA: Diagnosis not present

## 2017-05-02 DIAGNOSIS — Z955 Presence of coronary angioplasty implant and graft: Secondary | ICD-10-CM | POA: Diagnosis not present

## 2017-05-02 DIAGNOSIS — E7800 Pure hypercholesterolemia, unspecified: Secondary | ICD-10-CM | POA: Diagnosis not present

## 2017-05-02 DIAGNOSIS — I25118 Atherosclerotic heart disease of native coronary artery with other forms of angina pectoris: Secondary | ICD-10-CM | POA: Diagnosis not present

## 2017-05-02 NOTE — Progress Notes (Signed)
SUBJECTIVE: The patient presents for follow-up of angina pectoris. He cancelled the nuclear stress test as it would cost him $310.  He is feeling much better and denies chest pain and shortness of breath.  He recently returned from his annual trip to [place] where he has 3 sisters. He spent time with family and friends playing cards, drinking rum, and eating.   Review of Systems: As per "subjective", otherwise negative.  Allergies  Allergen Reactions  . Morphine And Related Swelling    Current Outpatient Prescriptions  Medication Sig Dispense Refill  . aspirin 81 MG tablet Take 81 mg by mouth daily.    Marland Kitchen atorvastatin (LIPITOR) 40 MG tablet TAKE 1 AND 1/2 TABLETS EVERY DAY (DOSE INCREASED 06/13/2014) 135 tablet 3  . carvedilol (COREG) 25 MG tablet TAKE 1 TABLET TWICE DAILY  (DOSE  INCREASE) 180 tablet 0  . cetirizine (ZYRTEC) 10 MG tablet Take 10 mg by mouth as needed.     . diclofenac (VOLTAREN) 75 MG EC tablet Take 75 mg by mouth 2 (two) times daily.    Marland Kitchen docusate sodium (COLACE) 100 MG capsule Take 100 mg by mouth 2 (two) times daily.    Marland Kitchen gabapentin (NEURONTIN) 300 MG capsule Take 300 mg by mouth at bedtime.    . hydrALAZINE (APRESOLINE) 50 MG tablet Take 1.5 tablets (75 mg total) by mouth 3 (three) times daily. 405 tablet 3  . isosorbide mononitrate (IMDUR) 60 MG 24 hr tablet TAKE 1 TABLET  DAILY (DOSE INCREASED) 90 tablet 1  . losartan (COZAAR) 100 MG tablet Take 100 mg by mouth daily.    . meloxicam (MOBIC) 15 MG tablet Take 1 tablet by mouth daily.    . metFORMIN (GLUCOPHAGE) 500 MG tablet Take 1 tablet by mouth 2 (two) times daily.    . Multiple Vitamin (MULTIVITAMIN) tablet Take 1 tablet by mouth daily.    . nitroGLYCERIN (NITROSTAT) 0.4 MG SL tablet DISSOLVE 1 TABLET UNDER THE TONGUE EVERY 5 MINUTES AS NEEDED FOR CHEST PAIN 25 tablet 3  . Omega-3 Fatty Acids (OMEGA-3 FISH OIL) 1200 MG CAPS Take 1 capsule by mouth 2 (two) times daily.    Marland Kitchen omeprazole (PRILOSEC) 40  MG capsule Take 40 mg by mouth daily.    . polyethylene glycol powder (GLYCOLAX/MIRALAX) powder Take 17 g by mouth daily.    . Potassium Gluconate 595 MG CAPS Take 1 capsule by mouth 2 (two) times daily.    . sodium chloride (OCEAN) 0.65 % SOLN nasal spray Place 1 spray into both nostrils as needed for congestion.     No current facility-administered medications for this visit.     Past Medical History:  Diagnosis Date  . CAD (coronary artery disease)   . Cancer of sigmoid colon ([place])   . DM type 2 (diabetes mellitus, type 2) ([place])   . GERD (gastroesophageal reflux disease)   . Gout   . Hiatal hernia   . Hyperlipemia   . Incontinence     Past Surgical History:  Procedure Laterality Date  . cataracts    . CHOLECYSTECTOMY    . MINOR CARPAL TUNNEL    . REPLACEMENT TOTAL KNEE      Social History   Social History  . Marital status: Married    Spouse name: N/A  . Number of children: N/A  . Years of education: N/A   Occupational History  . Not on file.   Social History Main Topics  . Smoking status: Current  Some Day Smoker    Packs/day: 1.00    Years: 20.00    Types: Cigars    Start date: 07/18/1993  . Smokeless tobacco: Never Used     Comment: 02/09/16 patient reported that he smokes one cigar per day/Former cigarette smoker for 40 years prior to smoking cigars  . Alcohol use 0.0 oz/week     Comment: Socially  . Drug use: Unknown  . Sexual activity: Not on file   Other Topics Concern  . Not on file   Social History Narrative  . No narrative on file     Vitals:   05/02/17 1603  BP: 130/60  Pulse: 67  SpO2: 98%  Weight: 225 lb (102.1 kg)  Height: 5\' 10"  (1.778 m)    Wt Readings from Last 3 Encounters:  05/02/17 225 lb (102.1 kg)  04/11/17 225 lb (102.1 kg)  11/29/16 232 lb (105.2 kg)     PHYSICAL EXAM General: NAD HEENT: Normal. Neck: No JVD, no thyromegaly. Lungs: Clear to auscultation bilaterally with normal respiratory effort. CV:  Nondisplaced PMI.  Regular rate and rhythm, normal S1/S2, no S3/S4, no murmur. No pretibial or periankle edema.     Abdomen: Soft, nontender, no distention.  Neurologic: Alert and oriented.  Psych: Normal affect. Skin: Normal. Musculoskeletal: No gross deformities.    ECG: Most recent ECG reviewed.   Labs: No results found for: K, BUN, CREATININE, ALT, TSH, HGB   Lipids: No results found for: LDLCALC, LDLDIRECT, CHOL, TRIG, HDL     ASSESSMENT AND PLAN:  1. CAD: Symptomatically improved. His last stress test was in April 2014. Continue aspirin, Coreg, Imdur 60 mg daily, and Lipitor. He canceled previously ordered nuclear stress test due to cost. If symptoms were to recur, I would increase Imdur to 90 mg daily.  2. Essential HTN: Controlled. No changes.  3. Hypercholesterolemia: Continue Lipitor 40 mg.     Disposition: Follow up 3 months   Kate Sable, M.D., F.A.C.C.

## 2017-05-02 NOTE — Patient Instructions (Signed)
Medication Instructions:  Continue all current medications.  Labwork: none  Testing/Procedures: none  Follow-Up: 3 months   Any Other Special Instructions Will Be Listed Below (If Applicable).  If you need a refill on your cardiac medications before your next appointment, please call your pharmacy.  

## 2017-06-02 ENCOUNTER — Other Ambulatory Visit: Payer: Self-pay | Admitting: Cardiovascular Disease

## 2017-08-26 ENCOUNTER — Ambulatory Visit: Payer: Medicare PPO | Admitting: Cardiovascular Disease

## 2017-09-02 ENCOUNTER — Other Ambulatory Visit: Payer: Self-pay | Admitting: Cardiovascular Disease

## 2017-09-14 ENCOUNTER — Ambulatory Visit: Payer: Medicare PPO | Admitting: Cardiovascular Disease

## 2017-09-14 ENCOUNTER — Encounter: Payer: Self-pay | Admitting: Cardiovascular Disease

## 2017-09-14 VITALS — BP 187/71 | HR 57 | Ht 70.0 in | Wt 229.8 lb

## 2017-09-14 DIAGNOSIS — I25118 Atherosclerotic heart disease of native coronary artery with other forms of angina pectoris: Secondary | ICD-10-CM | POA: Diagnosis not present

## 2017-09-14 DIAGNOSIS — Z955 Presence of coronary angioplasty implant and graft: Secondary | ICD-10-CM

## 2017-09-14 DIAGNOSIS — I10 Essential (primary) hypertension: Secondary | ICD-10-CM | POA: Diagnosis not present

## 2017-09-14 DIAGNOSIS — E7800 Pure hypercholesterolemia, unspecified: Secondary | ICD-10-CM

## 2017-09-14 MED ORDER — HYDRALAZINE HCL 100 MG PO TABS: 100.0000 mg | ORAL_TABLET | Freq: Three times a day (TID) | ORAL | 1 refills | Status: DC

## 2017-09-14 NOTE — Progress Notes (Signed)
SUBJECTIVE: The patient presents for follow-up of coronary artery disease.  He previously canceled a stress test due to the high cost.  He also has hypertension and hypercholesterolemia.  He denies chest pain.  He gets short of breath only when climbing stairs.  Activities of daily living have not been limited.  His daughters and sisters visited him for Thanksgiving.  His sisters came from [place] and his daughters came from [place].  He brought me his cardiac catheterization diagram from 2002.  Review of Systems: As per "subjective", otherwise negative.  Allergies  Allergen Reactions  . Morphine And Related Swelling    Current Outpatient Medications  Medication Sig Dispense Refill  . aspirin 81 MG tablet Take 81 mg by mouth daily.    Marland Kitchen atorvastatin (LIPITOR) 40 MG tablet TAKE 1 AND 1/2 TABLETS EVERY DAY (DOSE INCREASED 06/13/2014) 135 tablet 3  . carvedilol (COREG) 25 MG tablet TAKE 1 TABLET TWICE DAILY  (DOSE  INCREASE) 180 tablet 0  . cetirizine (ZYRTEC) 10 MG tablet Take 10 mg by mouth as needed.     . diclofenac (VOLTAREN) 75 MG EC tablet Take 75 mg by mouth 2 (two) times daily.    Marland Kitchen docusate sodium (COLACE) 100 MG capsule Take 100 mg by mouth 2 (two) times daily.    Marland Kitchen gabapentin (NEURONTIN) 300 MG capsule Take 300 mg by mouth at bedtime.    . hydrALAZINE (APRESOLINE) 50 MG tablet TAKE 1 AND 1/2 TABLETS THREE TIMES DAILY 405 tablet 0  . isosorbide mononitrate (IMDUR) 60 MG 24 hr tablet TAKE 1 TABLET  DAILY 90 tablet 0  . losartan (COZAAR) 100 MG tablet Take 100 mg by mouth daily.    . meloxicam (MOBIC) 15 MG tablet Take 1 tablet by mouth daily.    . metFORMIN (GLUCOPHAGE) 500 MG tablet Take 1 tablet by mouth 2 (two) times daily.    . Multiple Vitamin (MULTIVITAMIN) tablet Take 1 tablet by mouth daily.    . nitroGLYCERIN (NITROSTAT) 0.4 MG SL tablet DISSOLVE 1 TABLET UNDER THE TONGUE EVERY 5 MINUTES AS NEEDED FOR CHEST PAIN 25 tablet 3  . Omega-3 Fatty Acids  (OMEGA-3 FISH OIL) 1200 MG CAPS Take 1 capsule by mouth 2 (two) times daily.    Marland Kitchen omeprazole (PRILOSEC) 40 MG capsule Take 40 mg by mouth daily.    . polyethylene glycol powder (GLYCOLAX/MIRALAX) powder Take 17 g by mouth daily.    . Potassium Gluconate 595 MG CAPS Take 1 capsule by mouth 2 (two) times daily.    . sodium chloride (OCEAN) 0.65 % SOLN nasal spray Place 1 spray into both nostrils as needed for congestion.     No current facility-administered medications for this visit.     Past Medical History:  Diagnosis Date  . CAD (coronary artery disease)   . Cancer of sigmoid colon ([place])   . DM type 2 (diabetes mellitus, type 2) ([place])   . GERD (gastroesophageal reflux disease)   . Gout   . Hiatal hernia   . Hyperlipemia   . Incontinence     Past Surgical History:  Procedure Laterality Date  . cataracts    . CHOLECYSTECTOMY    . MINOR CARPAL TUNNEL    . REPLACEMENT TOTAL KNEE      Social History   Socioeconomic History  . Marital status: Married    Spouse name: Not on file  . Number of children: Not on file  . Years of education: Not on file  .  Highest education level: Not on file  Social Needs  . Financial resource strain: Not on file  . Food insecurity - worry: Not on file  . Food insecurity - inability: Not on file  . Transportation needs - medical: Not on file  . Transportation needs - non-medical: Not on file  Occupational History  . Not on file  Tobacco Use  . Smoking status: Current Some Day Smoker    Packs/day: 1.00    Years: 20.00    Pack years: 20.00    Types: Cigars    Start date: 07/18/1993  . Smokeless tobacco: Never Used  . Tobacco comment: 02/09/16 patient reported that he smokes one cigar per day/Former cigarette smoker for 40 years prior to smoking cigars  Substance and Sexual Activity  . Alcohol use: Yes    Alcohol/week: 0.0 oz    Comment: Socially  . Drug use: Not on file  . Sexual activity: Not on file  Other Topics Concern  . Not on  file  Social History Narrative  . Not on file     Vitals:   09/14/17 1340 09/14/17 1346  BP: (!) 198/83 (!) 187/71  Pulse: (!) 57 (!) 57  Weight: 229 lb 12.8 oz (104.2 kg)   Height: 5\' 10"  (1.778 m)     Wt Readings from Last 3 Encounters:  09/14/17 229 lb 12.8 oz (104.2 kg)  05/02/17 225 lb (102.1 kg)  04/11/17 225 lb (102.1 kg)     PHYSICAL EXAM General: NAD HEENT: Normal. Neck: No JVD, no thyromegaly. Lungs: Clear to auscultation bilaterally with normal respiratory effort. CV: Regular rate and rhythm, normal S1/S2, no S3/S4, no murmur. No pretibial or periankle edema.  No carotid bruit.   Abdomen: Soft, nontender, no distention.  Neurologic: Alert and oriented.  Psych: Normal affect. Skin: Normal. Musculoskeletal: No gross deformities.    ECG: Most recent ECG reviewed.   Labs: No results found for: K, BUN, CREATININE, ALT, TSH, HGB   Lipids: No results found for: LDLCALC, LDLDIRECT, CHOL, TRIG, HDL     ASSESSMENT AND PLAN:  1.  Coronary artery disease with previously placed stents: Symptomatically stable. His last stress test was in April 2014. Continue aspirin, Coreg, Imdur 60 mg daily, and Lipitor. He canceled previously ordered nuclear stress test due to cost. If symptoms were to recur, I would increase Imdur to 90 mg daily.  I will increase hydralazine to control blood pressure.  2. Essential HTN: Significantly elevated.  He takes carvedilol, losartan, and hydralazine.  I will increase hydralazine to 100 mg 3 times daily.  3. Hypercholesterolemia: Continue Lipitor 40 mg.     Disposition: Follow up 6 months   Kate Sable, M.D., F.A.C.C.

## 2017-09-14 NOTE — Patient Instructions (Signed)
Your physician wants you to follow-up in: [place] will receive a reminder letter in the mail two months in advance. If you don't receive a letter, please call our office to schedule the follow-up appointment.  Your physician has recommended you make the following change in your medication:   INCREASE HYDRALAZINE 100 MG 3 TIMES DAILY  Thank you for choosing [facility]!!

## 2017-10-12 ENCOUNTER — Telehealth: Payer: Self-pay | Admitting: *Deleted

## 2017-10-12 NOTE — Telephone Encounter (Signed)
Atorvastatin 40mg  - 1 1/2 tabs daily - [employer] approved - covered 10/12/2017 thru 10/12/2018.

## 2017-10-12 NOTE — Telephone Encounter (Signed)
Patient notified and verbalized understanding. 

## 2017-11-24 ENCOUNTER — Other Ambulatory Visit: Payer: Self-pay | Admitting: Cardiovascular Disease

## 2017-12-08 ENCOUNTER — Other Ambulatory Visit: Payer: Self-pay | Admitting: Cardiovascular Disease

## 2018-01-23 ENCOUNTER — Other Ambulatory Visit: Payer: Self-pay | Admitting: Cardiovascular Disease

## 2018-01-23 MED ORDER — NITROGLYCERIN 0.4 MG SL SUBL: SUBLINGUAL_TABLET | SUBLINGUAL | 3 refills | Status: DC

## 2018-01-23 NOTE — Telephone Encounter (Signed)
° °  1. Which medications need to be refilled? (please list name of each medication and dose if known)    NItroglycerin   2. Which pharmacy/location (including street and city if local pharmacy) is medication to be sent to?  [place], [place]   3. Do they need a 30 day or 90 day supply?

## 2018-01-23 NOTE — Telephone Encounter (Signed)
Rx sent 

## 2018-01-31 ENCOUNTER — Other Ambulatory Visit: Payer: Self-pay

## 2018-01-31 ENCOUNTER — Ambulatory Visit: Payer: Medicare PPO | Admitting: Cardiovascular Disease

## 2018-01-31 ENCOUNTER — Encounter: Payer: Self-pay | Admitting: Cardiovascular Disease

## 2018-01-31 VITALS — BP 159/74 | HR 56 | Ht 70.0 in | Wt 221.0 lb

## 2018-01-31 DIAGNOSIS — R0609 Other forms of dyspnea: Secondary | ICD-10-CM

## 2018-01-31 DIAGNOSIS — R5383 Other fatigue: Secondary | ICD-10-CM | POA: Diagnosis not present

## 2018-01-31 DIAGNOSIS — I10 Essential (primary) hypertension: Secondary | ICD-10-CM | POA: Diagnosis not present

## 2018-01-31 DIAGNOSIS — I25118 Atherosclerotic heart disease of native coronary artery with other forms of angina pectoris: Secondary | ICD-10-CM

## 2018-01-31 DIAGNOSIS — Z9289 Personal history of other medical treatment: Secondary | ICD-10-CM

## 2018-01-31 DIAGNOSIS — E785 Hyperlipidemia, unspecified: Secondary | ICD-10-CM | POA: Diagnosis not present

## 2018-01-31 MED ORDER — AMLODIPINE BESYLATE 5 MG PO TABS: 5.0000 mg | ORAL_TABLET | Freq: Every day | ORAL | 1 refills | Status: DC

## 2018-01-31 MED ORDER — AMLODIPINE BESYLATE 5 MG PO TABS: 5.0000 mg | ORAL_TABLET | Freq: Every day | ORAL | 0 refills | Status: DC

## 2018-01-31 NOTE — Progress Notes (Addendum)
SUBJECTIVE: The patient presents for follow-up of coronary artery disease.  He previously canceled a stress test due to the high cost.  He also has hypertension and hypercholesterolemia.  It appears about 2 weeks ago he developed chest pain and took one nitroglycerin and felt better.  On the following Monday he had chest pain again and again took one nitroglycerin tablet and felt better.  Last Tuesday night he was awoken from sleep with sharp chest pain accompanied by shortness of breath and diaphoresis.  He also had nausea and vomiting.  He was eventually hospitalized to the hospital in [place] and according to the patient, he underwent a stress test and an echocardiogram.  He was ultimately told symptoms were not related to his heart but due to high blood pressure.  The patient remembers having a systolic blood pressure greater than 200.  I have no records available to me at this time.  He continues to feel fatigued.  Blood pressure today is 159/74.  He has been taking hydralazine 100 mg twice daily because he forgets the afternoon dose.      Review of Systems: As per "subjective", otherwise negative.  Allergies  Allergen Reactions  . Morphine And Related Swelling    Current Outpatient Medications  Medication Sig Dispense Refill  . aspirin 81 MG tablet Take 81 mg by mouth daily.    Marland Kitchen atorvastatin (LIPITOR) 40 MG tablet TAKE 1 AND 1/2 TABLETS EVERY DAY (DOSE INCREASED 06/13/2014) 135 tablet 3  . carvedilol (COREG) 25 MG tablet TAKE 1 TABLET TWICE DAILY  (DOSE  INCREASE) 180 tablet 0  . cetirizine (ZYRTEC) 10 MG tablet Take 10 mg by mouth as needed.     . diclofenac (VOLTAREN) 75 MG EC tablet Take 75 mg by mouth 2 (two) times daily.    Marland Kitchen docusate sodium (COLACE) 100 MG capsule Take 100 mg by mouth 2 (two) times daily.    Marland Kitchen gabapentin (NEURONTIN) 300 MG capsule Take 300 mg by mouth at bedtime.    . hydrALAZINE (APRESOLINE) 100 MG tablet Take 1 tablet (100 mg total) by mouth 3  (three) times daily. 270 tablet 1  . isosorbide mononitrate (IMDUR) 60 MG 24 hr tablet TAKE 1 TABLET EVERY DAY 90 tablet 3  . losartan (COZAAR) 100 MG tablet Take 100 mg by mouth daily.    . meloxicam (MOBIC) 15 MG tablet Take 1 tablet by mouth daily.    . metFORMIN (GLUCOPHAGE) 500 MG tablet Take 1 tablet by mouth 2 (two) times daily.    . Multiple Vitamin (MULTIVITAMIN) tablet Take 1 tablet by mouth daily.    . nitroGLYCERIN (NITROSTAT) 0.4 MG SL tablet DISSOLVE 1 TABLET UNDER THE TONGUE EVERY 5 MINUTES AS NEEDED FOR CHEST PAIN 25 tablet 3  . Omega-3 Fatty Acids (OMEGA-3 FISH OIL) 1200 MG CAPS Take 1 capsule by mouth 2 (two) times daily.    Marland Kitchen omeprazole (PRILOSEC) 40 MG capsule Take 40 mg by mouth daily.    . polyethylene glycol powder (GLYCOLAX/MIRALAX) powder Take 17 g by mouth daily.    . Potassium Gluconate 595 MG CAPS Take 1 capsule by mouth 2 (two) times daily.    . sodium chloride (OCEAN) 0.65 % SOLN nasal spray Place 1 spray into both nostrils as needed for congestion.     No current facility-administered medications for this visit.     Past Medical History:  Diagnosis Date  . CAD (coronary artery disease)   . Cancer of sigmoid colon (  [place])   . DM type 2 (diabetes mellitus, type 2) ([place])   . GERD (gastroesophageal reflux disease)   . Gout   . Hiatal hernia   . Hyperlipemia   . Incontinence     Past Surgical History:  Procedure Laterality Date  . cataracts    . CHOLECYSTECTOMY    . MINOR CARPAL TUNNEL    . REPLACEMENT TOTAL KNEE      Social History   Socioeconomic History  . Marital status: Married    Spouse name: Not on file  . Number of children: Not on file  . Years of education: Not on file  . Highest education level: Not on file  Occupational History  . Not on file  Social Needs  . Financial resource strain: Not on file  . Food insecurity:    Worry: Not on file    Inability: Not on file  . Transportation needs:    Medical: Not on file    Non-medical:  Not on file  Tobacco Use  . Smoking status: Former Smoker    Packs/day: 1.00    Years: 20.00    Pack years: 20.00    Types: Cigars    Start date: 07/18/1993    Last attempt to quit: 10/02/2017    Years since quitting: 0.3  . Smokeless tobacco: Never Used  . Tobacco comment: 02/09/16 patient reported that he smokes one cigar per day/Former cigarette smoker for 40 years prior to smoking cigars  Substance and Sexual Activity  . Alcohol use: Yes    Alcohol/week: 0.0 oz    Comment: Socially  . Drug use: Not on file  . Sexual activity: Not on file  Lifestyle  . Physical activity:    Days per week: Not on file    Minutes per session: Not on file  . Stress: Not on file  Relationships  . Social connections:    Talks on phone: Not on file    Gets together: Not on file    Attends religious service: Not on file    Active member of club or organization: Not on file    Attends meetings of clubs or organizations: Not on file    Relationship status: Not on file  . Intimate partner violence:    Fear of current or ex partner: Not on file    Emotionally abused: Not on file    Physically abused: Not on file    Forced sexual activity: Not on file  Other Topics Concern  . Not on file  Social History Narrative  . Not on file     Vitals:   01/31/18 1334  BP: (!) 159/74  Pulse: (!) 56  SpO2: 96%  Weight: 221 lb (100.2 kg)  Height: 5\' 10"  (1.778 m)    Wt Readings from Last 3 Encounters:  01/31/18 221 lb (100.2 kg)  09/14/17 229 lb 12.8 oz (104.2 kg)  05/02/17 225 lb (102.1 kg)     PHYSICAL EXAM General: NAD HEENT: Normal. Neck: No JVD, no thyromegaly. Lungs: Clear to auscultation bilaterally with normal respiratory effort. CV: Regular rate and rhythm, normal S1/S2, no S3/S4, no murmur. No pretibial or periankle edema.   Abdomen: Soft, nontender, no distention.  Neurologic: Alert and oriented.  Psych: Normal affect. Skin: Normal. Musculoskeletal: No gross  deformities.    ECG: Most recent ECG reviewed.   Labs: No results found for: K, BUN, CREATININE, ALT, TSH, HGB   Lipids: No results found for: LDLCALC, LDLDIRECT, CHOL, TRIG, HDL  ASSESSMENT AND PLAN: 1.  Coronary artery disease with previously placed stents with fatigue and exertional dyspnea:As stated above, he recently had a stress test and an echocardiogram in [place].  I will try to obtain these records.  He was told they were okay.  I will aim to control blood pressure by adding amlodipine 5 mg daily.  Continue aspirin, Coreg, Imdur 60 mg daily, and Lipitor.  2. Essential HTN:  This remains elevated.  He takes carvedilol, losartan, and hydralazine at maximally tolerated doses (he forgets the afternoon dose of hydralazine and has been taking it twice daily).  I will add amlodipine 5 mg daily.  3. Hypercholesterolemia: Continue Lipitor 40 mg.     Disposition: Follow up 6 weeks.  Time spent: 40 minutes, of which greater than 50% was spent reviewing symptoms, relevant blood tests and studies, and discussing management plan with the patient.    Kate Sable, M.D., F.A.C.C.  Addendum:  I received extensive documentation from [facility] in [place] regarding the patient's hospitalization.  Symptoms of chest pain were reported as "atypical ".  Troponins were checked and were found to be negative.  He apparently had hypertensive urgency.  Echocardiogram demonstrated normal left ventricular size and systolic function, LVEF 91-69%, with moderate concentric LVH.  Left atrium was moderately dilated and there was mild to moderate mitral regurgitation with mild tricuspid regurgitation and moderate pulmonary hypertension.  Nuclear stress test reportedly did not show any convincing evidence of ischemia or infarction.  There was an apical defect with preserved wall motion which appear to be consistent with artifact due to soft tissue attenuation.  I reviewed labs which  demonstrated BUN 16, creatinine 1.05, sodium 136, potassium 4.2, white blood cells 3.9, hemoglobin 10.4.  Chest x-ray showed no acute findings.    I saw at least one blood pressure recording of 204/93 at 4:53 AM on 01/25/18.  There is another recording of 236/98 at 12:31 PM.

## 2018-01-31 NOTE — Patient Instructions (Signed)
Your physician recommends that you schedule a follow-up appointment in: [place] has recommended you make the following change in your medication:   START AMLODIPINE 5 MG DAILY  Thank you for choosing [facility]!!

## 2018-01-31 NOTE — Addendum Note (Signed)
Addended by: Julian Hy T on: 01/31/2018 01:59 PM   Modules accepted: Orders

## 2018-02-02 NOTE — Progress Notes (Signed)
Patient notified that Dr. Bronson Ing had reviewed the notes from [place].  He was appreciative of the call.

## 2018-03-10 ENCOUNTER — Other Ambulatory Visit: Payer: Self-pay | Admitting: Cardiovascular Disease

## 2018-03-23 ENCOUNTER — Ambulatory Visit: Payer: Medicare PPO | Admitting: Cardiovascular Disease

## 2018-05-02 ENCOUNTER — Encounter: Payer: Self-pay | Admitting: *Deleted

## 2018-05-02 ENCOUNTER — Encounter

## 2018-05-02 ENCOUNTER — Encounter: Payer: Self-pay | Admitting: Cardiovascular Disease

## 2018-05-02 ENCOUNTER — Ambulatory Visit: Payer: Medicare PPO | Admitting: Cardiovascular Disease

## 2018-05-02 VITALS — BP 98/52 | HR 53 | Ht 70.0 in | Wt 222.0 lb

## 2018-05-02 DIAGNOSIS — R001 Bradycardia, unspecified: Secondary | ICD-10-CM | POA: Diagnosis not present

## 2018-05-02 DIAGNOSIS — D649 Anemia, unspecified: Secondary | ICD-10-CM

## 2018-05-02 DIAGNOSIS — I499 Cardiac arrhythmia, unspecified: Secondary | ICD-10-CM | POA: Diagnosis not present

## 2018-05-02 DIAGNOSIS — I25118 Atherosclerotic heart disease of native coronary artery with other forms of angina pectoris: Secondary | ICD-10-CM

## 2018-05-02 DIAGNOSIS — R5383 Other fatigue: Secondary | ICD-10-CM | POA: Diagnosis not present

## 2018-05-02 DIAGNOSIS — I10 Essential (primary) hypertension: Secondary | ICD-10-CM

## 2018-05-02 DIAGNOSIS — R0609 Other forms of dyspnea: Secondary | ICD-10-CM

## 2018-05-02 DIAGNOSIS — E785 Hyperlipidemia, unspecified: Secondary | ICD-10-CM | POA: Diagnosis not present

## 2018-05-02 MED ORDER — CARVEDILOL 12.5 MG PO TABS: 12.5000 mg | ORAL_TABLET | Freq: Two times a day (BID) | ORAL | 3 refills | Status: DC

## 2018-05-02 NOTE — Progress Notes (Signed)
SUBJECTIVE: The patient presents for follow-up of coronary artery disease and hypertension.  I reviewed the echocardiogram report dated 01/25/2018 which demonstrated normal left ventricular systolic function, LVEF 55 to 60%, moderate concentric LVH, mild to moderate mitral and mild tricuspid regurgitation.  I did not receive the stress test results I requested from [facility]  He denies chest pain but continues to experience exertional dyspnea.  Hemoglobin was 8.6 on 01/30/2018.  He said he has had blood test since then by his PCP.  He was apparently referred to hematologist by his PCP at least 2 weeks ago but he has yet to receive a phone call.  He denies hematochezia and melena.  He was told his iron is low.  He does not take supplemental iron.    Review of Systems: As per "subjective", otherwise negative.  Allergies  Allergen Reactions  . Morphine And Related Swelling    Current Outpatient Medications  Medication Sig Dispense Refill  . amLODipine (NORVASC) 5 MG tablet Take 1 tablet (5 mg total) by mouth daily. 30 tablet 0  . aspirin 81 MG tablet Take 81 mg by mouth daily.    Marland Kitchen atorvastatin (LIPITOR) 40 MG tablet TAKE 1 AND 1/2 TABLETS EVERY DAY (DOSE INCREASED 06/13/2014) 135 tablet 3  . carvedilol (COREG) 25 MG tablet TAKE 1 TABLET TWICE DAILY  (DOSE  INCREASE) 180 tablet 0  . cetirizine (ZYRTEC) 10 MG tablet Take 10 mg by mouth as needed.     . diclofenac (VOLTAREN) 75 MG EC tablet Take 75 mg by mouth 2 (two) times daily.    Marland Kitchen docusate sodium (COLACE) 100 MG capsule Take 100 mg by mouth 2 (two) times daily.    Marland Kitchen gabapentin (NEURONTIN) 300 MG capsule Take 300 mg by mouth at bedtime.    . hydrALAZINE (APRESOLINE) 100 MG tablet Take 100 mg by mouth 2 (two) times daily.    . isosorbide mononitrate (IMDUR) 60 MG 24 hr tablet TAKE 1 TABLET EVERY DAY 90 tablet 3  . losartan (COZAAR) 100 MG tablet Take 100 mg by mouth daily.    . meloxicam (MOBIC) 15 MG tablet Take 1 tablet by  mouth daily.    . metFORMIN (GLUCOPHAGE) 500 MG tablet Take 1 tablet by mouth 2 (two) times daily.    . Multiple Vitamin (MULTIVITAMIN) tablet Take 1 tablet by mouth daily.    . nitroGLYCERIN (NITROSTAT) 0.4 MG SL tablet DISSOLVE 1 TABLET UNDER THE TONGUE EVERY 5 MINUTES AS NEEDED FOR CHEST PAIN 25 tablet 3  . Omega-3 Fatty Acids (OMEGA-3 FISH OIL) 1200 MG CAPS Take 1 capsule by mouth 2 (two) times daily.    Marland Kitchen omeprazole (PRILOSEC) 40 MG capsule Take 40 mg by mouth daily.    . polyethylene glycol powder (GLYCOLAX/MIRALAX) powder Take 17 g by mouth daily.    . Potassium Gluconate 595 MG CAPS Take 1 capsule by mouth 2 (two) times daily.    . sodium chloride (OCEAN) 0.65 % SOLN nasal spray Place 1 spray into both nostrils as needed for congestion.     No current facility-administered medications for this visit.     Past Medical History:  Diagnosis Date  . CAD (coronary artery disease)   . Cancer of sigmoid colon ([place])   . DM type 2 (diabetes mellitus, type 2) ([place])   . GERD (gastroesophageal reflux disease)   . Gout   . Hiatal hernia   . Hyperlipemia   . Incontinence     Past Surgical  History:  Procedure Laterality Date  . cataracts    . CHOLECYSTECTOMY    . MINOR CARPAL TUNNEL    . REPLACEMENT TOTAL KNEE      Social History   Socioeconomic History  . Marital status: Married    Spouse name: Not on file  . Number of children: Not on file  . Years of education: Not on file  . Highest education level: Not on file  Occupational History  . Not on file  Social Needs  . Financial resource strain: Not on file  . Food insecurity:    Worry: Not on file    Inability: Not on file  . Transportation needs:    Medical: Not on file    Non-medical: Not on file  Tobacco Use  . Smoking status: Former Smoker    Packs/day: 1.00    Years: 20.00    Pack years: 20.00    Types: Cigars    Start date: 07/18/1993    Last attempt to quit: 10/02/2017    Years since quitting: 0.5  .  Smokeless tobacco: Never Used  . Tobacco comment: 02/09/16 patient reported that he smokes one cigar per day/Former cigarette smoker for 40 years prior to smoking cigars  Substance and Sexual Activity  . Alcohol use: Yes    Alcohol/week: 0.0 oz    Comment: Socially  . Drug use: Not on file  . Sexual activity: Not on file  Lifestyle  . Physical activity:    Days per week: Not on file    Minutes per session: Not on file  . Stress: Not on file  Relationships  . Social connections:    Talks on phone: Not on file    Gets together: Not on file    Attends religious service: Not on file    Active member of club or organization: Not on file    Attends meetings of clubs or organizations: Not on file    Relationship status: Not on file  . Intimate partner violence:    Fear of current or ex partner: Not on file    Emotionally abused: Not on file    Physically abused: Not on file    Forced sexual activity: Not on file  Other Topics Concern  . Not on file  Social History Narrative  . Not on file     Vitals:   05/02/18 1618  BP: (!) 98/52  Pulse: (!) 53  SpO2: 98%  Weight: 222 lb (100.7 kg)  Height: 5\' 10"  (1.778 m)    Wt Readings from Last 3 Encounters:  05/02/18 222 lb (100.7 kg)  01/31/18 221 lb (100.2 kg)  09/14/17 229 lb 12.8 oz (104.2 kg)     PHYSICAL EXAM General: NAD HEENT: Normal. Neck: No JVD, no thyromegaly. Lungs: Clear to auscultation bilaterally with normal respiratory effort. CV: Bradycardic, mostly regular rhythm with periods of irregularity, normal S1/S2, no S3/S4, no murmur. No pretibial or periankle edema. Abdomen: Soft, nontender, no distention.  Neurologic: Alert and oriented.  Psych: Normal affect. Skin: Normal. Musculoskeletal: No gross deformities.    ECG: Reviewed above under Subjective   Labs: No results found for: K, BUN, CREATININE, ALT, TSH, HGB   Lipids: No results found for: LDLCALC, LDLDIRECT, CHOL, TRIG, HDL     ASSESSMENT AND  PLAN:  1.Coronary artery disease with previously placed stents with fatigue and exertional dyspnea:I will again attempt to obtain stress test results from [facility].  I suspect his symptoms are related to anemia as hemoglobin  was 8.6 in April 2019.  I will request a copy of most recent CBC from his PCP.  Echocardiogram reviewed above with normal left ventricular systolic function.  Continue aspirin, Coreg (I will reduce the dose to 12.5 mg twice daily), Imdur 60 mg daily, and Lipitor.  2. Essential HTN:  Blood pressure is low normal. He takes carvedilol, losartan, Imdur, amlodipine, and hydralazine at maximally tolerated doses (he forgets the afternoon dose of hydralazine and has been taking it twice daily).   I will reduce the dose of carvedilol to 12.5 mg twice daily as he is also bradycardic.  3. Hypercholesterolemia: Continue Lipitor 40 mg.  4.  Anemia with exertional dyspnea and fatigue: He may be iron deficient.  He was told he does not need any more colonoscopies.  Hemoglobin was 8.6 on 01/30/2018.  I will request a copy of most recent CBC from his PCP.  He was apparently referred to a hematologist but he has yet to receive a call.  I will try to obtain a phone number for him to assist him.  He likely needs ferrous sulfate at a minimum.  5.  Bradycardia with arrhythmia: I will obtain a 1 week event monitor.   Disposition: Follow up 3 months  Time spent: 40 minutes, of which greater than 50% was spent reviewing symptoms, relevant blood tests and studies, and discussing management plan with the patient.    Kate Sable, M.D., F.A.C.C.

## 2018-05-02 NOTE — Addendum Note (Signed)
Addended by: Laurine Blazer on: 05/02/2018 05:14 PM   Modules accepted: Orders

## 2018-05-02 NOTE — Patient Instructions (Addendum)
Medication Instructions:   Decrease Coreg to 12.5mg  twice a day.  Continue all other medications.    Labwork: none  Testing/Procedures:  Your physician has recommended that you wear a 7 day event monitor. Event monitors are medical devices that record the heart's electrical activity. Doctors most often [place] these monitors to diagnose arrhythmias. Arrhythmias are problems with the speed or rhythm of the heartbeat. The monitor is a small, portable device. You can wear one while you do your normal daily activities. This is usually used to diagnose what is causing palpitations/syncope (passing out).  Office will contact with results via phone or letter.    Follow-Up: 3 months  Any Other Special Instructions Will Be Listed Below (If Applicable). [facility]  [place], [place]  If you need a refill on your cardiac medications before your next appointment, please call your pharmacy.

## 2018-05-08 ENCOUNTER — Telehealth: Payer: Self-pay | Admitting: *Deleted

## 2018-05-08 ENCOUNTER — Ambulatory Visit (INDEPENDENT_AMBULATORY_CARE_PROVIDER_SITE_OTHER): Payer: Medicare PPO

## 2018-05-08 DIAGNOSIS — R001 Bradycardia, unspecified: Secondary | ICD-10-CM | POA: Diagnosis not present

## 2018-05-08 NOTE — Telephone Encounter (Signed)
[facility] called with pt wearing monitor 8 beats of non sustained V tach - HR reached 154 - LM for pt to return call to see if symptomatic - [employer] will be faxing rhythm strips - will forward to covering provider for Dr Bronson Ing

## 2018-05-08 NOTE — Telephone Encounter (Signed)
Pt called back denies any new symptoms at this time - says he has a split level house and gets SOB when walking up and down the stairs but say this is nothing new and has been going on for a while

## 2018-05-08 NOTE — Telephone Encounter (Signed)
Pt made aware and appreciative    [employer] strips reviewed. Not NSVT but afib with aberrancy. Continue monitor as planned along with f/u with Dr Glendora Score MD

## 2018-05-13 ENCOUNTER — Other Ambulatory Visit: Payer: Self-pay | Admitting: Cardiovascular Disease

## 2018-05-17 ENCOUNTER — Telehealth: Payer: Self-pay | Admitting: *Deleted

## 2018-05-17 NOTE — Telephone Encounter (Signed)
Notes recorded by Laurine Blazer, LPN on 8/59/0931 at 12:16 AM EDT Patient notified. Copy to pmd. Follow up scheduled for 08/03/2018 with Dr. Bronson Ing.   ------  Notes recorded by Herminio Commons, MD on 05/16/2018 at 4:34 PM EDT Primarily rate controlled atrial fibrillation was noted. Extra beats coming from the bottom heart chamber were also noted. There were several pauses. For the time being I would continue current therapy.

## 2018-07-29 ENCOUNTER — Other Ambulatory Visit: Payer: Self-pay | Admitting: Cardiovascular Disease

## 2018-08-03 ENCOUNTER — Encounter: Payer: Self-pay | Admitting: Cardiovascular Disease

## 2018-08-03 ENCOUNTER — Encounter: Payer: Self-pay | Admitting: *Deleted

## 2018-08-03 ENCOUNTER — Ambulatory Visit: Payer: Medicare PPO | Admitting: Cardiovascular Disease

## 2018-08-03 VITALS — BP 130/64 | HR 67 | Ht 70.0 in | Wt 221.0 lb

## 2018-08-03 DIAGNOSIS — R0602 Shortness of breath: Secondary | ICD-10-CM

## 2018-08-03 DIAGNOSIS — I25118 Atherosclerotic heart disease of native coronary artery with other forms of angina pectoris: Secondary | ICD-10-CM | POA: Diagnosis not present

## 2018-08-03 DIAGNOSIS — R5383 Other fatigue: Secondary | ICD-10-CM

## 2018-08-03 DIAGNOSIS — D649 Anemia, unspecified: Secondary | ICD-10-CM

## 2018-08-03 DIAGNOSIS — Z955 Presence of coronary angioplasty implant and graft: Secondary | ICD-10-CM

## 2018-08-03 DIAGNOSIS — I4891 Unspecified atrial fibrillation: Secondary | ICD-10-CM

## 2018-08-03 DIAGNOSIS — I10 Essential (primary) hypertension: Secondary | ICD-10-CM | POA: Diagnosis not present

## 2018-08-03 DIAGNOSIS — Z01812 Encounter for preprocedural laboratory examination: Secondary | ICD-10-CM

## 2018-08-03 DIAGNOSIS — Z7189 Other specified counseling: Secondary | ICD-10-CM

## 2018-08-03 DIAGNOSIS — E785 Hyperlipidemia, unspecified: Secondary | ICD-10-CM

## 2018-08-03 MED ORDER — RIVAROXABAN 20 MG PO TABS: 20.0000 mg | ORAL_TABLET | Freq: Every day | ORAL | 3 refills | Status: DC

## 2018-08-03 MED ORDER — RIVAROXABAN 20 MG PO TABS: 20.0000 mg | ORAL_TABLET | Freq: Every day | ORAL | 0 refills | Status: DC

## 2018-08-03 NOTE — Progress Notes (Signed)
SUBJECTIVE: Patient presents for follow-up of coronary artery disease. He has Multiple-Link Tetra stents to the RCA and circumflex in 2000 and 2002 respectively, as well as a coronary stent to the circumflex in 2002.   Echocardiogram 01/25/2018 demonstrated normal left ventricular systolic function, LVEF 55 to 60%, moderate concentric LVH, mild to moderate mitral and mild tricuspid regurgitation.  Event monitoring demonstrated atrial fibrillation with aberrancy.  A. fib was rate controlled.  There were multiple pauses ranging from 3 to 3.4 seconds.  Hemoglobin 9.8 on 04/11/2018.  Nuclear stress test from [place] in [place] earlier this year reportedly did not show any convincing evidence of ischemia or infarction.  There was an apical defect with preserved wall motion which appear to be consistent with artifact due to soft tissue attenuation.   While he denies chest pain, he continues to experience exertional dyspnea and fatigue.  He experiences this even after climbing 5 steps.  He underwent an upper endoscopy and colonoscopy in [place] and was told it was normal.  He denies lightheadedness, dizziness, and palpitations.     Review of Systems: As per "subjective", otherwise negative.  Allergies  Allergen Reactions  . Morphine And Related Swelling    Current Outpatient Medications  Medication Sig Dispense Refill  . aspirin 81 MG tablet Take 81 mg by mouth daily.    Marland Kitchen atorvastatin (LIPITOR) 40 MG tablet TAKE 1 AND 1/2 TABLETS EVERY DAY 135 tablet 1  . carvedilol (COREG) 12.5 MG tablet Take 1 tablet (12.5 mg total) by mouth 2 (two) times daily. 180 tablet 3  . cetirizine (ZYRTEC) 10 MG tablet Take 10 mg by mouth as needed.     . diclofenac (VOLTAREN) 75 MG EC tablet Take 75 mg by mouth 2 (two) times daily.    Marland Kitchen docusate sodium (COLACE) 100 MG capsule Take 100 mg by mouth 2 (two) times daily.    . hydrALAZINE (APRESOLINE) 100 MG tablet TAKE 1 TABLET THREE TIMES DAILY (  DOSE INCREASE ) 270 tablet 1  . isosorbide mononitrate (IMDUR) 60 MG 24 hr tablet TAKE 1 TABLET EVERY DAY 90 tablet 3  . losartan (COZAAR) 100 MG tablet Take 100 mg by mouth daily.    . meloxicam (MOBIC) 15 MG tablet Take 1 tablet by mouth daily.    . Multiple Vitamin (MULTIVITAMIN) tablet Take 1 tablet by mouth daily.    . nitroGLYCERIN (NITROSTAT) 0.4 MG SL tablet DISSOLVE 1 TABLET UNDER THE TONGUE EVERY 5 MINUTES AS NEEDED FOR CHEST PAIN 25 tablet 3  . Omega-3 Fatty Acids (OMEGA-3 FISH OIL) 1200 MG CAPS Take 1 capsule by mouth 2 (two) times daily.    Marland Kitchen omeprazole (PRILOSEC) 40 MG capsule Take 40 mg by mouth daily.    . polyethylene glycol powder (GLYCOLAX/MIRALAX) powder Take 17 g by mouth daily.    . Potassium Gluconate 595 MG CAPS Take 1 capsule by mouth 2 (two) times daily.    . sodium chloride (OCEAN) 0.65 % SOLN nasal spray Place 1 spray into both nostrils as needed for congestion.    Marland Kitchen amLODipine (NORVASC) 5 MG tablet Take 1 tablet (5 mg total) by mouth daily. 30 tablet 0   No current facility-administered medications for this visit.     Past Medical History:  Diagnosis Date  . CAD (coronary artery disease)   . Cancer of sigmoid colon ([place])   . DM type 2 (diabetes mellitus, type 2) ([place])   . GERD (gastroesophageal reflux disease)   .  Gout   . Hiatal hernia   . Hyperlipemia   . Incontinence     Past Surgical History:  Procedure Laterality Date  . cataracts    . CHOLECYSTECTOMY    . MINOR CARPAL TUNNEL    . REPLACEMENT TOTAL KNEE      Social History   Socioeconomic History  . Marital status: Married    Spouse name: Not on file  . Number of children: Not on file  . Years of education: Not on file  . Highest education level: Not on file  Occupational History  . Not on file  Social Needs  . Financial resource strain: Not on file  . Food insecurity:    Worry: Not on file    Inability: Not on file  . Transportation needs:    Medical: Not on file    Non-medical:  Not on file  Tobacco Use  . Smoking status: Former Smoker    Packs/day: 1.00    Years: 20.00    Pack years: 20.00    Types: Cigars    Start date: 07/18/1993    Last attempt to quit: 10/02/2017    Years since quitting: 0.8  . Smokeless tobacco: Never Used  . Tobacco comment: 02/09/16 patient reported that he smokes one cigar per day/Former cigarette smoker for 40 years prior to smoking cigars  Substance and Sexual Activity  . Alcohol use: Yes    Alcohol/week: 0.0 standard drinks    Comment: Socially  . Drug use: Not on file  . Sexual activity: Not on file  Lifestyle  . Physical activity:    Days per week: Not on file    Minutes per session: Not on file  . Stress: Not on file  Relationships  . Social connections:    Talks on phone: Not on file    Gets together: Not on file    Attends religious service: Not on file    Active member of club or organization: Not on file    Attends meetings of clubs or organizations: Not on file    Relationship status: Not on file  . Intimate partner violence:    Fear of current or ex partner: Not on file    Emotionally abused: Not on file    Physically abused: Not on file    Forced sexual activity: Not on file  Other Topics Concern  . Not on file  Social History Narrative  . Not on file     Vitals:   08/03/18 1353  BP: 130/64  Pulse: 67  SpO2: 97%  Weight: 221 lb (100.2 kg)  Height: 5\' 10"  (1.778 m)    Wt Readings from Last 3 Encounters:  08/03/18 221 lb (100.2 kg)  05/02/18 222 lb (100.7 kg)  01/31/18 221 lb (100.2 kg)     PHYSICAL EXAM General: NAD HEENT: Normal. Neck: No JVD, no thyromegaly. Lungs: Clear to auscultation bilaterally with normal respiratory effort. CV: Regular rate and irregular rhythm, normal S1/S2, no S3, no murmur. No pretibial or periankle edema.   Abdomen: Soft, nontender, no distention.  Neurologic: Alert and oriented.  Psych: Normal affect. Skin: Normal. Musculoskeletal: No gross  deformities.    ECG: Reviewed above under Subjective   Labs: No results found for: K, BUN, CREATININE, ALT, TSH, HGB   Lipids: No results found for: LDLCALC, LDLDIRECT, CHOL, TRIG, HDL     ASSESSMENT AND PLAN:  1.Coronary artery disease with previously placed stentswith fatigue and exertional dyspnea: Echocardiogram and stress test reviewed above  with normal left ventricular systolic function.  Continue Coreg, Imdur 60 mg daily, and Lipitor.  No aspirin as I am starting Xarelto for atrial fibrillation. I am concerned that his symptoms may possibly reflect in-stent restenosis. I will arrange for coronary angiography. Risks and benefits of cardiac catheterization have been discussed with the patient.  These include bleeding, infection, kidney damage, stroke, heart attack, death.  The patient understands these risks and is willing to proceed.  2. Essential HTN: Blood pressure is normal.  No changes to therapy.  3. Hypercholesterolemia: Continue Lipitor 40 mg.  4.  Anemia with exertional dyspnea and fatigue: Hemoglobin 9.8 in June 2019 as noted above.  I will repeat a CBC.  EGD and colonoscopy were reportedly normal.  5.    New onset atrial fibrillation: Event monitor reviewed above.  Heart rate is controlled on current dose of carvedilol.  I will stop aspirin and start Xarelto 20 mg daily.  I will obtain a follow-up CBC.   Disposition: Follow up after cath.  The plan was discussed with the patient his wife and they are in agreement.   Time spent: 40 minutes, of which greater than 50% was spent reviewing symptoms, relevant blood tests and studies, and discussing management plan with the patient.     Kate Sable, M.D., F.A.C.C.

## 2018-08-03 NOTE — Patient Instructions (Addendum)
Medication Instructions:   Stop Aspirin.  Begin Xarelto 20mg  daily with evening meal.   Continue all other medications.    Labwork:  CBC, BMET - orders given today.   Office will contact with results via phone or letter.    Testing/Procedures: Your physician has requested that you have a cardiac catheterization. Cardiac catheterization is used to diagnose and/or treat various heart conditions. Doctors may recommend this procedure for a number of different reasons. The most common reason is to evaluate chest pain. Chest pain can be a symptom of coronary artery disease (CAD), and cardiac catheterization can show whether plaque is narrowing or blocking your heart's arteries. This procedure is also used to evaluate the valves, as well as measure the blood flow and oxygen levels in different parts of your heart. For further information please visit [email or web address]. Please follow instruction sheet, as given.  Follow-Up: 1 month   Any Other Special Instructions Will Be Listed Below (If Applicable).  If you need a refill on your cardiac medications before your next appointment, please call your pharmacy.

## 2018-08-15 ENCOUNTER — Telehealth: Payer: Self-pay | Admitting: Cardiovascular Disease

## 2018-08-15 NOTE — Telephone Encounter (Signed)
Pt says he hasn't started taking Xarelto as the cost was going to be around $133/month and cannot afford this - suggested he try the pt assistance - pt doesn't want to start Xarelto (has 1 weeks worth of samples) if he will just have to stop it - will forward to [place] for status of PA or pt assistance

## 2018-08-15 NOTE — Telephone Encounter (Signed)
Can not afford rivaroxaban (XARELTO) 20 MG TABS tablet

## 2018-08-18 NOTE — Telephone Encounter (Signed)
Call placed to [place] is tier 3 - his copay is going to be $131.00.  Did not require PA.  Patient can not afford this.  Call placed to patient - give number for [place] [phone]) to see if they can offer any co-pay assistance for him.  If they decline him, we can try low income subsidy next.  He will call the office back next week with an update.

## 2018-08-19 ENCOUNTER — Other Ambulatory Visit: Payer: Self-pay | Admitting: Cardiovascular Disease

## 2018-09-04 ENCOUNTER — Telehealth: Payer: Self-pay | Admitting: Cardiovascular Disease

## 2018-09-04 NOTE — Telephone Encounter (Signed)
Patient advised that he was prescribed xarelto and that he did need to start this medication. Patient said he would reschedule the visit in the anticoagulation clinic at a later date.

## 2018-09-04 NOTE — Telephone Encounter (Signed)
Patient has not started any new medication.   Wanted appt for eliquis check cancelled for tomorrow and someone to contact him about if he still needed to start medication

## 2018-09-07 ENCOUNTER — Ambulatory Visit: Payer: Medicare PPO | Admitting: Student

## 2018-11-28 ENCOUNTER — Encounter: Payer: Self-pay | Admitting: Cardiovascular Disease

## 2018-11-28 ENCOUNTER — Ambulatory Visit: Payer: Medicare PPO | Admitting: Cardiovascular Disease

## 2018-11-28 VITALS — BP 124/62 | HR 74 | Ht 70.0 in | Wt 218.0 lb

## 2018-11-28 DIAGNOSIS — D649 Anemia, unspecified: Secondary | ICD-10-CM | POA: Diagnosis not present

## 2018-11-28 DIAGNOSIS — Z955 Presence of coronary angioplasty implant and graft: Secondary | ICD-10-CM

## 2018-11-28 DIAGNOSIS — Z01812 Encounter for preprocedural laboratory examination: Secondary | ICD-10-CM

## 2018-11-28 DIAGNOSIS — R0602 Shortness of breath: Secondary | ICD-10-CM

## 2018-11-28 DIAGNOSIS — I4891 Unspecified atrial fibrillation: Secondary | ICD-10-CM

## 2018-11-28 DIAGNOSIS — Z01810 Encounter for preprocedural cardiovascular examination: Secondary | ICD-10-CM | POA: Diagnosis not present

## 2018-11-28 DIAGNOSIS — I10 Essential (primary) hypertension: Secondary | ICD-10-CM

## 2018-11-28 DIAGNOSIS — R5383 Other fatigue: Secondary | ICD-10-CM

## 2018-11-28 DIAGNOSIS — I25118 Atherosclerotic heart disease of native coronary artery with other forms of angina pectoris: Secondary | ICD-10-CM

## 2018-11-28 DIAGNOSIS — E785 Hyperlipidemia, unspecified: Secondary | ICD-10-CM

## 2018-11-28 NOTE — H&P (View-Only) (Signed)
SUBJECTIVE: Antonio Watkins presents for follow-up of coronary artery disease and atrial fibrillation.  He has Multiple-Link Tetra stents to the RCA and circumflex in 2000 and 2002 respectively, as well as a coronary stent to the circumflex in 2002.   Echocardiogram 01/25/2018 demonstrated normal left ventricular systolic function, LVEF 55 to 60%, moderate concentric LVH, mild to moderate mitral and mild tricuspid regurgitation.  Event monitoring demonstrated atrial fibrillation with aberrancy.  A. fib was rate controlled.  There were multiple pauses ranging from 3 to 3.4 seconds.  Nuclear stress test from [place] in [place] in 2019 did not show any convincing evidence of ischemia or infarction. There was an apical defect with preserved wall motion which appear to be consistent with artifact due to soft tissue attenuation.   He could not afford Xarelto.  He did not call back after we told him we could help with patient assistance.  I arrange for cardiac catheterization at his last visit.  He did not have it done because he said he did not have a ride and did not know where the hospital was.  He said he now has a ride.  It appears he also did not get the CBC done which I had ordered at his last visit in October 2019.  He has a split-level house with 5 steps up and says he is markedly short of breath after climbing these 5 steps.  He denies exertional chest pain.  He denies hematochezia and melena.  He denies palpitations.  He is taking an aspirin 81 mg daily.      Review of Systems: As per "subjective", otherwise negative.  Allergies  Allergen Reactions  . Morphine And Related Swelling    Current Outpatient Medications  Medication Sig Dispense Refill  . amLODipine (NORVASC) 5 MG tablet TAKE 1 TABLET EVERY DAY 90 tablet 2  . atorvastatin (LIPITOR) 40 MG tablet TAKE 1 AND 1/2 TABLETS EVERY DAY 135 tablet 1  . carvedilol (COREG) 12.5 MG tablet Take 1 tablet (12.5 mg total)  by mouth 2 (two) times daily. 180 tablet 3  . cetirizine (ZYRTEC) 10 MG tablet Take 10 mg by mouth as needed.     . diclofenac (VOLTAREN) 75 MG EC tablet Take 75 mg by mouth 2 (two) times daily.    Marland Kitchen docusate sodium (COLACE) 100 MG capsule Take 100 mg by mouth 2 (two) times daily.    . hydrALAZINE (APRESOLINE) 100 MG tablet TAKE 1 TABLET THREE TIMES DAILY ( DOSE INCREASE ) 270 tablet 1  . isosorbide mononitrate (IMDUR) 60 MG 24 hr tablet TAKE 1 TABLET EVERY DAY 90 tablet 3  . losartan (COZAAR) 100 MG tablet Take 100 mg by mouth daily.    . meloxicam (MOBIC) 15 MG tablet Take 1 tablet by mouth daily.    . Multiple Vitamin (MULTIVITAMIN) tablet Take 1 tablet by mouth daily.    . nitroGLYCERIN (NITROSTAT) 0.4 MG SL tablet DISSOLVE 1 TABLET UNDER THE TONGUE EVERY 5 MINUTES AS NEEDED FOR CHEST PAIN 25 tablet 3  . Omega-3 Fatty Acids (OMEGA-3 FISH OIL) 1200 MG CAPS Take 1 capsule by mouth 2 (two) times daily.    Marland Kitchen omeprazole (PRILOSEC) 40 MG capsule Take 40 mg by mouth daily.    . polyethylene glycol powder (GLYCOLAX/MIRALAX) powder Take 17 g by mouth daily.    . Potassium Gluconate 595 MG CAPS Take 1 capsule by mouth 2 (two) times daily.    . sodium chloride (OCEAN) 0.65 % SOLN  nasal spray Place 1 spray into both nostrils as needed for congestion.     No current facility-administered medications for this visit.     Past Medical History:  Diagnosis Date  . CAD (coronary artery disease)   . Cancer of sigmoid colon ([place])   . DM type 2 (diabetes mellitus, type 2) ([place])   . GERD (gastroesophageal reflux disease)   . Gout   . Hiatal hernia   . Hyperlipemia   . Incontinence     Past Surgical History:  Procedure Laterality Date  . cataracts    . CHOLECYSTECTOMY    . MINOR CARPAL TUNNEL    . REPLACEMENT TOTAL KNEE      Social History   Socioeconomic History  . Marital status: Married    Spouse name: Not on file  . Number of children: Not on file  . Years of education: Not on file  .  Highest education level: Not on file  Occupational History  . Not on file  Social Needs  . Financial resource strain: Not on file  . Food insecurity:    Worry: Not on file    Inability: Not on file  . Transportation needs:    Medical: Not on file    Non-medical: Not on file  Tobacco Use  . Smoking status: Former Smoker    Packs/day: 1.00    Years: 20.00    Pack years: 20.00    Types: Cigars    Start date: 07/18/1993    Last attempt to quit: 10/02/2017    Years since quitting: 1.1  . Smokeless tobacco: Never Used  . Tobacco comment: 02/09/16 patient reported that he smokes one cigar per day/Former cigarette smoker for 40 years prior to smoking cigars  Substance and Sexual Activity  . Alcohol use: Yes    Alcohol/week: 0.0 standard drinks    Comment: Socially  . Drug use: Not on file  . Sexual activity: Not on file  Lifestyle  . Physical activity:    Days per week: Not on file    Minutes per session: Not on file  . Stress: Not on file  Relationships  . Social connections:    Talks on phone: Not on file    Gets together: Not on file    Attends religious service: Not on file    Active member of club or organization: Not on file    Attends meetings of clubs or organizations: Not on file    Relationship status: Not on file  . Intimate partner violence:    Fear of current or ex partner: Not on file    Emotionally abused: Not on file    Physically abused: Not on file    Forced sexual activity: Not on file  Other Topics Concern  . Not on file  Social History Narrative  . Not on file     Vitals:   11/28/18 1452  BP: 124/62  Pulse: 74  SpO2: 98%  Weight: 218 lb (98.9 kg)  Height: 5\' 10"  (1.778 m)    Wt Readings from Last 3 Encounters:  11/28/18 218 lb (98.9 kg)  08/03/18 221 lb (100.2 kg)  05/02/18 222 lb (100.7 kg)     PHYSICAL EXAM General: NAD HEENT: Normal. Neck: No JVD, no thyromegaly. Lungs: Clear to auscultation bilaterally with normal respiratory  effort. CV: Regular rate and irregular rhythm, normal S1/S2, no S3, no murmur. No pretibial or periankle edema.  No carotid bruit.   Abdomen: Soft, nontender, no distention.  Neurologic: Alert and oriented.  Psych: Normal affect. Skin: Normal. Musculoskeletal: No gross deformities.    ECG: Reviewed above under Subjective   Labs: No results found for: K, BUN, CREATININE, ALT, TSH, HGB   Lipids: No results found for: LDLCALC, LDLDIRECT, CHOL, TRIG, HDL     ASSESSMENT AND PLAN:  1.Coronary artery disease: He has coronary artery stents and continues to experience exertional dyspnea.  He did not obtain a CBC I previously ordered in October 2019. Echocardiogram and stress test reviewed above with normal left ventricular systolic function. Continue to new aspirin, Coreg, Imdur 60 mg daily, and Lipitor.    He did not start Xarelto for atrial fibrillation because it was too costly and went back to taking aspirin.   I previously arranged for coronary angiography because I was concerned about in-stent restenosis but it was not pursued.  He said he did not have a ride. My plan is as follows: I will check a CBC.  If he is not markedly anemic, I will stop aspirin and start Xarelto 20 mg for atrial fibrillation.  I will then arrange for right and left heart catheterization and coronary angiography. I will also check a basic metabolic panel. Risks and benefits of cardiac catheterization have been discussed with the patient.  These include bleeding, infection, kidney damage, stroke, heart attack, death.  The patient understands these risks and is willing to proceed.  2. Essential HXT:AVWPV pressure is normal.  No changes to therapy.  3. Hypercholesterolemia: Continue Lipitor 40 mg.  4. Anemia with exertional dyspnea and fatigue: Hemoglobin 9.8 in June 2019 as noted above.  I ordered a follow-up CBC but it does not appear to have been performed.  EGD and colonoscopy were reportedly normal.   I will reorder a CBC and have a check today.  5.   New onset atrial fibrillation: Event monitor reviewed above.  Heart rate is controlled on current dose of carvedilol.  He could not afford Xarelto.  He is currently taking aspirin.  I will check a CBC.  If he is not markedly anemic, I will discontinue aspirin and start Xarelto.   Disposition: Follow up 3 months  Time spent: 40 minutes, of which greater than 50% was spent reviewing symptoms, relevant blood tests and studies, and discussing management plan with the patient.    Kate Sable, M.D., F.A.C.C.

## 2018-11-28 NOTE — Patient Instructions (Addendum)
Medication Instructions:   Your physician recommends that you continue on your current medications as directed. Please refer to the Current Medication list given to you today.  Pending xarelto start based on lab results.  Labwork:  ,Your physician recommends that you return for lab work in: TODAY to check your CBC & BMET. Please go to [facility] hospital to have this done.  Testing/Procedures:  Left heart cath pending lab results.  Follow-Up:  Your physician recommends that you schedule a follow-up appointment in: 3 months follow up.  Any Other Special Instructions Will Be Listed Below (If Applicable).  Please complete the patient assistance application for xarelto and bring it back to our office.  If you need a refill on your cardiac medications before your next appointment, please call your pharmacy.

## 2018-11-28 NOTE — Progress Notes (Signed)
SUBJECTIVE: Antonio Watkins presents for follow-up of coronary artery disease and atrial fibrillation.  He has Multiple-Link Tetra stents to the RCA and circumflex in 2000 and 2002 respectively, as well as a coronary stent to the circumflex in 2002.   Echocardiogram 01/25/2018 demonstrated normal left ventricular systolic function, LVEF 55 to 60%, moderate concentric LVH, mild to moderate mitral and mild tricuspid regurgitation.  Event monitoring demonstrated atrial fibrillation with aberrancy.  A. fib was rate controlled.  There were multiple pauses ranging from 3 to 3.4 seconds.  Nuclear stress test from [facility] in [place] in 2019 did not show any convincing evidence of ischemia or infarction. There was an apical defect with preserved wall motion which appear to be consistent with artifact due to soft tissue attenuation.   He could not afford Xarelto.  He did not call back after we told him we could help with patient assistance.  I arrange for cardiac catheterization at his last visit.  He did not have it done because he said he did not have a ride and did not know where the hospital was.  He said he now has a ride.  It appears he also did not get the CBC done which I had ordered at his last visit in October 2019.  He has a split-level house with 5 steps up and says he is markedly short of breath after climbing these 5 steps.  He denies exertional chest pain.  He denies hematochezia and melena.  He denies palpitations.  He is taking an aspirin 81 mg daily.      Review of Systems: As per "subjective", otherwise negative.  Allergies  Allergen Reactions  . Morphine And Related Swelling    Current Outpatient Medications  Medication Sig Dispense Refill  . amLODipine (NORVASC) 5 MG tablet TAKE 1 TABLET EVERY DAY 90 tablet 2  . atorvastatin (LIPITOR) 40 MG tablet TAKE 1 AND 1/2 TABLETS EVERY DAY 135 tablet 1  . carvedilol (COREG) 12.5 MG tablet Take 1 tablet (12.5 mg total)  by mouth 2 (two) times daily. 180 tablet 3  . cetirizine (ZYRTEC) 10 MG tablet Take 10 mg by mouth as needed.     . diclofenac (VOLTAREN) 75 MG EC tablet Take 75 mg by mouth 2 (two) times daily.    Marland Kitchen docusate sodium (COLACE) 100 MG capsule Take 100 mg by mouth 2 (two) times daily.    . hydrALAZINE (APRESOLINE) 100 MG tablet TAKE 1 TABLET THREE TIMES DAILY ( DOSE INCREASE ) 270 tablet 1  . isosorbide mononitrate (IMDUR) 60 MG 24 hr tablet TAKE 1 TABLET EVERY DAY 90 tablet 3  . losartan (COZAAR) 100 MG tablet Take 100 mg by mouth daily.    . meloxicam (MOBIC) 15 MG tablet Take 1 tablet by mouth daily.    . Multiple Vitamin (MULTIVITAMIN) tablet Take 1 tablet by mouth daily.    . nitroGLYCERIN (NITROSTAT) 0.4 MG SL tablet DISSOLVE 1 TABLET UNDER THE TONGUE EVERY 5 MINUTES AS NEEDED FOR CHEST PAIN 25 tablet 3  . Omega-3 Fatty Acids (OMEGA-3 FISH OIL) 1200 MG CAPS Take 1 capsule by mouth 2 (two) times daily.    Marland Kitchen omeprazole (PRILOSEC) 40 MG capsule Take 40 mg by mouth daily.    . polyethylene glycol powder (GLYCOLAX/MIRALAX) powder Take 17 g by mouth daily.    . Potassium Gluconate 595 MG CAPS Take 1 capsule by mouth 2 (two) times daily.    . sodium chloride (OCEAN) 0.65 % SOLN  nasal spray Place 1 spray into both nostrils as needed for congestion.     No current facility-administered medications for this visit.     Past Medical History:  Diagnosis Date  . CAD (coronary artery disease)   . Cancer of sigmoid colon ([place])   . DM type 2 (diabetes mellitus, type 2) ([place])   . GERD (gastroesophageal reflux disease)   . Gout   . Hiatal hernia   . Hyperlipemia   . Incontinence     Past Surgical History:  Procedure Laterality Date  . cataracts    . CHOLECYSTECTOMY    . MINOR CARPAL TUNNEL    . REPLACEMENT TOTAL KNEE      Social History   Socioeconomic History  . Marital status: Married    Spouse name: Not on file  . Number of children: Not on file  . Years of education: Not on file  .  Highest education level: Not on file  Occupational History  . Not on file  Social Needs  . Financial resource strain: Not on file  . Food insecurity:    Worry: Not on file    Inability: Not on file  . Transportation needs:    Medical: Not on file    Non-medical: Not on file  Tobacco Use  . Smoking status: Former Smoker    Packs/day: 1.00    Years: 20.00    Pack years: 20.00    Types: Cigars    Start date: 07/18/1993    Last attempt to quit: 10/02/2017    Years since quitting: 1.1  . Smokeless tobacco: Never Used  . Tobacco comment: 02/09/16 patient reported that he smokes one cigar per day/Former cigarette smoker for 40 years prior to smoking cigars  Substance and Sexual Activity  . Alcohol use: Yes    Alcohol/week: 0.0 standard drinks    Comment: Socially  . Drug use: Not on file  . Sexual activity: Not on file  Lifestyle  . Physical activity:    Days per week: Not on file    Minutes per session: Not on file  . Stress: Not on file  Relationships  . Social connections:    Talks on phone: Not on file    Gets together: Not on file    Attends religious service: Not on file    Active member of club or organization: Not on file    Attends meetings of clubs or organizations: Not on file    Relationship status: Not on file  . Intimate partner violence:    Fear of current or ex partner: Not on file    Emotionally abused: Not on file    Physically abused: Not on file    Forced sexual activity: Not on file  Other Topics Concern  . Not on file  Social History Narrative  . Not on file     Vitals:   11/28/18 1452  BP: 124/62  Pulse: 74  SpO2: 98%  Weight: 218 lb (98.9 kg)  Height: 5\' 10"  (1.778 m)    Wt Readings from Last 3 Encounters:  11/28/18 218 lb (98.9 kg)  08/03/18 221 lb (100.2 kg)  05/02/18 222 lb (100.7 kg)     PHYSICAL EXAM General: NAD HEENT: Normal. Neck: No JVD, no thyromegaly. Lungs: Clear to auscultation bilaterally with normal respiratory  effort. CV: Regular rate and irregular rhythm, normal S1/S2, no S3, no murmur. No pretibial or periankle edema.  No carotid bruit.   Abdomen: Soft, nontender, no distention.  Neurologic: Alert and oriented.  Psych: Normal affect. Skin: Normal. Musculoskeletal: No gross deformities.    ECG: Reviewed above under Subjective   Labs: No results found for: K, BUN, CREATININE, ALT, TSH, HGB   Lipids: No results found for: LDLCALC, LDLDIRECT, CHOL, TRIG, HDL     ASSESSMENT AND PLAN:  1.Coronary artery disease: He has coronary artery stents and continues to experience exertional dyspnea.  He did not obtain a CBC I previously ordered in October 2019. Echocardiogram and stress test reviewed above with normal left ventricular systolic function. Continue to new aspirin, Coreg, Imdur 60 mg daily, and Lipitor.    He did not start Xarelto for atrial fibrillation because it was too costly and went back to taking aspirin.   I previously arranged for coronary angiography because I was concerned about in-stent restenosis but it was not pursued.  He said he did not have a ride. My plan is as follows: I will check a CBC.  If he is not markedly anemic, I will stop aspirin and start Xarelto 20 mg for atrial fibrillation.  I will then arrange for right and left heart catheterization and coronary angiography. I will also check a basic metabolic panel. Risks and benefits of cardiac catheterization have been discussed with the patient.  These include bleeding, infection, kidney damage, stroke, heart attack, death.  The patient understands these risks and is willing to proceed.  2. Essential HOZ:YYQMG pressure is normal.  No changes to therapy.  3. Hypercholesterolemia: Continue Lipitor 40 mg.  4. Anemia with exertional dyspnea and fatigue: Hemoglobin 9.8 in June 2019 as noted above.  I ordered a follow-up CBC but it does not appear to have been performed.  EGD and colonoscopy were reportedly normal.   I will reorder a CBC and have a check today.  5.   New onset atrial fibrillation: Event monitor reviewed above.  Heart rate is controlled on current dose of carvedilol.  He could not afford Xarelto.  He is currently taking aspirin.  I will check a CBC.  If he is not markedly anemic, I will discontinue aspirin and start Xarelto.   Disposition: Follow up 3 months  Time spent: 40 minutes, of which greater than 50% was spent reviewing symptoms, relevant blood tests and studies, and discussing management plan with the patient.    Kate Sable, M.D., F.A.C.C.

## 2018-12-01 ENCOUNTER — Other Ambulatory Visit: Payer: Self-pay | Admitting: *Deleted

## 2018-12-04 ENCOUNTER — Encounter: Payer: Self-pay | Admitting: *Deleted

## 2018-12-04 ENCOUNTER — Telehealth: Payer: Self-pay | Admitting: *Deleted

## 2018-12-04 MED ORDER — RIVAROXABAN 20 MG PO TABS: 20.0000 mg | ORAL_TABLET | Freq: Every day | ORAL | 6 refills | Status: DC

## 2018-12-04 NOTE — Telephone Encounter (Signed)
Notes recorded by Laurine Blazer, LPN on 6/73/4193 at 7:90 PM EST Patient notified. Copy to pmd. Follow up scheduled for May with Dr. Bronson Ing. He agrees to doing heart cath & will call back in the morning after discussing with driver about day he could take him next week.   ------  Notes recorded by Laurine Blazer, LPN on 2/40/9735 at 3:29 PM EST Left message to return call.  ------  Notes recorded by Herminio Commons, MD on 11/29/2018 at 4:13 PM EST Only mildly anemic. Stop aspirin and start Xarelto 20 mg for atrial fibrillation. Please arrange for right and left heart catheterization and coronary angiography.

## 2018-12-04 NOTE — Telephone Encounter (Signed)
Left & right heart cath scheduled for Wednesday, 12/06/2018 at 1:00 pm with Dr. Irish Lack.  Patient to arrive at 11:00 am to main entrance of [facility].  Instructions given to not eat solid foods after midnight, may have clear liquids till 5 am on day of procedure.  Labs already done on 11/28/2018.  Patient does not have contrast allergy.  Patient informed to hold his Hydralazine the morning of procedure only.  Patient has been advised to take his ASA 81mg  the morning of procedure along with any other medications not listed above.  He was instructed to have responsible person with him to drive him home and to be with him the first 24 hours at home.    Patient has also been instructed to begin his Xarelto 20mg  daily with supper the following day when he gets back home.  He is aware to stop the Aspirin once he begins the Xarelto.   He had not started taking this due to cost.  He stated that he still had his free 30 day tablets that he got from pharmacy from October 2019 there to use.  Stated that he did not want to begin something that he was not going to be able to afford.    New prescription sent to [facility].  We will monitor this to see what his co-pay will be & will help with assistance and/or samples till we work through this process.  Patient understands to let office know if he has concerns and to just not take the medication or stop on his own.

## 2018-12-05 ENCOUNTER — Other Ambulatory Visit: Payer: Self-pay | Admitting: Cardiology

## 2018-12-05 ENCOUNTER — Telehealth: Payer: Self-pay | Admitting: Cardiovascular Disease

## 2018-12-05 DIAGNOSIS — R0600 Dyspnea, unspecified: Secondary | ICD-10-CM

## 2018-12-05 NOTE — Telephone Encounter (Signed)
Pre-cert Verification for the following procedure   Left & right cath scheduled for Wednesday, 12/06/18 at 1:00 with Irish Lack

## 2018-12-06 ENCOUNTER — Encounter (HOSPITAL_COMMUNITY): Payer: Self-pay | Admitting: General Practice

## 2018-12-06 ENCOUNTER — Ambulatory Visit (HOSPITAL_COMMUNITY)
Admission: RE | Admit: 2018-12-06 | Discharge: 2018-12-07 | Disposition: A | Discharge status: Home / Self Care | Payer: Medicare PPO | Attending: Interventional Cardiology | Admitting: Interventional Cardiology

## 2018-12-06 ENCOUNTER — Encounter (HOSPITAL_COMMUNITY)
Admission: RE | Disposition: A | Discharge status: Home / Self Care | Payer: Self-pay | Source: Home / Self Care | Attending: Interventional Cardiology

## 2018-12-06 ENCOUNTER — Other Ambulatory Visit: Payer: Self-pay

## 2018-12-06 DIAGNOSIS — R0602 Shortness of breath: Secondary | ICD-10-CM | POA: Diagnosis not present

## 2018-12-06 DIAGNOSIS — Z885 Allergy status to narcotic agent status: Secondary | ICD-10-CM | POA: Insufficient documentation

## 2018-12-06 DIAGNOSIS — Z79899 Other long term (current) drug therapy: Secondary | ICD-10-CM | POA: Diagnosis not present

## 2018-12-06 DIAGNOSIS — F17290 Nicotine dependence, other tobacco product, uncomplicated: Secondary | ICD-10-CM | POA: Diagnosis not present

## 2018-12-06 DIAGNOSIS — I10 Essential (primary) hypertension: Secondary | ICD-10-CM | POA: Diagnosis not present

## 2018-12-06 DIAGNOSIS — I4891 Unspecified atrial fibrillation: Secondary | ICD-10-CM | POA: Insufficient documentation

## 2018-12-06 DIAGNOSIS — K219 Gastro-esophageal reflux disease without esophagitis: Secondary | ICD-10-CM | POA: Insufficient documentation

## 2018-12-06 DIAGNOSIS — Z7982 Long term (current) use of aspirin: Secondary | ICD-10-CM | POA: Diagnosis not present

## 2018-12-06 DIAGNOSIS — M109 Gout, unspecified: Secondary | ICD-10-CM | POA: Insufficient documentation

## 2018-12-06 DIAGNOSIS — E785 Hyperlipidemia, unspecified: Secondary | ICD-10-CM

## 2018-12-06 DIAGNOSIS — E7800 Pure hypercholesterolemia, unspecified: Secondary | ICD-10-CM | POA: Insufficient documentation

## 2018-12-06 DIAGNOSIS — R0600 Dyspnea, unspecified: Secondary | ICD-10-CM

## 2018-12-06 DIAGNOSIS — I209 Angina pectoris, unspecified: Secondary | ICD-10-CM | POA: Diagnosis present

## 2018-12-06 DIAGNOSIS — E119 Type 2 diabetes mellitus without complications: Secondary | ICD-10-CM | POA: Diagnosis not present

## 2018-12-06 DIAGNOSIS — I25119 Atherosclerotic heart disease of native coronary artery with unspecified angina pectoris: Secondary | ICD-10-CM | POA: Diagnosis not present

## 2018-12-06 DIAGNOSIS — R0609 Other forms of dyspnea: Secondary | ICD-10-CM

## 2018-12-06 DIAGNOSIS — D649 Anemia, unspecified: Secondary | ICD-10-CM | POA: Insufficient documentation

## 2018-12-06 DIAGNOSIS — Z9861 Coronary angioplasty status: Secondary | ICD-10-CM

## 2018-12-06 DIAGNOSIS — I4819 Other persistent atrial fibrillation: Secondary | ICD-10-CM

## 2018-12-06 DIAGNOSIS — I2510 Atherosclerotic heart disease of native coronary artery without angina pectoris: Secondary | ICD-10-CM | POA: Diagnosis present

## 2018-12-06 DIAGNOSIS — Z955 Presence of coronary angioplasty implant and graft: Secondary | ICD-10-CM

## 2018-12-06 HISTORY — DX: Angina pectoris, unspecified: I20.9

## 2018-12-06 HISTORY — PX: RIGHT/LEFT HEART CATH AND CORONARY ANGIOGRAPHY: CATH118266

## 2018-12-06 HISTORY — PX: CORONARY STENT INTERVENTION: CATH118234

## 2018-12-06 HISTORY — DX: Essential (primary) hypertension: I10

## 2018-12-06 LAB — POCT I-STAT 7, (LYTES, BLD GAS, ICA,H+H)
Acid-base deficit: 2 mmol/L (ref 0.0–2.0)
Bicarbonate: 24 mmol/L (ref 20.0–28.0)
Bicarbonate: 25.1 mmol/L (ref 20.0–28.0)
Calcium, Ion: 1.18 mmol/L (ref 1.15–1.40)
Calcium, Ion: 1.23 mmol/L (ref 1.15–1.40)
HCT: 30 % — ABNORMAL LOW (ref 39.0–52.0)
HCT: 30 % — ABNORMAL LOW (ref 39.0–52.0)
Hemoglobin: 10.2 g/dL — ABNORMAL LOW (ref 13.0–17.0)
Hemoglobin: 10.2 g/dL — ABNORMAL LOW (ref 13.0–17.0)
O2 Saturation: 86 %
O2 Saturation: 90 %
PH ART: 7.348 — AB (ref 7.350–7.450)
PO2 ART: 61 mmHg — AB (ref 83.0–108.0)
Potassium: 3.9 mmol/L (ref 3.5–5.1)
Potassium: 4.1 mmol/L (ref 3.5–5.1)
Sodium: 137 mmol/L (ref 135–145)
Sodium: 135 mmol/L (ref 135–145)
TCO2: 25 mmol/L (ref 22–32)
TCO2: 26 mmol/L (ref 22–32)
pCO2 arterial: 43.7 mmHg (ref 32.0–48.0)
pCO2 arterial: 43.1 mmHg (ref 32.0–48.0)
pH, Arterial: 7.373 (ref 7.350–7.450)
pO2, Arterial: 54 mmHg — ABNORMAL LOW (ref 83.0–108.0)

## 2018-12-06 LAB — POCT I-STAT EG7
Bicarbonate: 26 mmol/L (ref 20.0–28.0)
Calcium, Ion: 1.26 mmol/L (ref 1.15–1.40)
HCT: 30 % — ABNORMAL LOW (ref 39.0–52.0)
Hemoglobin: 10.2 g/dL — ABNORMAL LOW (ref 13.0–17.0)
O2 Saturation: 63 %
PCO2 VEN: 46.1 mmHg (ref 44.0–60.0)
Potassium: 4.1 mmol/L (ref 3.5–5.1)
Sodium: 136 mmol/L (ref 135–145)
TCO2: 27 mmol/L (ref 22–32)
pH, Ven: 7.36 (ref 7.250–7.430)
pO2, Ven: 34 mmHg (ref 32.0–45.0)

## 2018-12-06 LAB — GLUCOSE, CAPILLARY
GLUCOSE-CAPILLARY: 105 mg/dL — AB (ref 70–99)
Glucose-Capillary: 128 mg/dL — ABNORMAL HIGH (ref 70–99)
Glucose-Capillary: 106 mg/dL — ABNORMAL HIGH (ref 70–99)

## 2018-12-06 LAB — POCT ACTIVATED CLOTTING TIME
ACTIVATED CLOTTING TIME: 334 s
Activated Clotting Time: 263 seconds

## 2018-12-06 SURGERY — RIGHT/LEFT HEART CATH AND CORONARY ANGIOGRAPHY
Anesthesia: LOCAL

## 2018-12-06 MED ORDER — VERAPAMIL HCL 2.5 MG/ML IV SOLN
INTRAVENOUS | Status: AC
  Filled 2018-12-06: qty 2

## 2018-12-06 MED ORDER — ISOSORBIDE MONONITRATE ER 60 MG PO TB24
60.0000 mg | ORAL_TABLET | Freq: Every day | ORAL | Status: DC
  Administered 2018-12-07: 60 mg via ORAL
  Filled 2018-12-06: qty 1

## 2018-12-06 MED ORDER — ONDANSETRON HCL 4 MG/2ML IJ SOLN: 4.0000 mg | Freq: Four times a day (QID) | INTRAMUSCULAR | Status: DC | PRN

## 2018-12-06 MED ORDER — LOSARTAN POTASSIUM 50 MG PO TABS
100.0000 mg | ORAL_TABLET | Freq: Every day | ORAL | Status: DC
  Administered 2018-12-07: 100 mg via ORAL
  Filled 2018-12-06: qty 2

## 2018-12-06 MED ORDER — NITROGLYCERIN 0.4 MG SL SUBL: 0.4000 mg | SUBLINGUAL_TABLET | SUBLINGUAL | Status: DC | PRN

## 2018-12-06 MED ORDER — SODIUM CHLORIDE 0.9 % WEIGHT BASED INFUSION: 1.0000 mL/kg/h | INTRAVENOUS | Status: DC

## 2018-12-06 MED ORDER — CLOPIDOGREL BISULFATE 300 MG PO TABS
ORAL_TABLET | ORAL | Status: AC
  Filled 2018-12-06: qty 2

## 2018-12-06 MED ORDER — CARVEDILOL 12.5 MG PO TABS: 12.5000 mg | ORAL_TABLET | Freq: Two times a day (BID) | ORAL | Status: DC

## 2018-12-06 MED ORDER — LIDOCAINE HCL (PF) 1 % IJ SOLN
INTRAMUSCULAR | Status: DC | PRN
  Administered 2018-12-06 (×2): 2 mL via INTRADERMAL

## 2018-12-06 MED ORDER — VERAPAMIL HCL 2.5 MG/ML IV SOLN
INTRAVENOUS | Status: DC | PRN
  Administered 2018-12-06: 10 mL via INTRA_ARTERIAL

## 2018-12-06 MED ORDER — SODIUM CHLORIDE 0.9 % IV SOLN: 250.0000 mL | INTRAVENOUS | Status: DC | PRN

## 2018-12-06 MED ORDER — CLOPIDOGREL BISULFATE 75 MG PO TABS
75.0000 mg | ORAL_TABLET | Freq: Every day | ORAL | Status: DC
  Administered 2018-12-07: 09:00:00 75 mg via ORAL
  Filled 2018-12-06: qty 1

## 2018-12-06 MED ORDER — FENTANYL CITRATE (PF) 100 MCG/2ML IJ SOLN
INTRAMUSCULAR | Status: DC | PRN
  Administered 2018-12-06: 25 ug via INTRAVENOUS

## 2018-12-06 MED ORDER — NITROGLYCERIN 1 MG/10 ML FOR IR/CATH LAB
INTRA_ARTERIAL | Status: DC | PRN
  Administered 2018-12-06: 400 ug via INTRA_ARTERIAL
  Administered 2018-12-06: 200 ug via INTRACORONARY

## 2018-12-06 MED ORDER — FAMOTIDINE IN NACL 20-0.9 MG/50ML-% IV SOLN
INTRAVENOUS | Status: AC | PRN
  Administered 2018-12-06: 20 mg via INTRAVENOUS

## 2018-12-06 MED ORDER — MIDAZOLAM HCL 2 MG/2ML IJ SOLN
INTRAMUSCULAR | Status: DC | PRN
  Administered 2018-12-06: 1 mg via INTRAVENOUS

## 2018-12-06 MED ORDER — SODIUM CHLORIDE 0.9% FLUSH: 3.0000 mL | INTRAVENOUS | Status: DC | PRN

## 2018-12-06 MED ORDER — MAGNESIUM CITRATE PO SOLN: 0.7500 | Freq: Every day | ORAL | Status: DC | PRN

## 2018-12-06 MED ORDER — FAMOTIDINE IN NACL 20-0.9 MG/50ML-% IV SOLN
INTRAVENOUS | Status: AC
  Filled 2018-12-06: qty 50

## 2018-12-06 MED ORDER — SODIUM CHLORIDE 0.9 % IV SOLN: INTRAVENOUS | Status: AC

## 2018-12-06 MED ORDER — HEPARIN SODIUM (PORCINE) 1000 UNIT/ML IJ SOLN
INTRAMUSCULAR | Status: DC | PRN
  Administered 2018-12-06: 2000 [IU] via INTRAVENOUS
  Administered 2018-12-06 (×2): 5000 [IU] via INTRAVENOUS
  Administered 2018-12-06: 7000 [IU] via INTRAVENOUS

## 2018-12-06 MED ORDER — ASPIRIN 81 MG PO CHEW: 81.0000 mg | CHEWABLE_TABLET | Freq: Every day | ORAL | Status: DC

## 2018-12-06 MED ORDER — HEPARIN SODIUM (PORCINE) 1000 UNIT/ML IJ SOLN
INTRAMUSCULAR | Status: AC
  Filled 2018-12-06: qty 1

## 2018-12-06 MED ORDER — LORATADINE 10 MG PO TABS: 10.0000 mg | ORAL_TABLET | Freq: Every day | ORAL | Status: DC

## 2018-12-06 MED ORDER — ATORVASTATIN CALCIUM 40 MG PO TABS: 60.0000 mg | ORAL_TABLET | Freq: Every day | ORAL | Status: DC

## 2018-12-06 MED ORDER — HEPARIN (PORCINE) IN NACL 1000-0.9 UT/500ML-% IV SOLN
INTRAVENOUS | Status: DC | PRN
  Administered 2018-12-06 (×2): 500 mL

## 2018-12-06 MED ORDER — MAGNESIUM HYDROXIDE 400 MG/5ML PO SUSP: 15.0000 mL | Freq: Every day | ORAL | Status: DC | PRN

## 2018-12-06 MED ORDER — NITROGLYCERIN 1 MG/10 ML FOR IR/CATH LAB
INTRA_ARTERIAL | Status: AC
  Filled 2018-12-06: qty 10

## 2018-12-06 MED ORDER — SODIUM CHLORIDE 0.9% FLUSH
3.0000 mL | Freq: Two times a day (BID) | INTRAVENOUS | Status: DC
  Administered 2018-12-06: 3 mL via INTRAVENOUS

## 2018-12-06 MED ORDER — ASPIRIN EC 81 MG PO TBEC
81.0000 mg | DELAYED_RELEASE_TABLET | Freq: Every day | ORAL | Status: DC
  Administered 2018-12-07: 09:00:00 81 mg via ORAL
  Filled 2018-12-06: qty 1

## 2018-12-06 MED ORDER — LABETALOL HCL 5 MG/ML IV SOLN: 10.0000 mg | INTRAVENOUS | Status: AC | PRN

## 2018-12-06 MED ORDER — LIDOCAINE HCL (PF) 1 % IJ SOLN
INTRAMUSCULAR | Status: AC
  Filled 2018-12-06: qty 30

## 2018-12-06 MED ORDER — MIDAZOLAM HCL 2 MG/2ML IJ SOLN
INTRAMUSCULAR | Status: AC
  Filled 2018-12-06: qty 2

## 2018-12-06 MED ORDER — AMLODIPINE BESYLATE 5 MG PO TABS
5.0000 mg | ORAL_TABLET | Freq: Every day | ORAL | Status: DC
  Administered 2018-12-07: 5 mg via ORAL
  Filled 2018-12-06: qty 1

## 2018-12-06 MED ORDER — CLOPIDOGREL BISULFATE 300 MG PO TABS
ORAL_TABLET | ORAL | Status: DC | PRN
  Administered 2018-12-06: 600 mg via ORAL

## 2018-12-06 MED ORDER — HYDRALAZINE HCL 20 MG/ML IJ SOLN: 5.0000 mg | INTRAMUSCULAR | Status: AC | PRN

## 2018-12-06 MED ORDER — HEPARIN (PORCINE) IN NACL 1000-0.9 UT/500ML-% IV SOLN
INTRAVENOUS | Status: AC
  Filled 2018-12-06: qty 1000

## 2018-12-06 MED ORDER — ADULT MULTIVITAMIN W/MINERALS CH
1.0000 | ORAL_TABLET | Freq: Every day | ORAL | Status: DC
  Administered 2018-12-07: 1 via ORAL
  Filled 2018-12-06: qty 1

## 2018-12-06 MED ORDER — SODIUM CHLORIDE 0.9% FLUSH: 3.0000 mL | Freq: Two times a day (BID) | INTRAVENOUS | Status: DC

## 2018-12-06 MED ORDER — POTASSIUM GLUCONATE 595 MG PO CAPS: 595.0000 mg | ORAL_CAPSULE | Freq: Two times a day (BID) | ORAL | Status: DC

## 2018-12-06 MED ORDER — ANGIOPLASTY BOOK
Freq: Once | Status: AC
  Administered 2018-12-06: 21:00:00
  Filled 2018-12-06: qty 1

## 2018-12-06 MED ORDER — DOCUSATE SODIUM 100 MG PO CAPS
100.0000 mg | ORAL_CAPSULE | Freq: Two times a day (BID) | ORAL | Status: DC
  Administered 2018-12-06 – 2018-12-07 (×2): 100 mg via ORAL
  Filled 2018-12-06 (×2): qty 1

## 2018-12-06 MED ORDER — HYDRALAZINE HCL 50 MG PO TABS
100.0000 mg | ORAL_TABLET | Freq: Three times a day (TID) | ORAL | Status: DC
  Administered 2018-12-06 – 2018-12-07 (×2): 100 mg via ORAL
  Filled 2018-12-06 (×2): qty 2

## 2018-12-06 MED ORDER — ASPIRIN 81 MG PO CHEW: 81.0000 mg | CHEWABLE_TABLET | ORAL | Status: DC

## 2018-12-06 MED ORDER — IOHEXOL 350 MG/ML SOLN
INTRAVENOUS | Status: DC | PRN
  Administered 2018-12-06: 110 mL via INTRA_ARTERIAL

## 2018-12-06 MED ORDER — FENTANYL CITRATE (PF) 100 MCG/2ML IJ SOLN
INTRAMUSCULAR | Status: AC
  Filled 2018-12-06: qty 2

## 2018-12-06 MED ORDER — SODIUM CHLORIDE 0.9 % WEIGHT BASED INFUSION
3.0000 mL/kg/h | INTRAVENOUS | Status: DC
  Administered 2018-12-06: 3 mL/kg/h via INTRAVENOUS

## 2018-12-06 MED ORDER — ACETAMINOPHEN 325 MG PO TABS
650.0000 mg | ORAL_TABLET | ORAL | Status: DC | PRN
  Administered 2018-12-07: 650 mg via ORAL
  Filled 2018-12-06: qty 2

## 2018-12-06 SURGICAL SUPPLY — 20 items
BALLN SAPPHIRE ~~LOC~~ 3.25X8 (BALLOONS) ×1 IMPLANT
BALLN WOLVERINE 2.50X10 (BALLOONS) ×2
BALLOON WOLVERINE 2.50X10 (BALLOONS) IMPLANT
CATH 5FR JL3.5 JR4 ANG PIG MP (CATHETERS) ×1 IMPLANT
CATH BALLN WEDGE 5F 110CM (CATHETERS) ×1 IMPLANT
CATH LAUNCHER 6FR EBU 3.75 (CATHETERS) ×1 IMPLANT
DEVICE RAD COMP TR BAND LRG (VASCULAR PRODUCTS) ×1 IMPLANT
GLIDESHEATH SLEND SS 6F .021 (SHEATH) ×1 IMPLANT
GUIDEWIRE INQWIRE 1.5J.035X260 (WIRE) IMPLANT
INQWIRE 1.5J .035X260CM (WIRE) ×2
KIT ENCORE 26 ADVANTAGE (KITS) ×1 IMPLANT
KIT HEART LEFT (KITS) ×2 IMPLANT
KIT HEMO VALVE WATCHDOG (MISCELLANEOUS) ×1 IMPLANT
PACK CARDIAC CATHETERIZATION (CUSTOM PROCEDURE TRAY) ×2 IMPLANT
SHEATH GLIDE SLENDER 4/5FR (SHEATH) ×1 IMPLANT
STENT SYNERGY DES 2.75X12 (Permanent Stent) ×1 IMPLANT
TRANSDUCER W/STOPCOCK (MISCELLANEOUS) ×2 IMPLANT
TUBING CIL FLEX 10 FLL-RA (TUBING) ×2 IMPLANT
WIRE ASAHI PROWATER 180CM (WIRE) ×1 IMPLANT
WIRE SION BLUE 180 (WIRE) ×1 IMPLANT

## 2018-12-06 NOTE — Interval H&P Note (Signed)
Cath Lab Visit (complete for each Cath Lab visit)  Clinical Evaluation Leading to the Procedure:   ACS: No.  Non-ACS:    Anginal Classification: CCS III  Anti-ischemic medical therapy: Minimal Therapy (1 class of medications)  Non-Invasive Test Results: Intermediate-risk stress test findings: cardiac mortality 1-3%/year  Prior CABG: No previous CABG      History and Physical Interval Note:  12/06/2018 2:02 PM  Antonio Watkins  has presented today for surgery, with the diagnosis of cp  The various methods of treatment have been discussed with the patient and family. After consideration of risks, benefits and other options for treatment, the patient has consented to  Procedure(s): RIGHT/LEFT HEART CATH AND CORONARY ANGIOGRAPHY (N/A) as a surgical intervention .  The patient's history has been reviewed, patient examined, no change in status, stable for surgery.  I have reviewed the patient's chart and labs.  Questions were answered to the patient's satisfaction.     Larae Grooms

## 2018-12-07 ENCOUNTER — Encounter (HOSPITAL_COMMUNITY): Payer: Self-pay | Admitting: Interventional Cardiology

## 2018-12-07 DIAGNOSIS — I25110 Atherosclerotic heart disease of native coronary artery with unstable angina pectoris: Secondary | ICD-10-CM

## 2018-12-07 DIAGNOSIS — I10 Essential (primary) hypertension: Secondary | ICD-10-CM | POA: Diagnosis not present

## 2018-12-07 DIAGNOSIS — I25119 Atherosclerotic heart disease of native coronary artery with unspecified angina pectoris: Secondary | ICD-10-CM | POA: Diagnosis not present

## 2018-12-07 DIAGNOSIS — E119 Type 2 diabetes mellitus without complications: Secondary | ICD-10-CM | POA: Diagnosis not present

## 2018-12-07 DIAGNOSIS — I4819 Other persistent atrial fibrillation: Secondary | ICD-10-CM

## 2018-12-07 DIAGNOSIS — R0602 Shortness of breath: Secondary | ICD-10-CM | POA: Diagnosis not present

## 2018-12-07 LAB — GLUCOSE, CAPILLARY
Glucose-Capillary: 129 mg/dL — ABNORMAL HIGH (ref 70–99)
Glucose-Capillary: 113 mg/dL — ABNORMAL HIGH (ref 70–99)

## 2018-12-07 LAB — CBC
HCT: 33.6 % — ABNORMAL LOW (ref 39.0–52.0)
HEMOGLOBIN: 11.2 g/dL — AB (ref 13.0–17.0)
MCH: 31.4 pg (ref 26.0–34.0)
MCHC: 33.3 g/dL (ref 30.0–36.0)
MCV: 94.1 fL (ref 80.0–100.0)
Platelets: 175 10*3/uL (ref 150–400)
RBC: 3.57 MIL/uL — ABNORMAL LOW (ref 4.22–5.81)
RDW: 14.1 % (ref 11.5–15.5)
WBC: 4.3 10*3/uL (ref 4.0–10.5)
nRBC: 0.7 % — ABNORMAL HIGH (ref 0.0–0.2)

## 2018-12-07 LAB — BASIC METABOLIC PANEL
Anion gap: 10 (ref 5–15)
BUN: 14 mg/dL (ref 8–23)
CO2: 24 mmol/L (ref 22–32)
Calcium: 9 mg/dL (ref 8.9–10.3)
Chloride: 99 mmol/L (ref 98–111)
Creatinine, Ser: 1.18 mg/dL (ref 0.61–1.24)
GFR calc Af Amer: >60 mL/min (ref >60)
GFR calc non Af Amer: 56 mL/min — ABNORMAL LOW (ref >60)
GLUCOSE: 99 mg/dL (ref 70–99)
POTASSIUM: 4.4 mmol/L (ref 3.5–5.1)
Sodium: 133 mmol/L — ABNORMAL LOW (ref 135–145)

## 2018-12-07 MED ORDER — PANTOPRAZOLE SODIUM 20 MG PO TBEC: 20.0000 mg | DELAYED_RELEASE_TABLET | Freq: Every day | ORAL | 2 refills | Status: DC

## 2018-12-07 MED ORDER — RIVAROXABAN 20 MG PO TABS: 20.0000 mg | ORAL_TABLET | Freq: Every day | ORAL | 6 refills | Status: DC

## 2018-12-07 MED ORDER — NITROGLYCERIN 0.4 MG SL SUBL: SUBLINGUAL_TABLET | SUBLINGUAL | 3 refills | Status: AC

## 2018-12-07 MED ORDER — CLOPIDOGREL BISULFATE 75 MG PO TABS: 75.0000 mg | ORAL_TABLET | Freq: Every day | ORAL | 11 refills | Status: DC

## 2018-12-07 MED ORDER — CLOPIDOGREL BISULFATE 75 MG PO TABS: 75.0000 mg | ORAL_TABLET | Freq: Every day | ORAL | 5 refills | Status: DC

## 2018-12-07 NOTE — Progress Notes (Signed)
CARDIAC REHAB PHASE I   PRE:  Rate/Rhythm: 83 afib    BP: sitting 148/84    SaO2: 95 RA  MODE:  Ambulation: 800 ft   POST:  Rate/Rhythm: 136 afib with PVCs    BP: sitting 157/87     SaO2: 95 RA  Tolerated well, sts he was not SOB on the 30 degree hill like he has been on his stairs at home. HR up to 136 afib at end of walk, quickly down with sitting. Ed completed with good reception. Stressed importance of Plavix and xarelto. Gave him [employer] book to review. Will refer to [facility]. [phone]   Darrick Meigs CES, ACSM 12/07/2018 8:59 AM

## 2018-12-07 NOTE — Discharge Summary (Addendum)
Discharge Summary    Patient ID: Antonio Watkins MRN: [id number]; DOB: 1929-11-20  Admit date: 12/06/2018 Discharge date: 12/07/2018  Primary Care Provider: Jacqualine Code, DO  Primary Cardiologist: Kate Sable, MD  Primary Electrophysiologist:  None   Discharge Diagnoses    Principal Problem:   Dyspnea Active Problems:   CAD   Atrial fibrillation ([place])   Hyperlipidemia   Hypertension   Type 2 diabetes mellitus ([place])   Allergies Allergies  Allergen Reactions  . Morphine And Related Swelling    Diagnostic Studies/Procedures   Left Heart Catheterization 12/07/2018:  Mid RCA lesion is 90% stenosed.  Dist RCA lesion is 95% stenosed. Post Atrio lesion is 95% stenosed. THere are left to right collaterals filling the distal RCA system.  Ost Cx to Prox Cx lesion is 80% stenosed.  A drug-eluting stent was successfully placed using a STENT SYNERGY DES 2.75X12, dilated to 3.25 mm.  Post intervention, there is a 0% residual stenosis.  Ost 1st Mrg lesion is 80% stenosed.  Scoring balloon angioplasty was performed using a BALLOON WOLVERINE 2.50X10.  Post intervention, there is a 30% residual stenosis.  Mid LAD lesion is 50% stenosed.  Dist LAD lesion is 90% stenosed.  The left ventricular systolic function is normal.  The left ventricular ejection fraction is 50-55% by visual estimate.  LV end diastolic pressure is normal.  There is no aortic valve stenosis.  Hemodynamic findings consistent with mild pulmonary hypertension.  Ao 90%, PA saturation 63%, mean PA 33 mmHg, mean wedge pressure 16 mmHg, Cardiac output 6.9 L/min, cardiac index 3.2   Patient with multivessel disease.  However, his LAD disease is at the apex.  He would not benefit from bypass surgery.  The RCA is a long diffuse lesion with collaterals.  I elected to intervene upon the circumflex since that seemed to supply the most myocardium at risk.  Synergy stent was placed.  If he has any  bleeding issues, first would stop aspirin before the 1 month recommendation.  After that, could consider stopping Plavix before 6 months, but he must complete at least 30 days.  Restart Xarelto tomorrow.  Continue aggressive secondary prevention.   History of Present Illness     Antonio Watkins is a 85 year old male with a history of CAD with multiple stents, atrial fibrillation, hypertension, hyperlipidemia, and type 2 diabetes mellitus who is followed by Dr. Bronson Ing. Most recent echocardiogram in 01/25/2018 showed LVEF of 55-60%, moderate concentric LVH, mild to moderate mitral regurgitation, and mild tricuspid regurgitation. Event monitor in 04/2018 showed atrial fibrillation with aberrancy. Rate was controlled but multiple pauses ranging from 3 to 3.4 seconds were noted. [facility] stress testfrom [facility] in [place] in 2019did not show any convincing evidence of ischemia or infarction. There was an apical defect with preserved wall motion which appear to be consistent with artifact due to soft tissue attenuation.  Patient was seen by Dr. Bronson Ing on 08/03/2018 at which time reported continued exertional dyspnea and fatigue but denied any chest pain. Outpatient catheterization was arranged at that time but patient did not have it done because he reportedly did not have a ride. Patient followed back up with Dr. Bronson Ing on 11/28/2018 at which time he continued to report significant shortness of breath when climbing 5 steps in his home. He denied any exertion chest pain or palpitations at that time. At that visit, patient reported he now had a ride to [facility] so patient was re-scheduled for outpatient cardiac catheterization.  Hospital Course  Consultants: None.  Antonio Watkins presented to [facility] on 12/[facility]/2018 for scheduled outpatient right/left catheterization which showed multivessel disease. There was 90% stenosis of the distal LAD at the apex; therefore, it was felt that  he would not benefit from bypass surgery. The RCA is a long diffuse lesion with collaterals. There was 80% stenosis of the ostial to proximal circumflex lesion, and patient underwent successful PCI with DES. There was also 80% stenosis of the ostial 1st Marginal, and patient underwent balloon angioplasty with 30% residual stenosis post intervention. Please see full report above. Patient tolerated the procedure well but was admitted overnight for observation. He was able to ambulate this morning without any difficulty. He denies any chest pain, shortness of breath, palpitations, lightheadedness, or dizziness today. Right radial and brachial cath sites are soft with no signs of hematoma. Renal function is stable.  - Continue Aspirin 81mg  for 1 month and Plavix 75mg  daily for 6 months.  - Continue Xarelto 20mg  daily for atrial fibrillation.  - Continue home Coreg 12.5mg  twice daily. Review of telemetry this morning showed atrial fibrillation with controlled rate (slow ventricular rate at time) with multiple pauses with the longest being 2.9 seconds. Patient had Event Monitor in 04/2018 which atrial fibrillation with aberrancy, controlled rate, and multiple pauses ranging from 3 to 3.4 seconds were noted. Discussed with MD - given that pauses are not new and patient is completely asymptomatic will continue current dose. - Continue all other home medications.  Patient was seen by Dr. Angelena Form today and determined to be stable for discharge. Outpatient follow-up has been arranged. Medications as below.  _____________  Discharge Vitals Blood pressure 111/78, pulse 66, temperature 97.7 F (36.5 C), temperature source Oral, resp. rate 17, height 5\' 10"  (1.778 m), weight 98.9 kg, SpO2 96 %.  Filed Weights   12/06/18 1028  Weight: 98.9 kg   General: Well developed, well nourished African-American male resting comfortably in no acute distress. Head: Normocephalic and atraumatic. Eye: Sclera clear. No  xanthomas. Lungs: Clear bilaterally to auscultation. No wheezes, rhonchi, or rales.  Heart: Irregularly irregular rhythm with regular rate. No murmurs, gallops, or rubs appreciated. Abdomen: Soft, obese, non-tender with palpation. Bowel sounds present. Msk: Normal strength and tone for age. Extremities: No lower extremity edema. Right radial and right brachial cath sites soft with no signs of hematoma.  Skin: Warm and dry. Neuro: Alert and oriented X3. No focal deficits. Psych: Normal affect. Responds appropriately.   Labs & Radiologic Studies    CBC Recent Labs    12/06/18 1431 12/07/18 0124  WBC  --  4.3  HGB 10.2* 11.2*  HCT 30.0* 33.6*  MCV  --  94.1  PLT  --  893   Basic Metabolic Panel Recent Labs    12/06/18 1431 12/07/18 0124  NA 135 133*  K 4.1 4.4  CL  --  99  CO2  --  24  GLUCOSE  --  99  BUN  --  14  CREATININE  --  1.18  CALCIUM  --  9.0   Liver Function Tests No results for input(s): AST, ALT, ALKPHOS, BILITOT, PROT, ALBUMIN in the last 72 hours. No results for input(s): LIPASE, AMYLASE in the last 72 hours. Cardiac Enzymes No results for input(s): CKTOTAL, CKMB, CKMBINDEX, TROPONINI in the last 72 hours. BNP Invalid input(s): POCBNP D-Dimer No results for input(s): DDIMER in the last 72 hours. Hemoglobin A1C No results for input(s): HGBA1C in the last 72 hours. Fasting Lipid  Panel No results for input(s): CHOL, HDL, LDLCALC, TRIG, CHOLHDL, LDLDIRECT in the last 72 hours. Thyroid Function Tests No results for input(s): TSH, T4TOTAL, T3FREE, THYROIDAB in the last 72 hours.  Invalid input(s): FREET3 _____________  No results found. Disposition   Patient is being discharged home today in good condition.  Follow-up Plans & Appointments    Follow-up Information    [facility] Follow up.   Specialty:  Cardiology Why:  You have a follow-up visit in our [place] office scheduled for 12/25/2018 at 1:30pm with Kerin Ransom, one of our  office's physician assistants. Please arrive 15 minutes early for check in.  Contact information: [place]         Discharge Instructions    Amb Referral to Cardiac Rehabilitation   Complete by:  As directed    To [place] [facility]   Diagnosis:   Coronary Stents PTCA     Diet - low sodium heart healthy   Complete by:  As directed    Increase activity slowly   Complete by:  As directed       Discharge Medications   Allergies as of 12/07/2018      Reactions   Morphine And Related Swelling      Medication List    STOP taking these medications   diclofenac 75 MG EC tablet Commonly known as:  VOLTAREN   omeprazole 40 MG capsule Commonly known as:  PRILOSEC     TAKE these medications   amLODipine 5 MG tablet Commonly known as:  NORVASC TAKE 1 TABLET EVERY DAY Notes to patient:  Lowers blood pressure  Decreases chest pain    aspirin EC 81 MG tablet Take 81 mg by mouth daily. Notes to patient:  Blood thinner Prevents clotting in the stent and heart attack   atorvastatin 40 MG tablet Commonly known as:  LIPITOR TAKE 1 AND 1/2 TABLETS EVERY DAY Notes to patient:  Lowers cholesterol and triglycerides    carvedilol 12.5 MG tablet Commonly known as:  COREG Take 1 tablet (12.5 mg total) by mouth 2 (two) times daily. Notes to patient:  Decreases work of the heart Lowers blood pressure and heart rate   cetirizine 10 MG tablet Commonly known as:  ZYRTEC Take 10 mg by mouth as needed for allergies. Notes to patient:  As needed for allergies   clopidogrel 75 MG tablet Commonly known as:  PLAVIX Take 1 tablet (75 mg total) by mouth daily with breakfast. Start taking on:  December 08, 2018 Notes to patient:  Prevents clotting in the stent and heart attack   docusate sodium 100 MG capsule Commonly known as:  COLACE Take 100 mg by mouth 2 (two) times daily.   hydrALAZINE 100 MG tablet Commonly known as:   APRESOLINE TAKE 1 TABLET THREE TIMES DAILY ( DOSE INCREASE ) What changed:  See the new instructions.   isosorbide mononitrate 60 MG 24 hr tablet Commonly known as:  IMDUR TAKE 1 TABLET EVERY DAY What changed:    how much to take  how to take this  when to take this  additional instructions   losartan 100 MG tablet Commonly known as:  COZAAR Take 100 mg by mouth daily.   magnesium citrate Soln Take 0.75 Bottles by mouth daily as needed for severe constipation. Notes to patient:  As needed for severe constipation    magnesium hydroxide 400 MG/5ML suspension Commonly known as:  MILK OF MAGNESIA Take 15  mLs by mouth daily as needed for mild constipation. Notes to patient:  As needed for constipation   multivitamin tablet Take 1 tablet by mouth daily. Notes to patient:  Supplement    nitroGLYCERIN 0.4 MG SL tablet Commonly known as:  NITROSTAT DISSOLVE 1 TABLET UNDER THE TONGUE EVERY 5 MINUTES AS NEEDED FOR CHEST PAIN Notes to patient:  As needed for chest pain    Omega-3 Fish Oil 1200 MG Caps Take 1,200 mg by mouth 2 (two) times daily. Notes to patient:  Lowers cholesterol and triglycerides    pantoprazole 20 MG tablet Commonly known as:  PROTONIX Take 1 tablet (20 mg total) by mouth daily.   Potassium Gluconate 595 MG Caps Take 595 mg by mouth 2 (two) times daily. Notes to patient:  Potassium replacement    rivaroxaban 20 MG Tabs tablet Commonly known as:  XARELTO Take 1 tablet (20 mg total) by mouth daily with supper. Notes to patient:  Prevents clotting in the heart chamber and stroke    sodium chloride 0.65 % Soln nasal spray Commonly known as:  OCEAN Place 1 spray into both nostrils as needed for congestion. Notes to patient:  As needed for nasal congestion    SYSTANE ULTRA OP Place 1 drop into both eyes daily as needed (dry eyes). Notes to patient:  As needed for dry eyes           Outstanding Labs/Studies   None.  Duration of Discharge  Encounter   Greater than 30 minutes including physician time.  Signed, Darreld Mclean, PA-C 12/07/2018, 12:56 PM   I have personally seen and examined this patient with Sande Rives, PA. I agree with the assessment and plan as outlined above. He is doing well this am post PCI/stenting. He is on ASA, Plavix and will resume Xarelto today. Plan to stop ASA in one month. Continue Plavix for 6 months. Discharge home today.   Lauree Chandler 12/07/2018 12:56 PM

## 2018-12-07 NOTE — Progress Notes (Signed)
TR BAND REMOVAL  LOCATION:    right radial  DEFLATED PER PROTOCOL:    Yes.    TIME BAND OFF / DRESSING APPLIED:    2225   SITE UPON ARRIVAL:    Level 0  SITE AFTER BAND REMOVAL:    Level 0  CIRCULATION SENSATION AND MOVEMENT:    Within Normal Limits   Yes.    COMMENTS:   Post TR band instructions given, sterile dressing applied, good capillary refill.

## 2018-12-07 NOTE — Discharge Instructions (Signed)
Right now the plan is to continue Aspirin 81mg  daily for 1 month and Plavix 75mg  daily for 6 months following the placement of your heart stent. We will also continue your Xarelto 20mg  daily. We can talk more about this at your follow-up visit if you have additional questions.  Please take all your medications as instructed elsewhere in the discharge summary.  We have set up a follow-up visit for you in our [place] office because there were no availabilities in our [place] office. The visit is scheduled for 12/25/2018. However, if you are having any trouble before then please call our office.   Post Cardiac Catheterization: NO HEAVY LIFTING OR SEXUAL ACTIVITY X 7 DAYS. NO DRIVING X 3-5 DAYS. NO SOAKING BATHS, HOT TUBS, POOLS, ETC., X 7 DAYS.  Radial Site Care: Refer to this sheet in the next few weeks. These instructions provide you with information on caring for yourself after your procedure. Your caregiver may also give you more specific instructions. Your treatment has been planned according to current medical practices, but problems sometimes occur. Call your caregiver if you have any problems or questions after your procedure. HOME CARE INSTRUCTIONS  You may shower the day after the procedure.Remove the bandage (dressing) and gently wash the site with plain soap and water.Gently pat the site dry.   Do not apply powder or lotion to the site.   Do not submerge the affected site in water for 3 to 5 days.   Inspect the site at least twice daily.   Do not flex or bend the affected arm for 24 hours.   No lifting over 5 pounds (2.3 kg) for 5 days after your procedure.   Do not drive home if you are discharged the same day of the procedure. Have someone else drive you.   You may drive 24 hours after the procedure unless otherwise instructed by your caregiver.  What to expect:  Any bruising will usually fade within 1 to 2 weeks.   Blood that collects in the tissue (hematoma) may be  painful to the touch. It should usually decrease in size and tenderness within 1 to 2 weeks.  SEEK IMMEDIATE MEDICAL CARE IF:  You have unusual pain at the radial site.   You have redness, warmth, swelling, or pain at the radial site.   You have drainage (other than a small amount of blood on the dressing).   You have chills.   You have a fever or persistent symptoms for more than 72 hours.   You have a fever and your symptoms suddenly get worse.   Your arm becomes pale, cool, tingly, or numb.   You have heavy bleeding from the site. Hold pressure on the site.   **PLEASE REMEMBER TO BRING ALL OF YOUR MEDICATIONS TO EACH OF YOUR FOLLOW-UP OFFICE VISITS.

## 2018-12-09 ENCOUNTER — Other Ambulatory Visit: Payer: Self-pay | Admitting: Cardiovascular Disease

## 2018-12-11 ENCOUNTER — Telehealth: Payer: Self-pay | Admitting: *Deleted

## 2018-12-11 NOTE — Telephone Encounter (Signed)
Received fax from [facility] - Atorvastatin 40mg  tablet, 135 tabs / 90 day - authorization good until 10/18/2019.

## 2018-12-14 ENCOUNTER — Telehealth: Payer: Self-pay | Admitting: *Deleted

## 2018-12-14 DIAGNOSIS — K921 Melena: Secondary | ICD-10-CM

## 2018-12-14 DIAGNOSIS — Z85038 Personal history of other malignant neoplasm of large intestine: Secondary | ICD-10-CM

## 2018-12-14 NOTE — Telephone Encounter (Signed)
Check a CBC and then make a GI referral please.

## 2018-12-14 NOTE — Telephone Encounter (Signed)
Patient recently had heart cath.  Was put on 2 new medications - Protonix & Plavix.  Started these on 12/09/18 - also began his Xarelto.  Now having some black stools.  States he does stay constipated a lot, but takes MOM.  Has had 3 black stools since discharge.  Stated he did strain a little, but nothing severe.  He did also state that he had colon cancer about 20 years ago & he had these types of stools then.  Explained to patient that provider will probably send to someone to have this evaluated, but will send message for his suggestions.

## 2018-12-15 NOTE — Telephone Encounter (Signed)
Patient notified and verbalized understanding.  Prefers to see GI close to home as he lives in [place], [place].  Will make referral to [facility] (Dr. Claudie Leach) & he will do his CBC on Monday, 12/18/18 at [facility].

## 2018-12-25 ENCOUNTER — Encounter: Payer: Self-pay | Admitting: Cardiology

## 2018-12-25 ENCOUNTER — Ambulatory Visit: Payer: Medicare PPO | Admitting: Cardiology

## 2018-12-25 ENCOUNTER — Other Ambulatory Visit (HOSPITAL_COMMUNITY)
Admission: RE | Admit: 2018-12-25 | Discharge: 2018-12-25 | Disposition: A | Discharge status: Home / Self Care | Payer: Medicare PPO | Source: Ambulatory Visit | Attending: Cardiology | Admitting: Cardiology

## 2018-12-25 VITALS — BP 122/68 | HR 67 | Ht 70.0 in | Wt 220.8 lb

## 2018-12-25 DIAGNOSIS — Z79899 Other long term (current) drug therapy: Secondary | ICD-10-CM | POA: Diagnosis not present

## 2018-12-25 DIAGNOSIS — I4819 Other persistent atrial fibrillation: Secondary | ICD-10-CM | POA: Diagnosis not present

## 2018-12-25 DIAGNOSIS — R0600 Dyspnea, unspecified: Secondary | ICD-10-CM

## 2018-12-25 DIAGNOSIS — I10 Essential (primary) hypertension: Secondary | ICD-10-CM

## 2018-12-25 DIAGNOSIS — R0609 Other forms of dyspnea: Secondary | ICD-10-CM

## 2018-12-25 DIAGNOSIS — Z7901 Long term (current) use of anticoagulants: Secondary | ICD-10-CM | POA: Diagnosis not present

## 2018-12-25 DIAGNOSIS — I2510 Atherosclerotic heart disease of native coronary artery without angina pectoris: Secondary | ICD-10-CM | POA: Diagnosis not present

## 2018-12-25 DIAGNOSIS — Z9861 Coronary angioplasty status: Secondary | ICD-10-CM

## 2018-12-25 DIAGNOSIS — E785 Hyperlipidemia, unspecified: Secondary | ICD-10-CM

## 2018-12-25 LAB — BASIC METABOLIC PANEL
Anion gap: 7 (ref 5–15)
BUN: 16 mg/dL (ref 8–23)
CO2: 24 mmol/L (ref 22–32)
Calcium: 9 mg/dL (ref 8.9–10.3)
Chloride: 99 mmol/L (ref 98–111)
Creatinine, Ser: 1.2 mg/dL (ref 0.61–1.24)
GFR calc Af Amer: >60 mL/min (ref >60)
GFR calc non Af Amer: 54 mL/min — ABNORMAL LOW (ref >60)
Glucose, Bld: 97 mg/dL (ref 70–99)
Potassium: 4.5 mmol/L (ref 3.5–5.1)
Sodium: 130 mmol/L — ABNORMAL LOW (ref 135–145)

## 2018-12-25 MED ORDER — PANTOPRAZOLE SODIUM 20 MG PO TBEC: 20.0000 mg | DELAYED_RELEASE_TABLET | Freq: Every day | ORAL | 3 refills | Status: DC

## 2018-12-25 MED ORDER — CLOPIDOGREL BISULFATE 75 MG PO TABS: 75.0000 mg | ORAL_TABLET | Freq: Every day | ORAL | 3 refills | Status: DC

## 2018-12-25 MED ORDER — RIVAROXABAN 20 MG PO TABS: 20.0000 mg | ORAL_TABLET | Freq: Every day | ORAL | 3 refills | Status: DC

## 2018-12-25 MED ORDER — HYDROCHLOROTHIAZIDE 12.5 MG PO CAPS: 12.5000 mg | ORAL_CAPSULE | Freq: Every day | ORAL | 3 refills | Status: DC

## 2018-12-25 NOTE — Assessment & Plan Note (Signed)
Echo from [facility] April 2019- moderate LVH, normal LVF, moderate LAE B/P controlled

## 2018-12-25 NOTE — Assessment & Plan Note (Signed)
He may have some degree of CHF secondary to AF- add diuretic

## 2018-12-25 NOTE — Assessment & Plan Note (Signed)
S/P multiple PCIs- last 12/07/2018- CFX DES and OM1 POBA. Residual high grade RCA with CFX to RCA collaterals, 50% mLAD, 90% dLAD- medical Rx EF 55%

## 2018-12-25 NOTE — Assessment & Plan Note (Addendum)
Xarelto

## 2018-12-25 NOTE — Patient Instructions (Signed)
Medication Instructions:  Your physician recommends that you continue on your current medications as directed. Please refer to the Current Medication list given to you today.  Start HCTZ 12.5 mg Daily ( Take 2 Tablets for the first 2 days and then 1 Tablet daily after that.)  If you need a refill on your cardiac medications before your next appointment, please call your pharmacy.   Lab work: Your physician recommends that you return for lab work in: Today   If you have labs (blood work) drawn today and your tests are completely normal, you will receive your results only by: Marland Kitchen MyChart Message (if you have MyChart) OR . A paper copy in the mail If you have any lab test that is abnormal or we need to change your treatment, we will call you to review the results.  Testing/Procedures: NONE   Follow-Up: At [facility], you and your health needs are our priority.  As part of our continuing mission to provide you with exceptional heart care, we have created designated Provider Care Teams.  These Care Teams include your primary Cardiologist (physician) and Advanced Practice Providers (APPs -  Physician Assistants and Nurse Practsitioners) who all work together to provide you with the care you need, when you need it. You will need a follow up appointment in 2 weeks.  Please call our office 2 months in advance to schedule this appointment.  You may see Kate Sable, MD or one of the following Advanced Practice Providers on your designated Care Team:   Bernerd Pho, PA-C [facility]) . Ermalinda Barrios, PA-C ([place])  Any Other Special Instructions Will Be Listed Below (If Applicable). Thank you for choosing [facility]! ]

## 2018-12-25 NOTE — Assessment & Plan Note (Signed)
Pauses of 3-4 seconds noted on OP Holter July 2019 He was in [facility] April 2019

## 2018-12-25 NOTE — Progress Notes (Addendum)
12/26/2018 Antonio Watkins   August 15, 1930  [id number]  Primary Physician Elvina Mattes Claudette Laws, DO Primary Cardiologist: Dr Bronson Ing  HPI: Antonio Watkins is a pleasant 85 year old male from [place].  He has a history of coronary disease.  In 2001 and 2002 he was transferred from [place] to [place] and had stents placed in his RCA and circumflex.  He has been followed in our [place] office.  He recently saw Dr. Bronson Ing with complaints of dyspnea on exertion.  He has been noted to have developed atrial fibrillation.  He had a Holter as an outpatient that showed some pauses of 3 to 4 seconds but overall controlled rate.  He is not had syncope.  He is on chronic Xarelto.  An echocardiogram and nuclear stress test had been done previously in April 2019 in [place].  These were essentially unremarkable.  Patient was set up for an outpatient catheterization which was done December 07, 2018.  This revealed a patent proximal RCA stent, 95% mid RCA narrowing and 95% distal RCA narrowing.  There were collaterals from the circumflex to the distal RCA.  The circumflex had a proximal stent that was 90% stenosed as well as an OM1 branch that was stenosed.  The LAD was patent with a 50% mid narrowing and a 90% distal narrowing.  LV function was preserved.  His medications were adjusted and he is seen in the office today in follow-up.  Since discharge the patient says he feels no better.  He says the shortness of breath with any exertion that started a couple months ago has not changed at all.  He denies chest pain.  He asked me if he could be admitted to get this straightened out.  I did not feel he was significantly ill enough to warrant admission.   Current Outpatient Medications  Medication Sig Dispense Refill  . amLODipine (NORVASC) 5 MG tablet TAKE 1 TABLET EVERY DAY (Patient taking differently: Take 5 mg by mouth daily. ) 90 tablet 2  . atorvastatin (LIPITOR) 40 MG tablet TAKE 1 AND 1/2  TABLETS EVERY DAY 135 tablet 2  . carvedilol (COREG) 12.5 MG tablet Take 1 tablet (12.5 mg total) by mouth 2 (two) times daily. 180 tablet 3  . cetirizine (ZYRTEC) 10 MG tablet Take 10 mg by mouth as needed for allergies.     Marland Kitchen clopidogrel (PLAVIX) 75 MG tablet Take 1 tablet (75 mg total) by mouth daily with breakfast. 90 tablet 3  . docusate sodium (COLACE) 100 MG capsule Take 100 mg by mouth 2 (two) times daily.    . hydrALAZINE (APRESOLINE) 100 MG tablet TAKE 1 TABLET THREE TIMES DAILY ( DOSE INCREASE ) (Patient taking differently: Take 100 mg by mouth 3 (three) times daily. ) 270 tablet 1  . isosorbide mononitrate (IMDUR) 60 MG 24 hr tablet TAKE 1 TABLET EVERY DAY 90 tablet 3  . losartan (COZAAR) 100 MG tablet Take 100 mg by mouth daily.    . magnesium citrate SOLN Take 0.75 Bottles by mouth daily as needed for severe constipation.    . magnesium hydroxide (MILK OF MAGNESIA) 400 MG/5ML suspension Take 15 mLs by mouth daily as needed for mild constipation.    . Multiple Vitamin (MULTIVITAMIN) tablet Take 1 tablet by mouth daily.    . nitroGLYCERIN (NITROSTAT) 0.4 MG SL tablet DISSOLVE 1 TABLET UNDER THE TONGUE EVERY 5 MINUTES AS NEEDED FOR CHEST PAIN 30 tablet 3  . Omega-3 Fatty Acids (OMEGA-3 FISH OIL) 1200 MG CAPS Take  1,200 mg by mouth 2 (two) times daily.     . pantoprazole (PROTONIX) 20 MG tablet Take 1 tablet (20 mg total) by mouth daily. 90 tablet 3  . Polyethyl Glycol-Propyl Glycol (SYSTANE ULTRA OP) Place 1 drop into both eyes daily as needed (dry eyes).    . Potassium Gluconate 595 MG CAPS Take 595 mg by mouth 2 (two) times daily.     . rivaroxaban (XARELTO) 20 MG TABS tablet Take 1 tablet (20 mg total) by mouth daily with supper. 90 tablet 3  . sodium chloride (OCEAN) 0.65 % SOLN nasal spray Place 1 spray into both nostrils as needed for congestion.    . hydrochlorothiazide (MICROZIDE) 12.5 MG capsule Take 1 capsule (12.5 mg total) by mouth daily. Take 2 Tablets for The first 2 Days  and then take 1 tablet Daily 90 capsule 3   No current facility-administered medications for this visit.     Allergies  Allergen Reactions  . Morphine And Related Swelling    Past Medical History:  Diagnosis Date  . Anginal pain ([place])   . CAD (coronary artery disease)   . Cancer of sigmoid colon ([place])   . DM type 2 (diabetes mellitus, type 2) ([place])   . GERD (gastroesophageal reflux disease)   . Gout   . Hiatal hernia   . Hyperlipemia   . Hypertension   . Incontinence     Social History   Socioeconomic History  . Marital status: Married    Spouse name: Not on file  . Number of children: Not on file  . Years of education: Not on file  . Highest education level: Not on file  Occupational History  . Not on file  Social Needs  . Financial resource strain: Not on file  . Food insecurity:    Worry: Not on file    Inability: Not on file  . Transportation needs:    Medical: Not on file    Non-medical: Not on file  Tobacco Use  . Smoking status: Former Smoker    Packs/day: 1.00    Years: 20.00    Pack years: 20.00    Types: Cigars    Start date: 07/18/1993    Last attempt to quit: 10/02/2017    Years since quitting: 1.2  . Smokeless tobacco: Never Used  Substance and Sexual Activity  . Alcohol use: Yes    Alcohol/week: 0.0 standard drinks    Comment: Socially  . Drug use: Never  . Sexual activity: Not on file  Lifestyle  . Physical activity:    Days per week: Not on file    Minutes per session: Not on file  . Stress: Not on file  Relationships  . Social connections:    Talks on phone: Not on file    Gets together: Not on file    Attends religious service: Not on file    Active member of club or organization: Not on file    Attends meetings of clubs or organizations: Not on file    Relationship status: Not on file  . Intimate partner violence:    Fear of current or ex partner: Not on file    Emotionally abused: Not on file    Physically abused: Not on  file    Forced sexual activity: Not on file  Other Topics Concern  . Not on file  Social History Narrative  . Not on file     Family History  Problem Relation Age of Onset  .  Heart disease Mother   . Colon cancer Brother   . Stomach cancer Sister   . Lupus Sister   . Heart disease Sister   . Diabetes Sister   . Hypertension Sister      Review of Systems: General: negative for chills, fever, night sweats or weight changes.  Cardiovascular: negative for chest pain, orthopnea, palpitations, paroxysmal nocturnal dyspnea or shortness of breath Dermatological: negative for rash Respiratory: negative for cough or wheezing Urologic: negative for hematuria Abdominal: negative for nausea, vomiting, diarrhea, bright red blood per rectum, melena, or hematemesis Neurologic: negative for visual changes, syncope, or dizziness All other systems reviewed and are otherwise negative except as noted above.    Blood pressure 122/68, pulse 67, height 5\' 10"  (1.778 m), weight 220 lb 12.8 oz (100.2 kg), SpO2 97 %.  General appearance: alert, cooperative, no distress and mildly obese Neck: no carotid bruit and no JVD Lungs: clear to auscultation bilaterally Heart: irregularly irregular rhythm Extremities: 1+ edema Skin: warm and dry Neurologic: Grossly normal   ASSESSMENT AND PLAN:   DOE (dyspnea on exertion) He may have some degree of CHF secondary to AF- add diuretic  CAD- S/P PCI S/P multiple PCIs- last 12/07/2018- CFX DES and OM1 POBA. Residual high grade RCA with CFX to RCA collaterals, 50% mLAD, 90% dLAD- medical Rx EF 55%  Atrial fibrillation, persistent Pauses of 3-4 seconds noted on OP Holter July 2019 He was in NSR April 2019  Chronic anticoagulation Xarelto  Essential hypertension Echo from [facility] April 2019- moderate LVH, normal LVF, moderate LAE B/P controlled   PLAN  HCTZ 12.5 mg daily (25 mg QD x 2 initially).  F/U with BMP in a few weeks.  I'll ask Dr  Bronson Ing about considering cardioversion.   Kerin Ransom PA-C 12/26/2018 10:12 AM

## 2018-12-26 ENCOUNTER — Telehealth: Payer: Self-pay | Admitting: Cardiology

## 2018-12-26 NOTE — Telephone Encounter (Signed)
I called Antonio Watkins to discuss cardioversion.  He could confirm he hadn't missed any doses of Xarelto in the past few weeks.  I suggested he take this every day and keep his f/u in a few weeks as scheduled, he can discuss cardioversion further at that time.  Kerin Ransom PA-C 12/26/2018 10:11 AM

## 2019-01-08 ENCOUNTER — Telehealth: Payer: Self-pay | Admitting: Cardiovascular Disease

## 2019-01-08 NOTE — Telephone Encounter (Signed)
Pt was recently admitted to [facility] (records already given to Dr Bronson Ing) says upon d/c carvedilol was stopped BP today 116/59 HR 80 - c/o SOB - denies chest pain/dizziness/swelling - has appt with extender on 3/31 however doesn't want to see anyone other than Dr Ward Chatters - pt aware that we have limited appts with COVID 19 - says he was told he needs to be seen this week

## 2019-01-08 NOTE — Telephone Encounter (Signed)
Vital signs look good.  It is possible he is having shortness of breath due to being out of sinus rhythm.  I would consider a cardioversion after he has demonstrated 3 weeks of uninterrupted anticoagulation.  In his best interest, I would avoid having him come to the office this week to avoid unnecessary COVID19 exposure. We could consider an office appointment perhaps next week or shortly thereafter.

## 2019-01-08 NOTE — Telephone Encounter (Signed)
Said he was in [place] last week and they told him to see Dr Raliegh Ip this week

## 2019-01-09 NOTE — Telephone Encounter (Signed)
Pt now requesting to keep 3/31 appt with Bernerd Pho, [place] - voiced understanding and could not for sure say he has been taking Xarelto as directed

## 2019-01-11 ENCOUNTER — Ambulatory Visit: Payer: Medicare PPO | Admitting: Student

## 2019-01-11 ENCOUNTER — Telehealth: Payer: Self-pay | Admitting: *Deleted

## 2019-01-11 NOTE — Telephone Encounter (Signed)
   Cardiac Questionnaire:    Since your last visit or hospitalization:    1. Have you been having new or worsening chest pain? NO   2. Have you been having new or worsening shortness of breath? Yes 3. Have you been having new or worsening leg swelling, wt gain, or increase in abdominal girth (pants fitting more tightly)? NO   4. Have you had any passing out spells? No     *A YES to any of these questions would result in the appointment being kept. *If all the answers to these questions are NO, we should indicate that given the current situation regarding the worldwide coronarvirus pandemic, at the recommendation of the CDC, we are looking to limit gatherings in our waiting area, and thus will reschedule their appointment beyond four weeks from today.   _____________   TMHDQ-22 Pre-Screening Questions:  . Do you currently have a fever? NO (yes = cancel and refer to pcp for e-visit) . Have you recently travelled on a cruise, internationally, or to [place], [place], [place], [place], [place], or [place], [place] [employer]) ? NO (yes = cancel, stay home, monitor symptoms, and contact pcp or initiate e-visit if symptoms develop) . Have you been in contact with someone that is currently pending confirmation of Covid19 testing or has been confirmed to have the [place] virus?  NO (yes = cancel, stay home, away from tested individual, monitor symptoms, and contact pcp or initiate e-visit if symptoms develop) . Are you currently experiencing fatigue or cough? NO (yes = pt should be prepared to have a mask placed at the time of their visit).   Spoke with pt who would like to keep appt with Bernerd Pho, [place].

## 2019-01-15 ENCOUNTER — Telehealth: Payer: Self-pay | Admitting: Cardiovascular Disease

## 2019-01-15 NOTE — Telephone Encounter (Signed)
Mrs. Holtmeyer called stating that Antonio Watkins wants to cancel appointment and re-schedule. States that he is weak and does not want to come out due to COVID-19 .

## 2019-01-16 ENCOUNTER — Ambulatory Visit: Payer: Medicare PPO | Admitting: Student

## 2019-01-22 ENCOUNTER — Telehealth: Payer: Self-pay | Admitting: Cardiovascular Disease

## 2019-01-22 ENCOUNTER — Encounter: Payer: Self-pay | Admitting: *Deleted

## 2019-01-22 NOTE — Telephone Encounter (Signed)
Pt agreeable - COVID 19 screening questions no high risk travel - no fever/coughing in last 2 weeks - has not been in contact with anyone sick in the last 2 weeks. Will request records from [facility]

## 2019-01-22 NOTE — Telephone Encounter (Signed)
Pt was at [facility] 2 weeks ago for 2 days for "irregular heart beat" and last night c/o chest pain with SOB lasting about 30 mins and have wife call EMS - says EMS told him vitals were normal and was up to him if he wanted to go to ED which pt chose not to with current pandemic. Denies any symptoms at this time, says he feels better than he did last night. Wanted to know if he should be seen, has f/u scheduled in May

## 2019-01-22 NOTE — Telephone Encounter (Signed)
Patient had EMS to come to his house last night due to chest pains and breathing really hard. Michela Pitcher they worked on him for [employer] and he opted not to go to the hospital. He is requesting to be seen in office by Dr Bronson Ing

## 2019-01-22 NOTE — Telephone Encounter (Signed)
Unfortunately, I am not in the office this week but agree he needs an in office evaluation. I have spoken to Dr. Harl Bowie who has kindly agreed to see him at 920 on 01/23/2019. Please encourage the patient to go to this appointment.

## 2019-01-23 ENCOUNTER — Other Ambulatory Visit: Payer: Self-pay

## 2019-01-23 ENCOUNTER — Encounter: Payer: Self-pay | Admitting: Cardiology

## 2019-01-23 ENCOUNTER — Ambulatory Visit (INDEPENDENT_AMBULATORY_CARE_PROVIDER_SITE_OTHER): Payer: Medicare PPO | Admitting: Cardiology

## 2019-01-23 VITALS — BP 114/67 | HR 79 | Temp 98.3°F | Ht 70.0 in | Wt 214.0 lb

## 2019-01-23 DIAGNOSIS — R0789 Other chest pain: Secondary | ICD-10-CM | POA: Diagnosis not present

## 2019-01-23 DIAGNOSIS — R0609 Other forms of dyspnea: Secondary | ICD-10-CM | POA: Diagnosis not present

## 2019-01-23 DIAGNOSIS — D649 Anemia, unspecified: Secondary | ICD-10-CM

## 2019-01-23 DIAGNOSIS — I2510 Atherosclerotic heart disease of native coronary artery without angina pectoris: Secondary | ICD-10-CM

## 2019-01-23 DIAGNOSIS — I4891 Unspecified atrial fibrillation: Secondary | ICD-10-CM

## 2019-01-23 MED ORDER — ALBUTEROL SULFATE HFA 108 (90 BASE) MCG/ACT IN AERS: 1.0000 | INHALATION_SPRAY | Freq: Four times a day (QID) | RESPIRATORY_TRACT | 2 refills | Status: AC | PRN

## 2019-01-23 NOTE — Patient Instructions (Addendum)
Medication Instructions: Take Albuterol inhaler as directed, 1 puff every 6 hours as needed for shortness of breath  Call back when you get home to discuss the names of medications you are taking  Take hydrochlorothiazide 12.5 mg every OTHER day  Labwork: CBC on Friday  01/26/19  Procedures/Testing: None today  Follow-Up: We will let you know after we have your correct medication list TELEPHONE apt on   Any Additional Special Instructions Will Be Listed Below (If Applicable).  Call Dr.Favero and make an appointment to discuss your shortness of breath  We have referred you to the GI doctor for your anemia, they will call you with an apt    If you need a refill on your cardiac medications before your next appointment, please call your pharmacy.

## 2019-01-23 NOTE — Progress Notes (Signed)
Clinical Summary Antonio Watkins is a 85 y.o.male seen today for follow up of the following medical problems.    1. CAD - history of prior stenting to RCA and LCX in 2000 and 2002 - nuclear stress 01/2018 [facility] without clear ishemia 01/2018 echo [facility]: LVEF 55-60%, mod LVH, mod LAE, mild to mod MR - has had recent issues with exertional dyspnea - from 11/2018 note Dr Bronson Ing had arranged for cath but patient did not have transportation - 11/2018 RHC/LHC: mid LAD 50%, distal LAD 90%, LCX ostial 80%, OM1 80%, RCA prox 25%, mid RCA 90%, distal RCA 95%. Left to right collaterals fills the distal RCA system. Received DES to ostial  LCX, PTCA to OM1. LAD disease is at the apex, RCA has collaterals, felt would not benefit from bypass.  RHC: mean PA 33, PCWP 16, CI 3.2  - started on HCTZ at 12/25/18 visit, had 1+ LE edema at that visit  - admission [facility] 12/2018 with dizzienss and SOB: Na 127, BNP 708,  - CXR no acute process  - tightness left sided at rest, not positional. 5/10 in severity. +SOB. Isolated episode.  No other associated symptoms, lasted a few minutes. +fatigue - went to [facility] ER yesterday. EKG afib without acute ischemic changes. Trop neg x 2. Hgb 8 - ongoing fatigue for a few days.    2. Afib - 04/2018 event monitor: rate controlled afib, PVCs and runs of wide complex tach afib with aberrancy, multiple pauses 3 to 3.4 sec - looks like afib diagnosed 04/2018. Has undergone rate control strategy  - admitted 12/2018 with reported symptomatic bradycardia, rates 40s to 50s. H&P reports symptoms started after he was started on HCTZ. Also had abdominal and muscle cramping.  - his coreg was stopped, had been on 3.125mg  bid.    - no recent palpitations.  - he is unsure what anticoag/antiplatelet agent he is currently taking    3. Black stools - reported previously from prior visit - cbc showed 12/2018 Hgb 9.5, Plt 215, down from 11.3 early Feb - 01/01/2019 [facility] Hgb  10.2 - 01/2019 Hgb [facility] 8.1  - no recent black stools, no blood in stools - he did not see GI as previously recommended by Dr Bronson Ing    4. Hyponatremia - admission 12/2018 [facility]. Na 123, Cr 0.89, BUN 10 - given IVFs  - dizziness has improved. He is not able to describe the symptoms.    5. [place] is stable over the last several months - no improvement after cath and coronary interventions - no cough, no wheezing - 40 year history of smoking.  - does not recall PFTs  Past Medical History:  Diagnosis Date  . Anginal pain ([place])   . CAD (coronary artery disease)   . Cancer of sigmoid colon ([place])   . DM type 2 (diabetes mellitus, type 2) ([place])   . GERD (gastroesophageal reflux disease)   . Gout   . Hiatal hernia   . Hyperlipemia   . Hypertension   . Incontinence      Allergies  Allergen Reactions  . Morphine And Related Swelling     Current Outpatient Medications  Medication Sig Dispense Refill  . amLODipine (NORVASC) 5 MG tablet TAKE 1 TABLET EVERY DAY (Patient taking differently: Take 5 mg by mouth daily. ) 90 tablet 2  . atorvastatin (LIPITOR) 40 MG tablet TAKE 1 AND 1/2 TABLETS EVERY DAY 135 tablet 2  . carvedilol (COREG)  12.5 MG tablet Take 1 tablet (12.5 mg total) by mouth 2 (two) times daily. 180 tablet 3  . cetirizine (ZYRTEC) 10 MG tablet Take 10 mg by mouth as needed for allergies.     Marland Kitchen clopidogrel (PLAVIX) 75 MG tablet Take 1 tablet (75 mg total) by mouth daily with breakfast. 90 tablet 3  . docusate sodium (COLACE) 100 MG capsule Take 100 mg by mouth 2 (two) times daily.    . hydrALAZINE (APRESOLINE) 100 MG tablet TAKE 1 TABLET THREE TIMES DAILY ( DOSE INCREASE ) (Patient taking differently: Take 100 mg by mouth 3 (three) times daily. ) 270 tablet 1  . hydrochlorothiazide (MICROZIDE) 12.5 MG capsule Take 1 capsule (12.5 mg total) by mouth daily. Take 2 Tablets for The first 2 Days and then take 1 tablet Daily 90 capsule 3  . isosorbide  mononitrate (IMDUR) 60 MG 24 hr tablet TAKE 1 TABLET EVERY DAY 90 tablet 3  . losartan (COZAAR) 100 MG tablet Take 100 mg by mouth daily.    . magnesium citrate SOLN Take 0.75 Bottles by mouth daily as needed for severe constipation.    . magnesium hydroxide (MILK OF MAGNESIA) 400 MG/5ML suspension Take 15 mLs by mouth daily as needed for mild constipation.    . Multiple Vitamin (MULTIVITAMIN) tablet Take 1 tablet by mouth daily.    . nitroGLYCERIN (NITROSTAT) 0.4 MG SL tablet DISSOLVE 1 TABLET UNDER THE TONGUE EVERY 5 MINUTES AS NEEDED FOR CHEST PAIN 30 tablet 3  . Omega-3 Fatty Acids (OMEGA-3 FISH OIL) 1200 MG CAPS Take 1,200 mg by mouth 2 (two) times daily.     . pantoprazole (PROTONIX) 20 MG tablet Take 1 tablet (20 mg total) by mouth daily. 90 tablet 3  . Polyethyl Glycol-Propyl Glycol (SYSTANE ULTRA OP) Place 1 drop into both eyes daily as needed (dry eyes).    . Potassium Gluconate 595 MG CAPS Take 595 mg by mouth 2 (two) times daily.     . rivaroxaban (XARELTO) 20 MG TABS tablet Take 1 tablet (20 mg total) by mouth daily with supper. 90 tablet 3  . sodium chloride (OCEAN) 0.65 % SOLN nasal spray Place 1 spray into both nostrils as needed for congestion.     No current facility-administered medications for this visit.      Past Surgical History:  Procedure Laterality Date  . cataracts    . CHOLECYSTECTOMY    . CORONARY STENT INTERVENTION  12/06/2018  . CORONARY STENT INTERVENTION N/A 12/06/2018   Procedure: CORONARY STENT INTERVENTION;  Surgeon: Jettie Booze, MD;  Location: [place] [facility];  Service: Cardiovascular;  Laterality: N/A;  . MINOR CARPAL TUNNEL    . REPLACEMENT TOTAL KNEE    . RIGHT/LEFT HEART CATH AND CORONARY ANGIOGRAPHY N/A 12/06/2018   Procedure: RIGHT/LEFT HEART CATH AND CORONARY ANGIOGRAPHY;  Surgeon: Jettie Booze, MD;  Location: [place] [facility];  Service: Cardiovascular;  Laterality: N/A;     Allergies  Allergen Reactions  . Morphine And  Related Swelling      Family History  Problem Relation Age of Onset  . Heart disease Mother   . Colon cancer Brother   . Stomach cancer Sister   . Lupus Sister   . Heart disease Sister   . Diabetes Sister   . Hypertension Sister      Social History Mr. Halterman reports that he quit smoking about 15 months ago. His smoking use included cigars. He started smoking about 25 years ago. He  has a 20.00 pack-year smoking history. He has never used smokeless tobacco. Mr. Vandiver reports current alcohol use.   Review of Systems CONSTITUTIONAL: No weight loss, fever, chills, weakness or fatigue.  HEENT: Eyes: No visual loss, blurred vision, double vision or yellow sclerae.No hearing loss, sneezing, congestion, runny nose or sore throat.  SKIN: No rash or itching.  CARDIOVASCULAR: per hpi RESPIRATORY: per hpi GASTROINTESTINAL: No anorexia, nausea, vomiting or diarrhea. No abdominal pain or blood.  GENITOURINARY: No burning on urination, no polyuria NEUROLOGICAL: No headache, dizziness, syncope, paralysis, ataxia, numbness or tingling in the extremities. No change in bowel or bladder control.  MUSCULOSKELETAL: No muscle, back pain, joint pain or stiffness.  LYMPHATICS: No enlarged nodes. No history of splenectomy.  PSYCHIATRIC: No history of depression or anxiety.  ENDOCRINOLOGIC: No reports of sweating, cold or heat intolerance. No polyuria or polydipsia.  Marland Kitchen   Physical Examination Today's Vitals   01/23/19 0914  BP: 114/67  Pulse: 79  Temp: 98.3 F (36.8 C)  SpO2: 100%  Weight: 214 lb (97.1 kg)  Height: 5\' 10"  (1.778 m)   Body mass index is 30.71 kg/m.  Gen: resting comfortably, no acute distress HEENT: no scleral icterus, pupils equal round and reactive, no palptable cervical adenopathy,  CV: irreg, no m/r/g, no jvd Resp: Clear to auscultation bilaterally GI: abdomen is soft, non-tender, non-distended, normal bowel sounds, no hepatosplenomegaly MSK: extremities are  warm, no edema.  Skin: warm, no rash Neuro:  no focal deficits Psych: appropriate affect    Assessment and Plan  1. CAD/Chest pain - isolated episode of chest pain yesterday, negative workup at [facility] including EKG and enzymes. No recurrence. Ongoing fatigue, chronic DOE unchanged.  - recent PCI as reported above, no change in his ongoing DOE - he is unsure of what medicines he is taking at home, he will call us today with his pill bottles. As of now unclear what combination of ASA/plavix/xarelto he may be taking. Should be only on plavix and xarelto  2. Afib - new diagnosis 04/2018, persistent afib - off coreg due to bradycardia during 12/2018 admission at [facility] - he is unsure of what anticoag/antiplatelet regimen he is taking at home - EKG today shows afib rate 70s - unclear if afib is related to his DOE. With his ongoing anemia would be hesistant for cardioversion given potential need to hold anticoag  3. Anemia - Hgb up and down over the last several months. He preivously had dark stools but none recently - Hgb 8.1 yesterday in [place] ER. He is unsure of all his meds and what anticoag/antiplatelet agent he is taking, unclear if he may be on triple therapy - refer again to GI, repeat cbc Friday. He is asked to contact his pcp as well - if further drop in Hgb by Friday's cbc may require hospital evaluation  - if progressing anemia consider stopping xarelto, continuing plavix due to recent stent.   4. DOE - unclear etiology. No improvement after recent PCI. [place] numbers were not bad, with trial of increased diuresis symptoms did not improve and he became hyponatremic with muscle cramps. He is unsure if he is still taking HCTZ or not - anemia could be playing some role, refer to GI, asked to discus with pcp - would consider PFTs given long smoking history, but due to COVID-19 risks will emperically try albuterol prn. Would benefit from PFTs at some point.  - if no other etiologies for  DOE detected may need to consider  trial of cardioversion once anemia issue is resolved, unclear if afib is cause - 100% on RA today in clinic. CXR yesterday [facility] ER no acute process   5. Hyponatermia - improved, Na from [place] labs yesterday show Na 133 - he is unsure if he is still taking HCTZ or not. He will call with his pill bottles. His weight is down 6 lbs from 12/2018 visit.  - would change HCTZ to 12.5mg  every other day   F/u Dr Bronson Ing 3 weeks   Arnoldo Lenis, M.D.

## 2019-01-25 ENCOUNTER — Encounter: Payer: Self-pay | Admitting: Internal Medicine

## 2019-02-15 ENCOUNTER — Encounter: Payer: Self-pay | Admitting: Cardiovascular Disease

## 2019-02-15 ENCOUNTER — Telehealth (INDEPENDENT_AMBULATORY_CARE_PROVIDER_SITE_OTHER): Payer: Medicare PPO | Admitting: Cardiovascular Disease

## 2019-02-15 VITALS — BP 141/61 | HR 87 | Ht 70.0 in | Wt 210.0 lb

## 2019-02-15 DIAGNOSIS — E785 Hyperlipidemia, unspecified: Secondary | ICD-10-CM

## 2019-02-15 DIAGNOSIS — D649 Anemia, unspecified: Secondary | ICD-10-CM

## 2019-02-15 DIAGNOSIS — I2510 Atherosclerotic heart disease of native coronary artery without angina pectoris: Secondary | ICD-10-CM | POA: Diagnosis not present

## 2019-02-15 DIAGNOSIS — I25118 Atherosclerotic heart disease of native coronary artery with other forms of angina pectoris: Secondary | ICD-10-CM

## 2019-02-15 DIAGNOSIS — R0609 Other forms of dyspnea: Secondary | ICD-10-CM | POA: Diagnosis not present

## 2019-02-15 DIAGNOSIS — I10 Essential (primary) hypertension: Secondary | ICD-10-CM

## 2019-02-15 DIAGNOSIS — Z7901 Long term (current) use of anticoagulants: Secondary | ICD-10-CM

## 2019-02-15 DIAGNOSIS — I4819 Other persistent atrial fibrillation: Secondary | ICD-10-CM

## 2019-02-15 NOTE — Patient Instructions (Addendum)
Medication Instructions:  Continue all current medications.  Labwork:  Need to get CBC done as soon as possible.    Advised patient to have lab done at [facility] will fax order now.    Continue all other medications.    Testing/Procedures: none  Follow-Up: 1 month   Any Other Special Instructions Will Be Listed Below (If Applicable).  [name] has scheduled you an appointment for 04/19/2019.  I have called their office to request an earlier appointment if possible.  Their office will reach out to patient to reschedule if they can do so.  Call placed to patient and he is in agreement to coming to [facility] for GI visit.  This AVS discussed with patient today & patient verbalized understanding.    If you need a refill on your cardiac medications before your next appointment, please call your pharmacy.

## 2019-02-15 NOTE — Progress Notes (Signed)
Virtual Visit via Telephone Note   This visit type was conducted due to national recommendations for restrictions regarding the COVID-19 Pandemic (e.g. social distancing) in an effort to limit this patient's exposure and mitigate transmission in our community.  Due to his co-morbid illnesses, this patient is at least at moderate risk for complications without adequate follow up.  This format is felt to be most appropriate for this patient at this time.  The patient did not have access to video technology/had technical difficulties with video requiring transitioning to audio format only (telephone).  All issues noted in this document were discussed and addressed.  No physical exam could be performed with this format.  Please refer to the patient's chart for his  consent to telehealth for [facility].   Evaluation Performed:  Follow-up visit  Date:  02/15/2019   ID:  Antonio Watkins, DOB 05-01-1930, MRN [id number]  Patient Location: Home Provider Location: Home  PCP:  Jacqualine Code, DO  Cardiologist:  Kate Sable, MD  Electrophysiologist:  None   Chief Complaint:  SOB  History of Present Illness:    Antonio Watkins is a 85 y.o. male with CAD, anemia, and atrial fibrillation.  He underwent DES to the ostial to proximal left circumflex and balloon angioplasty to the OM1 (residual 30% stenosis) on 12/06/18. It was recommended he stay on Plavix a minimum of 30 days but could consider stopping before 6 months.  He saw Dr. Harl Bowie on 01/23/19. He ordered a CBC and repeat GI referral.  He continues to have exertional dyspnea when walking from one room to the next. He denies chest pain. Hgb 8.1 on 01/22/19 in [place]. He did not get a repeat CBC. He hasn't been called by GI either.  He has a 40 pk yr h/o smoking cigarettes and quit at age 62. He quit smoking cigars as well about 2 yrs ago. He has not had PFT's.   He said albuterol hasn't helped.   The patient does not have  symptoms concerning for COVID-19 infection (fever, chills, cough, or new shortness of breath).    Past Medical History:  Diagnosis Date  . Anginal pain ([place])   . CAD (coronary artery disease)   . Cancer of sigmoid colon ([place])   . DM type 2 (diabetes mellitus, type 2) ([place])   . GERD (gastroesophageal reflux disease)   . Gout   . Hiatal hernia   . Hyperlipemia   . Hypertension   . Incontinence    Past Surgical History:  Procedure Laterality Date  . cataracts    . CHOLECYSTECTOMY    . CORONARY STENT INTERVENTION  12/06/2018  . CORONARY STENT INTERVENTION N/A 12/06/2018   Procedure: CORONARY STENT INTERVENTION;  Surgeon: Jettie Booze, MD;  Location: [facility];  Service: Cardiovascular;  Laterality: N/A;  . MINOR CARPAL TUNNEL    . REPLACEMENT TOTAL KNEE    . RIGHT/LEFT HEART CATH AND CORONARY ANGIOGRAPHY N/A 12/06/2018   Procedure: RIGHT/LEFT HEART CATH AND CORONARY ANGIOGRAPHY;  Surgeon: Jettie Booze, MD;  Location: [facility];  Service: Cardiovascular;  Laterality: N/A;     Current Meds  Medication Sig  . albuterol (PROVENTIL HFA;VENTOLIN HFA) 108 (90 Base) MCG/ACT inhaler Inhale 1 puff into the lungs every 6 (six) hours as needed for wheezing or shortness of breath.  Marland Kitchen amLODipine (NORVASC) 5 MG tablet TAKE 1 TABLET EVERY DAY (Patient taking differently: Take 5 mg by mouth daily. )  . atorvastatin (LIPITOR) 40  MG tablet TAKE 1 AND 1/2 TABLETS EVERY DAY  . clopidogrel (PLAVIX) 75 MG tablet Take 1 tablet (75 mg total) by mouth daily with breakfast.  . docusate sodium (COLACE) 100 MG capsule Take 100 mg by mouth 2 (two) times daily.  . hydrALAZINE (APRESOLINE) 100 MG tablet TAKE 1 TABLET THREE TIMES DAILY ( DOSE INCREASE ) (Patient taking differently: Take 100 mg by mouth 3 (three) times daily. )  . hydrochlorothiazide (HYDRODIURIL) 12.5 MG tablet Take 12.5 mg by mouth every other day.  . isosorbide mononitrate (IMDUR) 60 MG 24 hr tablet TAKE 1 TABLET  EVERY DAY  . losartan (COZAAR) 100 MG tablet Take 100 mg by mouth daily.  . magnesium hydroxide (MILK OF MAGNESIA) 400 MG/5ML suspension Take 15 mLs by mouth daily as needed for mild constipation.  . Multiple Vitamin (MULTIVITAMIN) tablet Take 1 tablet by mouth daily.  . nitroGLYCERIN (NITROSTAT) 0.4 MG SL tablet DISSOLVE 1 TABLET UNDER THE TONGUE EVERY 5 MINUTES AS NEEDED FOR CHEST PAIN  . Omega-3 Fatty Acids (OMEGA-3 FISH OIL) 1200 MG CAPS Take 1,200 mg by mouth 2 (two) times daily.   . pantoprazole (PROTONIX) 20 MG tablet Take 1 tablet (20 mg total) by mouth daily.  Vladimir Faster Glycol-Propyl Glycol (SYSTANE ULTRA OP) Place 1 drop into both eyes daily as needed (dry eyes).  . Potassium Gluconate 595 MG CAPS Take 595 mg by mouth 2 (two) times daily.   . rivaroxaban (XARELTO) 20 MG TABS tablet Take 1 tablet (20 mg total) by mouth daily with supper.     Allergies:   Morphine and related   Social History   Tobacco Use  . Smoking status: Former Smoker    Packs/day: 1.00    Years: 20.00    Pack years: 20.00    Types: Cigars    Start date: 07/18/1993    Last attempt to quit: 10/02/2017    Years since quitting: 1.3  . Smokeless tobacco: Never Used  Substance Use Topics  . Alcohol use: Yes    Alcohol/week: 0.0 standard drinks    Comment: Socially  . Drug use: Never     Family Hx: The patient's family history includes Colon cancer in his brother; Diabetes in his sister; Heart disease in his mother and sister; Hypertension in his sister; Lupus in his sister; Stomach cancer in his sister.  ROS:   Please see the history of present illness.     All other systems reviewed and are negative.   Prior CV studies:   The following studies were reviewed today:  Coronary angiography (12/06/18):   Mid RCA lesion is 90% stenosed.  Dist RCA lesion is 95% stenosed. Post Atrio lesion is 95% stenosed. THere are left to right collaterals filling the distal RCA system.  Ost Cx to Prox Cx lesion  is 80% stenosed.  A drug-eluting stent was successfully placed using a STENT SYNERGY DES 2.75X12, dilated to 3.25 mm.  Post intervention, there is a 0% residual stenosis.  Ost 1st Mrg lesion is 80% stenosed.  Scoring balloon angioplasty was performed using a BALLOON WOLVERINE 2.50X10.  Post intervention, there is a 30% residual stenosis.  Mid LAD lesion is 50% stenosed.  Dist LAD lesion is 90% stenosed.  The left ventricular systolic function is normal.  The left ventricular ejection fraction is 50-55% by visual estimate.  LV end diastolic pressure is normal.  There is no aortic valve stenosis.  Hemodynamic findings consistent with mild pulmonary hypertension.  Ao 90%, PA saturation 63%,  mean PA 33 mmHg, mean wedge pressure 16 mmHg, Cardiac output 6.9 L/min, cardiac index 3.2   Patient with multivessel disease.  However, his LAD disease is at the apex.  He would not benefit from bypass surgery.  The RCA is a long diffuse lesion with collaterals.  I elected to intervene upon the circumflex since that seemed to supply the most myocardium at risk.  Synergy stent was placed.  If he has any bleeding issues, first would stop aspirin before the 1 month recommendation.  After that, could consider stopping Plavix before 6 months, but he must complete at least 30 days.  Restart Xarelto tomorrow.  Continue aggressive secondary prevention.  Labs/Other Tests and Data Reviewed:    EKG:  No ECG reviewed.  Recent Labs: 12/07/2018: Hemoglobin 11.2; Platelets 175 12/25/2018: BUN 16; Creatinine, Ser 1.20; Potassium 4.5; Sodium 130   Recent Lipid Panel No results found for: CHOL, TRIG, HDL, CHOLHDL, LDLCALC, LDLDIRECT  Wt Readings from Last 3 Encounters:  02/15/19 210 lb (95.3 kg)  01/23/19 214 lb (97.1 kg)  12/25/18 220 lb 12.8 oz (100.2 kg)     Objective:    Vital Signs:  BP (!) 141/61   Pulse 87   Ht 5\' 10"  (1.778 m)   Wt 210 lb (95.3 kg)   BMI 30.13 kg/m    VITAL SIGNS:   reviewed  ASSESSMENT & PLAN:    1. Exertional dyspnea: Unclear etiology but anemia could be playing a role along with undiagnosed COPD. He has not had any relief from albuterol. He did not get CBC. I have instructed him to have this completed. No improvement in symptoms after PCI.  RHC numbers were not bad. Had a trial of increased diuresis but symptoms did not improve and he became hyponatremic with muscle cramps. I will make another referral to GI. If no other etiologies for DOE detected may need to consider trial of cardioversion once anemia issue is resolved, albiet unclear if afib is the cause.  2. CAD: He underwent DES to the ostial to proximal left circumflex and balloon angioplasty to the OM1 (residual 30% stenosis) on 12/06/18. It was recommended he stay on Plavix a minimum of 30 days but could consider stopping before 6 months. He remains on Plavix for now. Continue atorvastatin and Imdur. No ASA as he is on Xarelto.  3. Anemia: He did not get CBC. I have instructed him to have this completed. No improvement in symptoms after PCI.  I will make another referral to GI. He has a h/o black stools.  4. Persistent atrial fibrillation: Not on beta blocker therapy due to bradycardia while hospitalized at [facility] in 12/2018. Continue Xarelto. With his ongoing anemia, I would be hesistant to proceed with cardioversion given potential need to hold anticoagulation for anemia. It is also unclear if this is what symptoms are due to.  5. Hyperlipidemia: Continue Lipitor.  6. HTN: Continue to monitor BP.    COVID-19 Education: The signs and symptoms of COVID-19 were discussed with the patient and how to seek care for testing (follow up with PCP or arrange E-visit).  The importance of social distancing was discussed today.  Time:   Today, I have spent 25 minutes with the patient with telehealth technology discussing the above problems.     Medication Adjustments/Labs and Tests Ordered: Current  medicines are reviewed at length with the patient today.  Concerns regarding medicines are outlined above.   Tests Ordered: No orders of the defined types were placed  in this encounter.   Medication Changes: No orders of the defined types were placed in this encounter.   Disposition:  Follow up in 6 week(s)  Signed, Kate Sable, MD  02/15/2019 5:21 PM    [place]

## 2019-02-20 ENCOUNTER — Other Ambulatory Visit: Payer: Self-pay

## 2019-02-20 ENCOUNTER — Ambulatory Visit: Payer: Medicare PPO | Admitting: Nurse Practitioner

## 2019-02-21 ENCOUNTER — Telehealth: Payer: Self-pay | Admitting: *Deleted

## 2019-02-21 NOTE — Telephone Encounter (Signed)
Notes recorded by Laurine Blazer, LPN on 05/20/1516 at 61:60 AM EDT Patient notified. Copy to pmd. GI appointment is scheduled. Per wife, he did have GI scheduled yesterday. Patient states he wasn't feeling well & rescheduled. Strongly encouraged wife to call GI office back to see if they can work on getting an earlier appointment back. Really did not want patient to wait till 04/12/2019. He verbalized understanding. ------  Notes recorded by Herminio Commons, MD on 02/19/2019 at 4:41 PM EDT Remains significantly anemic which is likely cause of symptoms. Needs to see GI very soon.

## 2019-02-27 ENCOUNTER — Ambulatory Visit (INDEPENDENT_AMBULATORY_CARE_PROVIDER_SITE_OTHER): Payer: Medicare PPO | Admitting: Nurse Practitioner

## 2019-02-27 ENCOUNTER — Other Ambulatory Visit: Payer: Self-pay

## 2019-02-27 ENCOUNTER — Telehealth: Payer: Self-pay

## 2019-02-27 ENCOUNTER — Encounter: Payer: Self-pay | Admitting: Nurse Practitioner

## 2019-02-27 VITALS — BP 130/69 | HR 78 | Temp 96.9°F | Ht 70.0 in | Wt 214.8 lb

## 2019-02-27 DIAGNOSIS — K921 Melena: Secondary | ICD-10-CM | POA: Insufficient documentation

## 2019-02-27 DIAGNOSIS — R0609 Other forms of dyspnea: Secondary | ICD-10-CM | POA: Diagnosis not present

## 2019-02-27 DIAGNOSIS — D649 Anemia, unspecified: Secondary | ICD-10-CM

## 2019-02-27 NOTE — Assessment & Plan Note (Signed)
The patient has been having significant dyspnea on exertion despite recent PCI and no improvement.  He has been noted to be significantly anemic, although he has had some drift improvement with his last labs.  He got as low as 8.0 and most recently his CBC about a week ago was 8.4.  Anemia is likely a significant component of his dyspnea.  We will further evaluate this as per above.

## 2019-02-27 NOTE — Assessment & Plan Note (Signed)
Symptomatic anemia with a normal hemoglobin in the 11-12 range, got down to a low of 8.0 and now mildly improved to 8.4.  He does have dyspnea on exertion.  He underwent a PCI in February and has not had any improvement since then.  He also notes "very dark stools" recently, although none in the past 1 to 2 weeks.  Denies ongoing bleeding.  He is on Xarelto.  I have given him ER precautions for any bleeding to proceed the emergency room or call our office.  He has had a colonoscopy within the past year due to a history of colon cancer and this was found to be normal.  We will request his records and any associated past.  I will recheck his CBC and ferritin in 1 week.  We will plan for an upper endoscopy to further evaluate significant/symptomatic anemia in the setting of likely melena.  We will get clearance from cardiology to hold his Xarelto for 2 days prior to ensure they are okay with proceeding given his recent cardiac cath and PCI.  Follow-up in 2 months.

## 2019-02-27 NOTE — Patient Instructions (Signed)
Your health issues we discussed today were:   Anemia and black stools: 1. As we discussed, black stools could be blood coming from your upper GI tract. 2. I know you are on a blood thinner by cardiology and recently had a cardiac cath and a stent in February 3. I want to recheck your labs in 1 week 4. I want to have an upper endoscopy completed to evaluate for possible sources of bleeding 5. We will get clearance from your cardiologist to proceed with this and to hold your blood thinner for 2 days prior 6. We will communicate instructions on holding your blood thinner to use 7. As we discussed, if you see any bleeding whatsoever including red blood in your stools or recurrent black/dark stools then proceed to the emergency department. 8. Call us if you have any worsening symptoms otherwise  Overall I recommend:  1. Continue your other medications 2. Call us if you have any questions or concerns 3. Follow-up in 2 months.   Because of recent events of COVID-19 ("Coronavirus"), follow CDC recommendations:  1. Wash your hand frequently 2. Avoid touching your face 3. Stay away from people who are sick 4. If you have symptoms such as fever, cough, shortness of breath then call your healthcare provider for further guidance 5. If you are sick, STAY AT HOME unless otherwise directed by your healthcare provider. 6. Follow directions from state and national officials regarding staying safe   At [facility] we value your feedback. You may receive a survey about your visit today. Please share your experience as we strive to create trusting relationships with our patients to provide genuine, compassionate, quality care.  We appreciate your understanding and patience as we review any laboratory studies, imaging, and other diagnostic tests that are ordered as we care for you. Our office policy is 5 business days for review of these results, and any emergent or urgent results are  addressed in a timely manner for your best interest. If you do not hear from our office in 1 week, please contact us.   We also encourage the use of MyChart, which contains your medical information for your review as well. If you are not enrolled in this feature, an access code is on this after visit summary for your convenience. Thank you for allowing [place] to be involved in your care.  It was great to see you today!  I hope you have a great day!!

## 2019-02-27 NOTE — Assessment & Plan Note (Signed)
Recent PCI, history of A. fib on Xarelto.  His anemia has declined from 11-8.0 in the past several months.  His last check showed a slight improvement to 8.4.  He describes dark/black stools within the past month or 2.  He has not had any in a couple weeks, per his best guess.  Does not appear he is having ongoing melena which is why his anemia is likely mildly improving.  He does have a history of colon cancer but his most recent colonoscopy was within the last year at [facility], and we will request these records.  I will recheck a CBC and ferritin in 1 week.  We will plan for admission to further evaluate melena and significant anemia.  We will discuss with cardiology about clearance for holding Xarelto and proceeding despite recent PCI in February 2020.  Return for follow-up in 2 months.  He has been given ER precautions for any noted hematochezia or melena to proceed to the emergency department or call our office.

## 2019-02-27 NOTE — Progress Notes (Addendum)
REVIEWED-NO ADDITIONAL RECOMMENDATIONS.  Primary Care Physician:  Jacqualine Code, DO Primary Gastroenterologist:  Dr. Oneida Alar  Chief Complaint  Patient presents with  . Anemia    SOB,Low hemoglobin    HPI:   Antonio Watkins is a 85 y.o. male who presents on referral from cardiology for significant anemia and shortness of breath.  It appears the patient was initially referred to GI at [facility] gastroenterology in [place], [place] for melena and history of colon cancer.  However, the patient was at some point lost in the process.  Subsequently the patient was referred to our office with complaints of anemia.  This patient has been seen by cardiology, visit on 12/25/2018 noted status post multiple PCI's last one which was 12/07/2018 with residual high-grade RCA with circumflex RCA collaterals and EF of 55%.  The patient was complaining of significant dyspnea on exertion.  He is anticoagulated with Xarelto.  The patient was subsequently seen for 05/07/2019 by Dr. Harl Bowie.  In addition to his cardiac issues discussed at that time he noted black stools with a CBC in March 2020 with a hemoglobin of 9.5 which is down from 11.3 in February.  In April 2020 at [facility] his hemoglobin was 8.1.  Denied recent black stools or blood in stools.  Did not previously see GI.  Follow-up telemedicine on 02/15/2019 indicates patient did not get a repeat CBC and has not been called by GI in [place], [place].  He remains short of breath.  There is an unclear etiology around his exertional dyspnea but anemia can be playing a role.  He was instructed to have CBC completed.  Increase diuresis did not help.  Another referral was made to GI (at this point he was referred to our office).  The patient was initially scheduled with our office last week.  However, he canceled  CBC was completed most recently 02/16/2019 which showed very mild improvement in hemoglobin to 8.4.  They recommended they call GI back to reschedule  your appointment.  Today he states he's doing ok overall. Still with dyspnea. Had colon cancer in 1990-ish when he was living in [place] at [facility] (which he states isn't there anymore). Had a partial colectomy; no chemotherapy or radiation. Last colonoscopy was last year at [facility] in [place], [place]. He states he was told he has no polyps, everything looked good. Has had an EGD, but "it's been a number of years." Denies abdominal pain, N/V, hematochezia. Has seen melena stools (dark/black) within the past couple months, unsure/can't remember when he last saw dark/black stools. Has a bowel movement about 2-3 times a week with Milk of Mag (which has been his normal since partial colectomy). Denies fever, chills, unintentional weight loss. Denies GERD symptoms. Denies URI or flu-like symptoms. No loss of senses of taste/smell. Denies chest pain, dyspnea, dizziness, lightheadedness, syncope, near syncope. Denies any other upper or lower GI symptoms.  Past Medical History:  Diagnosis Date  . Anginal pain ([place])   . CAD (coronary artery disease)   . Cancer of sigmoid colon ([place])    approx 1990  . DM type 2 (diabetes mellitus, type 2) ([place])   . GERD (gastroesophageal reflux disease)   . Gout   . Hiatal hernia   . Hyperlipemia   . Hypertension   . Incontinence     Past Surgical History:  Procedure Laterality Date  . cataracts    . CHOLECYSTECTOMY    . CORONARY STENT INTERVENTION  12/06/2018  . CORONARY STENT INTERVENTION  N/A 12/06/2018   Procedure: CORONARY STENT INTERVENTION;  Surgeon: Jettie Booze, MD;  Location: [place] [facility];  Service: Cardiovascular;  Laterality: N/A;  . MINOR CARPAL TUNNEL    . REPLACEMENT TOTAL KNEE    . RIGHT/LEFT HEART CATH AND CORONARY ANGIOGRAPHY N/A 12/06/2018   Procedure: RIGHT/LEFT HEART CATH AND CORONARY ANGIOGRAPHY;  Surgeon: Jettie Booze, MD;  Location: [place] [facility];  Service: Cardiovascular;  Laterality: N/A;    Current  Outpatient Medications  Medication Sig Dispense Refill  . albuterol (PROVENTIL HFA;VENTOLIN HFA) 108 (90 Base) MCG/ACT inhaler Inhale 1 puff into the lungs every 6 (six) hours as needed for wheezing or shortness of breath. 1 Inhaler 2  . amLODipine (NORVASC) 5 MG tablet TAKE 1 TABLET EVERY DAY (Patient taking differently: Take 5 mg by mouth daily. ) 90 tablet 2  . atorvastatin (LIPITOR) 40 MG tablet TAKE 1 AND 1/2 TABLETS EVERY DAY 135 tablet 2  . clopidogrel (PLAVIX) 75 MG tablet Take 1 tablet (75 mg total) by mouth daily with breakfast. 90 tablet 3  . docusate sodium (COLACE) 100 MG capsule Take 100 mg by mouth 2 (two) times daily.    . hydrALAZINE (APRESOLINE) 100 MG tablet TAKE 1 TABLET THREE TIMES DAILY ( DOSE INCREASE ) (Patient taking differently: Take 100 mg by mouth 3 (three) times daily. ) 270 tablet 1  . hydrochlorothiazide (HYDRODIURIL) 12.5 MG tablet Take 12.5 mg by mouth every other day.    . isosorbide mononitrate (IMDUR) 60 MG 24 hr tablet TAKE 1 TABLET EVERY DAY 90 tablet 3  . losartan (COZAAR) 100 MG tablet Take 100 mg by mouth daily.    . magnesium hydroxide (MILK OF MAGNESIA) 400 MG/5ML suspension Take 15 mLs by mouth daily as needed for mild constipation.    . Multiple Vitamin (MULTIVITAMIN) tablet Take 1 tablet by mouth daily.    . nitroGLYCERIN (NITROSTAT) 0.4 MG SL tablet DISSOLVE 1 TABLET UNDER THE TONGUE EVERY 5 MINUTES AS NEEDED FOR CHEST PAIN 30 tablet 3  . Omega-3 Fatty Acids (OMEGA-3 FISH OIL) 1200 MG CAPS Take 1,200 mg by mouth 2 (two) times daily.     . pantoprazole (PROTONIX) 20 MG tablet Take 1 tablet (20 mg total) by mouth daily. 90 tablet 3  . Polyethyl Glycol-Propyl Glycol (SYSTANE ULTRA OP) Place 1 drop into both eyes daily as needed (dry eyes).    . Potassium Gluconate 595 MG CAPS Take 595 mg by mouth 2 (two) times daily.     . rivaroxaban (XARELTO) 20 MG TABS tablet Take 1 tablet (20 mg total) by mouth daily with supper. 90 tablet 3   No current  facility-administered medications for this visit.     Allergies as of 02/27/2019 - Review Complete 02/27/2019  Allergen Reaction Noted  . Morphine and related Swelling 04/12/2013    Family History  Problem Relation Age of Onset  . Heart disease Mother   . Colon cancer Brother   . Stomach cancer Sister   . Lupus Sister   . Heart disease Sister   . Diabetes Sister   . Hypertension Sister     Social History   Socioeconomic History  . Marital status: Married    Spouse name: Not on file  . Number of children: Not on file  . Years of education: Not on file  . Highest education level: Not on file  Occupational History  . Not on file  Social Needs  . Financial resource strain: Not on file  .  Food insecurity:    Worry: Not on file    Inability: Not on file  . Transportation needs:    Medical: Not on file    Non-medical: Not on file  Tobacco Use  . Smoking status: Former Smoker    Packs/day: 1.00    Years: 20.00    Pack years: 20.00    Types: Cigars    Start date: 07/18/1993    Last attempt to quit: 10/02/2017    Years since quitting: 1.4  . Smokeless tobacco: Never Used  Substance and Sexual Activity  . Alcohol use: Yes    Alcohol/week: 0.0 standard drinks    Comment: Socially  . Drug use: Never  . Sexual activity: Not on file  Lifestyle  . Physical activity:    Days per week: Not on file    Minutes per session: Not on file  . Stress: Not on file  Relationships  . Social connections:    Talks on phone: Not on file    Gets together: Not on file    Attends religious service: Not on file    Active member of club or organization: Not on file    Attends meetings of clubs or organizations: Not on file    Relationship status: Not on file  . Intimate partner violence:    Fear of current or ex partner: Not on file    Emotionally abused: Not on file    Physically abused: Not on file    Forced sexual activity: Not on file  Other Topics Concern  . Not on file   Social History Narrative  . Not on file    Review of Systems: Complete ROS negative except as per HPI.    Physical Exam: BP 130/69   Pulse 78   Temp (!) 96.9 F (36.1 C)   Ht 5\' 10"  (1.778 m)   Wt 214 lb 12.8 oz (97.4 kg)   BMI 30.82 kg/m  General:   Alert and oriented. Pleasant and cooperative. Well-nourished and well-developed.  Head:  Normocephalic and atraumatic. Eyes:  Without icterus, sclera clear and conjunctiva pink.  Ears:  Normal auditory acuity. Cardiovascular:  Irregularly irregular rhythm (consistent with AFib history) noted without murmurs appreciated. Extremities without clubbing or edema. Respiratory:  Clear to auscultation bilaterally. No wheezes, rales, or rhonchi. No distress.  Gastrointestinal:  +BS, soft, non-tender and non-distended. No HSM noted. No guarding or rebound. No masses appreciated.  Rectal:  Deferred  Musculoskalatal:  Symmetrical without gross deformities. Skin:  Intact without significant lesions or rashes. Neurologic:  Alert and oriented x4;  grossly normal neurologically. Psych:  Alert and cooperative. Normal mood and affect. Heme/Lymph/Immune: No excessive bruising noted.    02/27/2019 2:55 PM   Disclaimer: This note was dictated with voice recognition software. Similar sounding words can inadvertently be transcribed and may not be corrected upon review.

## 2019-02-27 NOTE — Telephone Encounter (Signed)
PA for EGD submitted via [employer] website. Case approved. PA# [id number], 03/09/19-06/07/19.

## 2019-02-28 ENCOUNTER — Encounter: Payer: Self-pay | Admitting: Gastroenterology

## 2019-03-01 ENCOUNTER — Telehealth: Payer: Self-pay

## 2019-03-01 NOTE — Telephone Encounter (Signed)
EG advised for pt to hold Xarelto x2 days prior to EGD scheduled for 03/09/19. Called and informed pt and his wife, verbalized understanding. Will start holding Xarelto 03/07/19.

## 2019-03-01 NOTE — Telephone Encounter (Signed)
-----   Message from Carlis Stable, NP sent at 03/01/2019  2:00 PM EDT ----- Juluis Rainier  Thanks! Randall Hiss ----- Message ----- From: Herminio Commons, MD Sent: 02/28/2019   9:33 AM EDT To: Carlis Stable, NP  Yes. And thank you Randall Hiss! ----- Message ----- From: Carlis Stable, NP Sent: 02/27/2019   3:11 PM EDT To: Herminio Commons, MD, Inge Rise, CMA, #  Good Afternoon!  I hope you're well. We were finally able to get this patient in for significant and symptomatic anemia along with likely melena. I want to set him up for an EGD in about a week. I know he just had a PCI in Feb. We generally DO NOT hold Plavix; however, are you ok with [place] doing the EGD and holding Xarelto?  Thanks!Randall Hiss

## 2019-03-01 NOTE — Progress Notes (Signed)
CC'D TO PCP °

## 2019-03-06 ENCOUNTER — Other Ambulatory Visit (HOSPITAL_COMMUNITY)
Admission: RE | Admit: 2019-03-06 | Discharge: 2019-03-06 | Disposition: A | Discharge status: Home / Self Care | Payer: Medicare PPO | Source: Ambulatory Visit | Attending: Gastroenterology | Admitting: Gastroenterology

## 2019-03-06 ENCOUNTER — Other Ambulatory Visit: Payer: Self-pay

## 2019-03-08 ENCOUNTER — Other Ambulatory Visit (HOSPITAL_COMMUNITY)
Admission: RE | Admit: 2019-03-08 | Discharge: 2019-03-08 | Disposition: A | Discharge status: Home / Self Care | Payer: Medicare PPO | Source: Ambulatory Visit | Attending: Gastroenterology | Admitting: Gastroenterology

## 2019-03-08 ENCOUNTER — Other Ambulatory Visit: Payer: Self-pay

## 2019-03-08 DIAGNOSIS — Z1159 Encounter for screening for other viral diseases: Secondary | ICD-10-CM | POA: Diagnosis present

## 2019-03-08 LAB — SARS CORONAVIRUS 2 BY RT PCR (HOSPITAL ORDER, PERFORMED IN ~~LOC~~ HOSPITAL LAB): SARS Coronavirus 2: NEGATIVE

## 2019-03-09 ENCOUNTER — Encounter (HOSPITAL_COMMUNITY)
Admission: RE | Disposition: A | Discharge status: Home / Self Care | Payer: Self-pay | Source: Home / Self Care | Attending: Gastroenterology

## 2019-03-09 ENCOUNTER — Encounter: Payer: Self-pay | Admitting: Nurse Practitioner

## 2019-03-09 ENCOUNTER — Ambulatory Visit (HOSPITAL_COMMUNITY)
Admission: RE | Admit: 2019-03-09 | Discharge: 2019-03-09 | Disposition: A | Discharge status: Home / Self Care | Payer: Medicare PPO | Attending: Gastroenterology | Admitting: Gastroenterology

## 2019-03-09 ENCOUNTER — Other Ambulatory Visit: Payer: Self-pay

## 2019-03-09 ENCOUNTER — Encounter (HOSPITAL_COMMUNITY): Payer: Self-pay | Admitting: *Deleted

## 2019-03-09 DIAGNOSIS — Z96659 Presence of unspecified artificial knee joint: Secondary | ICD-10-CM | POA: Insufficient documentation

## 2019-03-09 DIAGNOSIS — I2510 Atherosclerotic heart disease of native coronary artery without angina pectoris: Secondary | ICD-10-CM | POA: Insufficient documentation

## 2019-03-09 DIAGNOSIS — E119 Type 2 diabetes mellitus without complications: Secondary | ICD-10-CM | POA: Diagnosis not present

## 2019-03-09 DIAGNOSIS — D649 Anemia, unspecified: Secondary | ICD-10-CM | POA: Insufficient documentation

## 2019-03-09 DIAGNOSIS — Z79899 Other long term (current) drug therapy: Secondary | ICD-10-CM | POA: Insufficient documentation

## 2019-03-09 DIAGNOSIS — K2950 Unspecified chronic gastritis without bleeding: Secondary | ICD-10-CM | POA: Diagnosis not present

## 2019-03-09 DIAGNOSIS — E785 Hyperlipidemia, unspecified: Secondary | ICD-10-CM | POA: Diagnosis not present

## 2019-03-09 DIAGNOSIS — K319 Disease of stomach and duodenum, unspecified: Secondary | ICD-10-CM | POA: Diagnosis not present

## 2019-03-09 DIAGNOSIS — Z955 Presence of coronary angioplasty implant and graft: Secondary | ICD-10-CM | POA: Diagnosis not present

## 2019-03-09 DIAGNOSIS — K31811 Angiodysplasia of stomach and duodenum with bleeding: Secondary | ICD-10-CM | POA: Insufficient documentation

## 2019-03-09 DIAGNOSIS — K921 Melena: Secondary | ICD-10-CM

## 2019-03-09 DIAGNOSIS — I10 Essential (primary) hypertension: Secondary | ICD-10-CM | POA: Diagnosis not present

## 2019-03-09 DIAGNOSIS — Z85038 Personal history of other malignant neoplasm of large intestine: Secondary | ICD-10-CM | POA: Insufficient documentation

## 2019-03-09 DIAGNOSIS — K219 Gastro-esophageal reflux disease without esophagitis: Secondary | ICD-10-CM | POA: Insufficient documentation

## 2019-03-09 DIAGNOSIS — Z800 Family history of malignant neoplasm of digestive organs: Secondary | ICD-10-CM | POA: Insufficient documentation

## 2019-03-09 DIAGNOSIS — Z7901 Long term (current) use of anticoagulants: Secondary | ICD-10-CM | POA: Diagnosis not present

## 2019-03-09 HISTORY — DX: Unspecified atrial fibrillation: I48.91

## 2019-03-09 HISTORY — PX: ESOPHAGOGASTRODUODENOSCOPY: SHX5428

## 2019-03-09 HISTORY — PX: BIOPSY: SHX5522

## 2019-03-09 LAB — GLUCOSE, CAPILLARY: Glucose-Capillary: 108 mg/dL — ABNORMAL HIGH (ref 70–99)

## 2019-03-09 SURGERY — EGD (ESOPHAGOGASTRODUODENOSCOPY)
Anesthesia: Moderate Sedation

## 2019-03-09 MED ORDER — PANTOPRAZOLE SODIUM 40 MG PO TBEC: 40.0000 mg | DELAYED_RELEASE_TABLET | Freq: Every day | ORAL | 3 refills | Status: DC

## 2019-03-09 MED ORDER — LIDOCAINE VISCOUS HCL 2 % MT SOLN
OROMUCOSAL | Status: AC
  Filled 2019-03-09: qty 15

## 2019-03-09 MED ORDER — MIDAZOLAM HCL 5 MG/5ML IJ SOLN
INTRAMUSCULAR | Status: DC | PRN
  Administered 2019-03-09 (×4): 1 mg via INTRAVENOUS

## 2019-03-09 MED ORDER — ATROPINE SULFATE 1 MG/ML IJ SOLN
INTRAMUSCULAR | Status: DC | PRN
  Administered 2019-03-09: .25 mg via INTRAVENOUS

## 2019-03-09 MED ORDER — MEPERIDINE HCL 100 MG/ML IJ SOLN
INTRAMUSCULAR | Status: DC | PRN
  Administered 2019-03-09: 25 mg

## 2019-03-09 MED ORDER — MIDAZOLAM HCL 5 MG/5ML IJ SOLN
INTRAMUSCULAR | Status: AC
  Filled 2019-03-09: qty 10

## 2019-03-09 MED ORDER — SODIUM CHLORIDE 0.9 % IV SOLN
INTRAVENOUS | Status: DC
  Administered 2019-03-09: 11:00:00 via INTRAVENOUS

## 2019-03-09 MED ORDER — MEPERIDINE HCL 100 MG/ML IJ SOLN
INTRAMUSCULAR | Status: AC
  Filled 2019-03-09: qty 2

## 2019-03-09 MED ORDER — ATROPINE SULFATE 1 MG/ML IJ SOLN
INTRAMUSCULAR | Status: AC
  Filled 2019-03-09: qty 1

## 2019-03-09 NOTE — Op Note (Signed)
[facility] Patient Name: Antonio Watkins Procedure Date: 03/09/2019 12:59 PM MRN: [id number] Date of Birth: [birth year] Attending MD: Barney Drain MD, MD CSN: [id number] Age: 85 Admit Type: Outpatient Procedure:                Upper GI endoscopy WITH COLD FOCREPS BIOPSY/APC Indications:              Anemia Providers:                Barney Drain MD, MD, Janeece Riggers, RN, Raphael Gibney, Technician, Randa Spike, Technician Referring MD:             [name] MD, MD Medicines:                Propofol per Anesthesia Complications:            No immediate complications. Estimated Blood Loss:     Estimated blood loss was minimal. Procedure:                Pre-Anesthesia Assessment:                           - Prior to the procedure, a History and Physical                            was performed, and patient medications and                            allergies were reviewed. The patient's tolerance of                            previous anesthesia was also reviewed. The risks                            and benefits of the procedure and the sedation                            options and risks were discussed with the patient.                            All questions were answered, and informed consent                            was obtained. Prior Anticoagulants: The patient has                            taken Xarelto (rivaroxaban), last dose was 1 day                            prior to procedure. ASA Grade Assessment: II - A                            patient with mild systemic disease. After reviewing  the risks and benefits, the patient was deemed in                            satisfactory condition to undergo the procedure.                            After obtaining informed consent, the endoscope was                            passed under direct vision. Throughout the                            procedure, the patient's  blood pressure, pulse, and                            oxygen saturations were monitored continuously. The                            [id number]-[id number] ([id number]) scope was introduced through the                            mouth, and advanced to the second part of duodenum.                            The patient tolerated the procedure well. The upper                            GI endoscopy was accomplished without difficulty. Scope In: 1:47:17 PM Scope Out: 2:01:23 PM Total Procedure Duration: 0 hours 14 minutes 6 seconds  Findings:      The examined esophagus was normal.      ONE OF FOUR small angioectasias with bleeding were found in the cardia       and in the gastric body. Coagulation for hemostasis using argon plasma       at 0.8 liters/minute and 30 watts was successful. Estimated blood loss       was minimal.      The examined duodenum was normal.      Patchy mild inflammation characterized by congestion (edema), erosions       and erythema was found in the gastric body and in the gastric antrum.       Biopsies(2: body, 1: incisura, 3: antrum) were taken with a cold forceps       for Helicobacter pylori testing. 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                           - Continue present medications. RE-START XARELTO  MAY 24.                           - Await pathology results.                           - Return to GI office in 4 months. Procedure Code(s):        --- Professional ---                           [phone], Esophagogastroduodenoscopy, flexible,                            transoral; with control of bleeding, any method Diagnosis Code(s):        --- Professional ---                           R94.585, Angiodysplasia of stomach and duodenum                             with bleeding                           D64.9, Anemia, unspecified CPT copyright 2019 American Medical Association. All rights reserved. The codes documented in this report are preliminary and upon coder review may  be revised to meet current compliance requirements. Barney Drain, MD Barney Drain MD, MD 03/09/2019 2:21:04 PM This report has been signed electronically. Number of Addenda: 0

## 2019-03-09 NOTE — H&P (Addendum)
Primary Care Physician:  Jacqualine Code, DO Primary Gastroenterologist:  Dr. Oneida Alar  Pre-Procedure History & Physical: HPI:  Antonio Watkins is a 85 y.o. male here for [place].  Past Medical History:  Diagnosis Date  . Anginal pain ([place])   . CAD (coronary artery disease)   . Cancer of sigmoid colon ([place])    approx 1990  . DM type 2 (diabetes mellitus, type 2) ([place])   . GERD (gastroesophageal reflux disease)   . Gout   . Hiatal hernia   . Hyperlipemia   . Hypertension   . Incontinence     Past Surgical History:  Procedure Laterality Date  . cataracts    . CHOLECYSTECTOMY    . CORONARY STENT INTERVENTION  12/06/2018  . CORONARY STENT INTERVENTION N/A 12/06/2018   Procedure: CORONARY STENT INTERVENTION;  Surgeon: Jettie Booze, MD;  Location: [place] [facility];  Service: Cardiovascular;  Laterality: N/A;  . MINOR CARPAL TUNNEL    . REPLACEMENT TOTAL KNEE    . RIGHT/LEFT HEART CATH AND CORONARY ANGIOGRAPHY N/A 12/06/2018   Procedure: RIGHT/LEFT HEART CATH AND CORONARY ANGIOGRAPHY;  Surgeon: Jettie Booze, MD;  Location: [place] [facility];  Service: Cardiovascular;  Laterality: N/A;    Prior to Admission medications   Medication Sig Start Date End Date Taking? Authorizing Provider  amLODipine (NORVASC) 5 MG tablet TAKE 1 TABLET EVERY DAY Patient taking differently: Take 5 mg by mouth daily.  08/21/18  Yes Herminio Commons, MD  atorvastatin (LIPITOR) 40 MG tablet TAKE 1 AND 1/2 TABLETS EVERY DAY Patient taking differently: Take 60 mg by mouth daily.  12/05/18  Yes Herminio Commons, MD  clopidogrel (PLAVIX) 75 MG tablet Take 1 tablet (75 mg total) by mouth daily with breakfast. 12/25/18  Yes Kilroy, Luke K, PA-C  docusate sodium (COLACE) 100 MG capsule Take 100 mg by mouth daily.    Yes [provider]  hydrALAZINE (APRESOLINE) 100 MG tablet TAKE 1 TABLET THREE TIMES DAILY ( DOSE INCREASE ) Patient taking differently: Take 100 mg by  mouth 3 (three) times daily.  07/31/18  Yes Herminio Commons, MD  hydrochlorothiazide (HYDRODIURIL) 12.5 MG tablet Take 12.5 mg by mouth daily with breakfast.    Yes [provider]  isosorbide mononitrate (IMDUR) 60 MG 24 hr tablet TAKE 1 TABLET EVERY DAY Patient taking differently: Take 60 mg by mouth daily. TAKE 1 TABLET EVERY DAY 12/11/18  Yes Herminio Commons, MD  losartan (COZAAR) 100 MG tablet Take 100 mg by mouth daily.   Yes [provider]  magnesium hydroxide (MILK OF MAGNESIA) 400 MG/5ML suspension Take 15 mLs by mouth daily as needed for mild constipation.   Yes [provider]  Multiple Vitamin (MULTIVITAMIN WITH MINERALS) TABS tablet Take 1 tablet by mouth daily. Centrum Silver   Yes [provider]  Omega-3 Fatty Acids (OMEGA-3 FISH OIL) 1200 MG CAPS Take 1,200 mg by mouth daily.    Yes [provider]  pantoprazole (PROTONIX) 20 MG tablet Take 1 tablet (20 mg total) by mouth daily. 12/25/18 03/25/19 Yes Kilroy, Luke K, PA-C  Polyethyl Glycol-Propyl Glycol (SYSTANE ULTRA OP) Place 1 drop into both eyes daily as needed (dry eyes).   Yes [provider]  rivaroxaban (XARELTO) 20 MG TABS tablet Take 1 tablet (20 mg total) by mouth daily with supper. 12/25/18  Yes Kilroy, Luke K, PA-C  vitamin B-12 (CYANOCOBALAMIN) 1000 MCG tablet Take 1,000 mcg by mouth every Sunday.  Yes [provider]  albuterol (PROVENTIL HFA;VENTOLIN HFA) 108 (90 Base) MCG/ACT inhaler Inhale 1 puff into the lungs every 6 (six) hours as needed for wheezing or shortness of breath. 01/23/19   Arnoldo Lenis, MD  nitroGLYCERIN (NITROSTAT) 0.4 MG SL tablet DISSOLVE 1 TABLET UNDER THE TONGUE EVERY 5 MINUTES AS NEEDED FOR CHEST PAIN 12/07/18   Sande Rives E, PA-C    Allergies as of 02/27/2019 - Review Complete 02/27/2019  Allergen Reaction Noted  . Morphine and related Swelling 04/12/2013    Family History  Problem Relation Age of Onset  . Heart  disease Mother   . Colon cancer Brother   . Stomach cancer Sister   . Lupus Sister   . Heart disease Sister   . Diabetes Sister   . Hypertension Sister     Social History   Socioeconomic History  . Marital status: Married    Spouse name: Not on file  . Number of children: Not on file  . Years of education: Not on file  . Highest education level: Not on file  Occupational History  . Not on file  Social Needs  . Financial resource strain: Not on file  . Food insecurity:    Worry: Not on file    Inability: Not on file  . Transportation needs:    Medical: Not on file    Non-medical: Not on file  Tobacco Use  . Smoking status: Former Smoker    Packs/day: 1.00    Years: 20.00    Pack years: 20.00    Types: Cigars    Start date: 07/18/1993    Last attempt to quit: 10/02/2017    Years since quitting: 1.4  . Smokeless tobacco: Never Used  Substance and Sexual Activity  . Alcohol use: Yes    Alcohol/week: 0.0 standard drinks    Comment: Socially  . Drug use: Never  . Sexual activity: Not on file  Lifestyle  . Physical activity:    Days per week: Not on file    Minutes per session: Not on file  . Stress: Not on file  Relationships  . Social connections:    Talks on phone: Not on file    Gets together: Not on file    Attends religious service: Not on file    Active member of club or organization: Not on file    Attends meetings of clubs or organizations: Not on file    Relationship status: Not on file  . Intimate partner violence:    Fear of current or ex partner: Not on file    Emotionally abused: Not on file    Physically abused: Not on file    Forced sexual activity: Not on file  Other Topics Concern  . Not on file  Social History Narrative  . Not on file    Review of Systems: See HPI, otherwise negative ROS   Physical Exam: BP (!) 153/70   Pulse 82   Temp 98.6 F (37 C) (Oral)   Resp 16   Ht 5\' 10"  (1.778 m)   Wt 97.4 kg   SpO2 (!) 89%   BMI  30.82 kg/m  General:   Alert,  pleasant and cooperative in NAD Head:  Normocephalic and atraumatic. Neck:  Supple; Lungs:  Clear throughout to auscultation.    Heart:  Regular rate and IRREGULAR rhythm. Abdomen:  Soft, nontender and nondistended. Normal bowel sounds, without guarding, and without rebound.   Neurologic:  Alert and  oriented  x4;  grossly normal neurologically.  Impression/Plan:     NORMOCYTIC ANEMIA  PLAN: 1. EGD TODAY.  DISCUSSED PROCEDURE, BENEFITS, & RISKS: < 1% chance of medication reaction, bleeding, perforation, or ASPIRATION.

## 2019-03-09 NOTE — Discharge Instructions (Signed)
THE LOW BLOOD COUNT AND DARK STOOL WAS DUE TO arteriovenous malformation, A COLLECTION OF BLOOD VESSELS ON THE SURFACE OF THE STOMACH THAT CAN BLEED. I CAUTERIZED  AND THEY ARE GONE. You have mild gastritis. YOUR SMALL BOWEL IS NORMAL. I biopsied your stomach.    RE-START XARELTO ON MAY 24.  DRINK WATER TO KEEP YOUR URINE LIGHT YELLOW.  FOLLOW A LOW FAT DIET. MEATS SHOULD BE BAKED, BROILED, OR BOILED. AVOID FRIED FOODS. SEE INFO BELOW.  CONTINUE PROTONIX.  INCREASE DOSE TO 40 MG DAILY.   YOUR BIOPSY RESULTS WILL BE BACK IN 5 BUSINESS DAYS.  FOLLOW UP IN 4 MOS.  UPPER ENDOSCOPY AFTER CARE Read the instructions outlined below and refer to this sheet in the next week. These discharge instructions provide you with general information on caring for yourself after you leave the hospital. While your treatment has been planned according to the most current medical practices available, unavoidable complications occasionally occur. If you have any problems or questions after discharge, call DR. Rorie Delmore, [phone].  ACTIVITY  You may resume your regular activity, but move at a slower pace for the next 24 hours.   Take frequent rest periods for the next 24 hours.   Walking will help get rid of the air and reduce the bloated feeling in your belly (abdomen).   No driving for 24 hours (because of the medicine (anesthesia) used during the test).   You may shower.   Do not sign any important legal documents or operate any machinery for 24 hours (because of the anesthesia used during the test).    NUTRITION  Drink plenty of fluids.   You may resume your normal diet as instructed by your doctor.   Begin with a light meal and progress to your normal diet. Heavy or fried foods are harder to digest and may make you feel sick to your stomach (nauseated).   Avoid alcoholic beverages for 24 hours or as instructed.    MEDICATIONS  You may resume your normal medications.   WHAT YOU CAN  EXPECT TODAY  Some feelings of bloating in the abdomen.   Passage of more gas than usual.    IF YOU HAD A BIOPSY TAKEN DURING THE UPPER ENDOSCOPY:  Eat a soft diet IF YOU HAVE NAUSEA, BLOATING, ABDOMINAL PAIN, OR VOMITING.    FINDING OUT THE RESULTS OF YOUR TEST Not all test results are available during your visit. DR. Oneida Alar WILL CALL YOU WITHIN 14 DAYS OF YOUR PROCEDUE WITH YOUR RESULTS. Do not assume everything is normal if you have not heard from DR. Dante Roudebush, CALL HER OFFICE AT [phone].  SEEK IMMEDIATE MEDICAL ATTENTION AND CALL THE OFFICE: [phone] IF:  You have more than a spotting of blood in your stool.   Your belly is swollen (abdominal distention).   You are nauseated or vomiting.   You have a temperature over 101F.   You have abdominal pain or discomfort that is severe or gets worse throughout the day.  Arteriovenous Malformation An arteriovenous malformation (AVM) is a disorder that has been present since birth (congenital). It is characterized by a complex, tangled web of arteries and veins. An AVM may occur in the STOMACH, COLON, OR SMALL BOWEL.  SYMPTOMS  The most common problems (symptoms) of AVM include:  Bleeding (hemorrhaging).   ANEMIA  TREATMENT  There are three general forms of treatment for AVM: **ABLATION WITH HEAT   Gastritis  Gastritis is an inflammation (the body's way of reacting to injury  and/or infection) of the stomach. It is often caused by viral or bacterial (germ) infections. It can also be caused BY ASPIRIN, BC/GOODY POWDER'S, (IBUPROFEN) MOTRIN, OR ALEVE (NAPROXEN), chemicals (including alcohol), SPICY FOODS, and medications. This illness may be associated with generalized malaise (feeling tired, not well), UPPER ABDOMINAL STOMACH cramps, and fever. One common bacterial cause of gastritis is an organism known as H. Pylori. This can be treated with antibiotics.    Low-Fat Diet  BREADS, CEREALS, PASTA, RICE, DRIED PEAS, AND  BEANS These products are high in carbohydrates and most are low in fat. Therefore, they can be increased in the diet as substitutes for fatty foods. They too, however, contain calories and should not be eaten in excess. Cereals can be eaten for snacks as well as for breakfast.  Include foods that contain fiber (fruits, vegetables, whole grains, and legumes). Research shows that fiber may lower blood cholesterol levels, especially the water-soluble fiber found in fruits, vegetables, oat products, and legumes.  FRUITS AND VEGETABLES It is good to eat fruits and vegetables. Besides being sources of fiber, both are rich in vitamins and some minerals. They help you get the daily allowances of these nutrients. Fruits and vegetables can be used for snacks and desserts.  MEATS Limit lean meat, chicken, [place], and fish to no more than 6 ounces per day.  Beef, Pork, and Lamb Use lean cuts of beef, pork, and lamb. Lean cuts include:  Extra-lean ground beef.  Arm roast.  Sirloin tip.  Center-cut ham.  Round steak.  Loin chops.  Rump roast.  Tenderloin.  Trim all fat off the outside of meats before cooking. It is not necessary to severely decrease the intake of red meat, but lean choices should be made. Lean meat is rich in protein and contains a highly absorbable form of iron. Premenopausal women, in particular, should avoid reducing lean red meat because this could increase the risk for low red blood cells (iron-deficiency anemia).  Chicken and [place] These are good sources of protein. The fat of poultry can be reduced by removing the skin and underlying fat layers before cooking. Chicken and [place] can be substituted for lean red meat in the diet. Poultry should not be fried or covered with high-fat sauces.  Fish and Shellfish Fish is a good source of protein. Shellfish contain cholesterol, but they usually are low in saturated fatty acids. The preparation of fish is important. Like chicken and  [place], they should not be fried or covered with high-fat sauces.  EGGS Egg whites contain no fat or cholesterol. They can be eaten often. Try 1 to 2 egg whites instead of whole eggs in recipes or use egg substitutes that do not contain yolk.  MILK AND DAIRY PRODUCTS Use skim or 1% milk instead of 2% or whole milk. Decrease whole milk, natural, and processed cheeses. Use nonfat or low-fat (2%) cottage cheese or low-fat cheeses made from vegetable oils. Choose nonfat or low-fat (1 to 2%) yogurt. Experiment with evaporated skim milk in recipes that call for heavy cream. Substitute low-fat yogurt or low-fat cottage cheese for sour cream in dips and salad dressings. Have at least 2 servings of low-fat dairy products, such as 2 glasses of skim (or 1%) milk each day to help get your daily calcium intake.  FATS AND OILS Reduce the total intake of fats, especially saturated fat. Butterfat, lard, and beef fats are high in saturated fat and cholesterol. These should be avoided as much as possible. Vegetable fats  do not contain cholesterol, but certain vegetable fats, such as coconut oil, palm oil, and palm kernel oil are very high in saturated fats. These should be limited. These fats are often used in bakery goods, processed foods, popcorn, oils, and nondairy creamers. Vegetable shortenings and some peanut butters contain hydrogenated oils, which are also saturated fats. Read the labels on these foods and check for saturated vegetable oils.  Unsaturated vegetable oils and fats do not raise blood cholesterol. However, they should be limited because they are fats and are high in calories. Total fat should still be limited to 30% of your daily caloric intake. Desirable liquid vegetable oils are corn oil, cottonseed oil, olive oil, canola oil, safflower oil, soybean oil, and sunflower oil. Peanut oil is not as good, but small amounts are acceptable. Buy a heart-healthy tub margarine that has no partially hydrogenated  oils in the ingredients. Mayonnaise and salad dressings often are made from unsaturated fats, but they should also be limited because of their high calorie and fat content. Seeds, nuts, peanut butter, olives, and avocados are high in fat, but the fat is mainly the unsaturated type. These foods should be limited mainly to avoid excess calories and fat.  OTHER EATING TIPS Snacks  Most sweets should be limited as snacks. They tend to be rich in calories and fats, and their caloric content outweighs their nutritional value. Some good choices in snacks are graham crackers, melba toast, soda crackers, bagels (no egg), English muffins, fruits, and vegetables. These snacks are preferable to snack crackers, [place] fries, and chips. Popcorn should be air-popped or cooked in small amounts of liquid vegetable oil.  Desserts Eat fruit, low-fat yogurt, and fruit ices instead of pastries, cake, and cookies. Sherbet, angel food cake, gelatin dessert, frozen low-fat yogurt, or other frozen products that do not contain saturated fat (pure fruit juice bars, frozen ice pops) are also acceptable.   COOKING METHODS Choose those methods that use little or no fat. They include: Poaching.  Braising.  Steaming.  Grilling.  Baking.  Stir-frying.  Broiling.  Microwaving.  Foods can be cooked in a nonstick pan without added fat, or use a nonfat cooking spray in regular cookware. Limit fried foods and avoid frying in saturated fat. Add moisture to lean meats by using water, broth, cooking wines, and other nonfat or low-fat sauces along with the cooking methods mentioned above. Soups and stews should be chilled after cooking. The fat that forms on top after a few hours in the refrigerator should be skimmed off. When preparing meals, avoid using excess salt. Salt can contribute to raising blood pressure in some people.  EATING AWAY FROM HOME Order entres, potatoes, and vegetables without sauces or butter. When meat exceeds  the size of a deck of cards (3 to 4 ounces), the rest can be taken home for another meal. Choose vegetable or fruit salads and ask for low-calorie salad dressings to be served on the side. Use dressings sparingly. Limit high-fat toppings, such as bacon, crumbled eggs, cheese, sunflower seeds, and olives. Ask for heart-healthy tub margarine instead of butter.

## 2019-03-14 ENCOUNTER — Telehealth: Payer: Self-pay | Admitting: Gastroenterology

## 2019-03-14 ENCOUNTER — Encounter (HOSPITAL_COMMUNITY): Payer: Self-pay | Admitting: Gastroenterology

## 2019-03-14 NOTE — Telephone Encounter (Signed)
Please call pt. His stomach Bx shows gastritis. He does not have H pylori infection.    DRINK WATER TO KEEP YOUR URINE LIGHT YELLOW.  FOLLOW A LOW FAT DIET. MEATS SHOULD BE BAKED, BROILED, OR BOILED. AVOID FRIED FOODS.   CONTINUE PROTONIX.  INCREASE DOSE TO 40 MG DAILY.   FOLLOW UP IN 4 MOS.

## 2019-03-14 NOTE — Telephone Encounter (Signed)
PT is aware.

## 2019-03-15 ENCOUNTER — Ambulatory Visit: Payer: Medicare PPO | Admitting: Cardiovascular Disease

## 2019-03-16 ENCOUNTER — Telehealth: Payer: Medicare PPO | Admitting: Cardiovascular Disease

## 2019-03-21 ENCOUNTER — Other Ambulatory Visit: Payer: Self-pay

## 2019-03-21 ENCOUNTER — Observation Stay (HOSPITAL_COMMUNITY)
Admission: AD | Admit: 2019-03-21 | Discharge: 2019-03-22 | Disposition: A | Discharge status: Home / Self Care | Payer: Medicare PPO | Source: Other Acute Inpatient Hospital | Attending: Internal Medicine | Admitting: Internal Medicine

## 2019-03-21 ENCOUNTER — Encounter (HOSPITAL_COMMUNITY)
Admission: AD | Disposition: A | Discharge status: Home / Self Care | Payer: Self-pay | Source: Other Acute Inpatient Hospital | Attending: Internal Medicine

## 2019-03-21 ENCOUNTER — Encounter (HOSPITAL_COMMUNITY): Payer: Self-pay | Admitting: *Deleted

## 2019-03-21 DIAGNOSIS — K219 Gastro-esophageal reflux disease without esophagitis: Secondary | ICD-10-CM | POA: Diagnosis not present

## 2019-03-21 DIAGNOSIS — Z955 Presence of coronary angioplasty implant and graft: Secondary | ICD-10-CM | POA: Insufficient documentation

## 2019-03-21 DIAGNOSIS — I10 Essential (primary) hypertension: Secondary | ICD-10-CM | POA: Diagnosis not present

## 2019-03-21 DIAGNOSIS — Z1159 Encounter for screening for other viral diseases: Secondary | ICD-10-CM | POA: Diagnosis not present

## 2019-03-21 DIAGNOSIS — K254 Chronic or unspecified gastric ulcer with hemorrhage: Secondary | ICD-10-CM

## 2019-03-21 DIAGNOSIS — K259 Gastric ulcer, unspecified as acute or chronic, without hemorrhage or perforation: Secondary | ICD-10-CM | POA: Insufficient documentation

## 2019-03-21 DIAGNOSIS — Z79899 Other long term (current) drug therapy: Secondary | ICD-10-CM | POA: Diagnosis not present

## 2019-03-21 DIAGNOSIS — I2510 Atherosclerotic heart disease of native coronary artery without angina pectoris: Secondary | ICD-10-CM

## 2019-03-21 DIAGNOSIS — I4819 Other persistent atrial fibrillation: Secondary | ICD-10-CM | POA: Diagnosis present

## 2019-03-21 DIAGNOSIS — K921 Melena: Principal | ICD-10-CM | POA: Diagnosis present

## 2019-03-21 DIAGNOSIS — F17290 Nicotine dependence, other tobacco product, uncomplicated: Secondary | ICD-10-CM | POA: Insufficient documentation

## 2019-03-21 DIAGNOSIS — Z7902 Long term (current) use of antithrombotics/antiplatelets: Secondary | ICD-10-CM | POA: Diagnosis not present

## 2019-03-21 DIAGNOSIS — I4891 Unspecified atrial fibrillation: Secondary | ICD-10-CM | POA: Insufficient documentation

## 2019-03-21 DIAGNOSIS — Z96659 Presence of unspecified artificial knee joint: Secondary | ICD-10-CM | POA: Diagnosis not present

## 2019-03-21 DIAGNOSIS — Z9861 Coronary angioplasty status: Secondary | ICD-10-CM

## 2019-03-21 DIAGNOSIS — M109 Gout, unspecified: Secondary | ICD-10-CM | POA: Diagnosis not present

## 2019-03-21 DIAGNOSIS — Z7901 Long term (current) use of anticoagulants: Secondary | ICD-10-CM | POA: Diagnosis not present

## 2019-03-21 DIAGNOSIS — Q2733 Arteriovenous malformation of digestive system vessel: Secondary | ICD-10-CM | POA: Diagnosis not present

## 2019-03-21 DIAGNOSIS — K449 Diaphragmatic hernia without obstruction or gangrene: Secondary | ICD-10-CM | POA: Insufficient documentation

## 2019-03-21 DIAGNOSIS — E785 Hyperlipidemia, unspecified: Secondary | ICD-10-CM | POA: Diagnosis not present

## 2019-03-21 HISTORY — PX: ESOPHAGOGASTRODUODENOSCOPY: SHX5428

## 2019-03-21 LAB — COMPREHENSIVE METABOLIC PANEL
ALT: 16 U/L (ref 0–44)
AST: 19 U/L (ref 15–41)
Albumin: 3.4 g/dL — ABNORMAL LOW (ref 3.5–5.0)
Alkaline Phosphatase: 46 U/L (ref 38–126)
Anion gap: 9 (ref 5–15)
BUN: 13 mg/dL (ref 8–23)
CO2: 23 mmol/L (ref 22–32)
Calcium: 8.8 mg/dL — ABNORMAL LOW (ref 8.9–10.3)
Chloride: 101 mmol/L (ref 98–111)
Creatinine, Ser: 1.17 mg/dL (ref 0.61–1.24)
GFR calc Af Amer: >60 mL/min (ref >60)
GFR calc non Af Amer: 56 mL/min — ABNORMAL LOW (ref >60)
Glucose, Bld: 99 mg/dL (ref 70–99)
Potassium: 3.8 mmol/L (ref 3.5–5.1)
Sodium: 133 mmol/L — ABNORMAL LOW (ref 135–145)
Total Bilirubin: 0.5 mg/dL (ref 0.3–1.2)
Total Protein: 6.6 g/dL (ref 6.5–8.1)

## 2019-03-21 LAB — MAGNESIUM: Magnesium: 1.9 mg/dL (ref 1.7–2.4)

## 2019-03-21 LAB — CBC
HCT: 26.7 % — ABNORMAL LOW (ref 39.0–52.0)
HCT: 26.2 % — ABNORMAL LOW (ref 39.0–52.0)
Hemoglobin: 8.3 g/dL — ABNORMAL LOW (ref 13.0–17.0)
Hemoglobin: 8.1 g/dL — ABNORMAL LOW (ref 13.0–17.0)
MCH: 25.6 pg — ABNORMAL LOW (ref 26.0–34.0)
MCH: 25.4 pg — ABNORMAL LOW (ref 26.0–34.0)
MCHC: 31.1 g/dL (ref 30.0–36.0)
MCHC: 30.9 g/dL (ref 30.0–36.0)
MCV: 82.4 fL (ref 80.0–100.0)
MCV: 82.1 fL (ref 80.0–100.0)
Platelets: 255 10*3/uL (ref 150–400)
Platelets: 280 10*3/uL (ref 150–400)
RBC: 3.24 MIL/uL — ABNORMAL LOW (ref 4.22–5.81)
RBC: 3.19 MIL/uL — ABNORMAL LOW (ref 4.22–5.81)
RDW: 16.2 % — ABNORMAL HIGH (ref 11.5–15.5)
RDW: 16.2 % — ABNORMAL HIGH (ref 11.5–15.5)
WBC: 4.1 10*3/uL (ref 4.0–10.5)
WBC: 4.4 10*3/uL (ref 4.0–10.5)
nRBC: 0 % (ref 0.0–0.2)
nRBC: 0 % (ref 0.0–0.2)

## 2019-03-21 LAB — SARS CORONAVIRUS 2 BY RT PCR (HOSPITAL ORDER, PERFORMED IN ~~LOC~~ HOSPITAL LAB): SARS Coronavirus 2: NEGATIVE

## 2019-03-21 LAB — BASIC METABOLIC PANEL
Anion gap: 9 (ref 5–15)
BUN: 14 mg/dL (ref 8–23)
CO2: 22 mmol/L (ref 22–32)
Calcium: 8.7 mg/dL — ABNORMAL LOW (ref 8.9–10.3)
Chloride: 104 mmol/L (ref 98–111)
Creatinine, Ser: 1.15 mg/dL (ref 0.61–1.24)
GFR calc Af Amer: >60 mL/min (ref >60)
GFR calc non Af Amer: 57 mL/min — ABNORMAL LOW (ref >60)
Glucose, Bld: 94 mg/dL (ref 70–99)
Potassium: 3.9 mmol/L (ref 3.5–5.1)
Sodium: 135 mmol/L (ref 135–145)

## 2019-03-21 LAB — PHOSPHORUS: Phosphorus: 2.8 mg/dL (ref 2.5–4.6)

## 2019-03-21 SURGERY — EGD (ESOPHAGOGASTRODUODENOSCOPY)
Anesthesia: Moderate Sedation

## 2019-03-21 MED ORDER — HYDRALAZINE HCL 25 MG PO TABS
100.0000 mg | ORAL_TABLET | Freq: Three times a day (TID) | ORAL | Status: DC
  Administered 2019-03-21 – 2019-03-22 (×2): 100 mg via ORAL
  Filled 2019-03-21 (×2): qty 4

## 2019-03-21 MED ORDER — ONDANSETRON HCL 4 MG PO TABS: 4.0000 mg | ORAL_TABLET | Freq: Four times a day (QID) | ORAL | Status: DC | PRN

## 2019-03-21 MED ORDER — HYDROCHLOROTHIAZIDE 25 MG PO TABS
12.5000 mg | ORAL_TABLET | Freq: Every day | ORAL | Status: DC
  Filled 2019-03-21: qty 1

## 2019-03-21 MED ORDER — MEPERIDINE HCL 50 MG/ML IJ SOLN
INTRAMUSCULAR | Status: DC | PRN
  Administered 2019-03-21: 20 mg via INTRAVENOUS
  Administered 2019-03-21: 10 mg via INTRAVENOUS
  Administered 2019-03-21: 20 mg via INTRAVENOUS

## 2019-03-21 MED ORDER — AMLODIPINE BESYLATE 5 MG PO TABS
5.0000 mg | ORAL_TABLET | Freq: Every day | ORAL | Status: DC
  Administered 2019-03-22: 5 mg via ORAL
  Filled 2019-03-21: qty 1

## 2019-03-21 MED ORDER — MIDAZOLAM HCL 5 MG/5ML IJ SOLN
INTRAMUSCULAR | Status: DC | PRN
  Administered 2019-03-21 (×3): 1 mg via INTRAVENOUS

## 2019-03-21 MED ORDER — ONDANSETRON HCL 4 MG/2ML IJ SOLN: 4.0000 mg | Freq: Four times a day (QID) | INTRAMUSCULAR | Status: DC | PRN

## 2019-03-21 MED ORDER — LIDOCAINE VISCOUS HCL 2 % MT SOLN
OROMUCOSAL | Status: AC
  Filled 2019-03-21: qty 15

## 2019-03-21 MED ORDER — ACETAMINOPHEN 325 MG PO TABS: 650.0000 mg | ORAL_TABLET | Freq: Four times a day (QID) | ORAL | Status: DC | PRN

## 2019-03-21 MED ORDER — ACETAMINOPHEN 650 MG RE SUPP: 650.0000 mg | Freq: Four times a day (QID) | RECTAL | Status: DC | PRN

## 2019-03-21 MED ORDER — MIDAZOLAM HCL 5 MG/5ML IJ SOLN
INTRAMUSCULAR | Status: AC
  Filled 2019-03-21: qty 10

## 2019-03-21 MED ORDER — HYDRALAZINE HCL 25 MG PO TABS
25.0000 mg | ORAL_TABLET | Freq: Four times a day (QID) | ORAL | Status: DC | PRN
  Administered 2019-03-21: 25 mg via ORAL
  Filled 2019-03-21: qty 1

## 2019-03-21 MED ORDER — DOCUSATE SODIUM 100 MG PO CAPS
100.0000 mg | ORAL_CAPSULE | Freq: Every day | ORAL | Status: DC
  Administered 2019-03-22: 100 mg via ORAL
  Filled 2019-03-21: qty 1

## 2019-03-21 MED ORDER — STERILE WATER FOR IRRIGATION IR SOLN
Status: DC | PRN
  Administered 2019-03-21: 2.5 mL

## 2019-03-21 MED ORDER — CLOPIDOGREL BISULFATE 75 MG PO TABS
75.0000 mg | ORAL_TABLET | Freq: Every day | ORAL | Status: DC
  Filled 2019-03-21: qty 1

## 2019-03-21 MED ORDER — PANTOPRAZOLE SODIUM 40 MG PO TBEC
40.0000 mg | DELAYED_RELEASE_TABLET | Freq: Every day | ORAL | Status: DC
  Administered 2019-03-22: 40 mg via ORAL
  Filled 2019-03-21: qty 1

## 2019-03-21 MED ORDER — ISOSORBIDE MONONITRATE ER 60 MG PO TB24
60.0000 mg | ORAL_TABLET | Freq: Every day | ORAL | Status: DC
  Administered 2019-03-22: 60 mg via ORAL
  Filled 2019-03-21: qty 1

## 2019-03-21 MED ORDER — LOSARTAN POTASSIUM 50 MG PO TABS
100.0000 mg | ORAL_TABLET | Freq: Every day | ORAL | Status: DC
  Administered 2019-03-22: 100 mg via ORAL
  Filled 2019-03-21: qty 2

## 2019-03-21 MED ORDER — MEPERIDINE HCL 50 MG/ML IJ SOLN
INTRAMUSCULAR | Status: AC
  Filled 2019-03-21: qty 1

## 2019-03-21 MED ORDER — LIDOCAINE VISCOUS HCL 2 % MT SOLN
OROMUCOSAL | Status: DC | PRN
  Administered 2019-03-21: 1 via OROMUCOSAL

## 2019-03-21 MED ORDER — ATORVASTATIN CALCIUM 40 MG PO TABS
60.0000 mg | ORAL_TABLET | Freq: Every day | ORAL | Status: DC
  Administered 2019-03-22: 60 mg via ORAL
  Filled 2019-03-21: qty 1

## 2019-03-21 NOTE — Telephone Encounter (Signed)
Pt has OV scheduled for 05/16/2019. Should he keep that or do I need to schedule for OCT

## 2019-03-21 NOTE — Progress Notes (Signed)
Brief EGD note:  Normal mucosa of esophagus and GE junction. Small ulcer at gastric fundus and body covered with a tiny clot but no bleeding induced on washing these areas.  These lesions have resolved.  APC ablation of AV malformations on 03/09/2019. Small superficial ulcers at antrum felt to be sites of prior AV malformations ablated with APC. Normal bulbar and post bulbar mucosa.   Will proceed with small bowel given capsule study tomorrow.

## 2019-03-21 NOTE — Consult Note (Signed)
Referring Provider: No ref. provider found Primary Care Physician:  Jacqualine Code, DO Primary Gastroenterologist:  Dr. Oneida Alar  Reason for Consultation:    Melena and anemia.  HPI:   Patient is 85 year old African-American male who is on clopidogrel and rivaroxaban who was evaluated by Dr. Oneida Alar about 2 weeks ago for melena and anemia.  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  These lesions were ablated with APC. Patient resume his antiplatelet and anticoagulant few days after the procedure. Patient noted to mid and tarry stools over the last 1 week.  He was seen in emergency room last evening.  His stool was guaiac negative.  His hemoglobin was 8.3 g and 3 hours later dropped to 7.8 g.  Since patient is on dual therapy it was felt that he needs to be hospitalized for further evaluation.  Patient was therefore transferred to [facility] early this morning.  His H&H this morning was 8.1 g.  He has not had a bowel movement since he has been at this facility.  He denies abdominal pain or bright red blood per rectum.  He also denies heartburn nausea vomiting anorexia or weight loss.  He has a history of colon carcinoma.  He had surgery about 20 years ago.  He reports colonoscopy in 2018 or 2019 was unremarkable.  He is prone to constipation and may use OTC laxative on as-needed basis.  He had been on Voltaren in the past but this was stopped discontinued several weeks ago.  Similarly aspirin was stopped. Last rivaroxaban dose was 2 days ago.  Patient is retired.  Married.  He and his wife live in [place]. He used to smoke cigarettes but quit several years ago.  He would then smoke 1 cigar daily which she quit in December 2018.  Drinks alcohol occasionally.  Family history significant for gastric carcinoma in his sister who was 60 years old when she was diagnosed and died within few weeks.  2 brothers are disease.  1 of them had some type of  malignancy.  2 sisters are living.    Past Medical History:  Diagnosis Date  . Anginal pain ([place])   . Atrial fibrillation ([place])   . CAD (coronary artery disease)   . Cancer of sigmoid colon ([place])    approx 1990  . DM type 2 (diabetes mellitus, type 2) ([place])   . GERD (gastroesophageal reflux disease)   . Gout   . Hiatal hernia   . Hyperlipemia   . Hypertension   . Incontinence     Past Surgical History:  Procedure Laterality Date  . BIOPSY  03/09/2019   Procedure: BIOPSY;  Surgeon: Danie Binder, MD;  [facility];  Service: Endoscopy;;  . cataracts    . CHOLECYSTECTOMY    . COLONOSCOPY     STOPPED BREATHING  . CORONARY STENT INTERVENTION  12/06/2018  . CORONARY STENT INTERVENTION N/A 12/06/2018   Procedure: CORONARY STENT INTERVENTION;  Surgeon: Jettie Booze, MD;  Location: [place] [facility];  Service: Cardiovascular;  Laterality: N/A;  . ESOPHAGOGASTRODUODENOSCOPY N/A 03/09/2019   Procedure: ESOPHAGOGASTRODUODENOSCOPY (EGD);  Surgeon: Danie Binder, MD;  Location[facility];  Service: Endoscopy;  Laterality: N/A;  10:30am - pt knows to arrive at 10:15  . MINOR CARPAL TUNNEL    . REPLACEMENT TOTAL KNEE    . RIGHT/LEFT HEART CATH AND CORONARY ANGIOGRAPHY N/A 12/06/2018   Procedure: RIGHT/LEFT HEART CATH AND CORONARY ANGIOGRAPHY;  Surgeon: Jettie Booze, MD;  Location: [place] [facility];  Service: Cardiovascular;  Laterality: N/A;    Prior to Admission medications   Medication Sig Start Date End Date Taking? Authorizing Provider  albuterol (PROVENTIL HFA;VENTOLIN HFA) 108 (90 Base) MCG/ACT inhaler Inhale 1 puff into the lungs every 6 (six) hours as needed for wheezing or shortness of breath. 01/23/19   Arnoldo Lenis, MD  amLODipine (NORVASC) 5 MG tablet TAKE 1 TABLET EVERY DAY Patient taking differently: Take 5 mg by mouth daily.  08/21/18   Herminio Commons, MD  atorvastatin (LIPITOR) 40 MG tablet TAKE 1 AND 1/2 TABLETS EVERY  DAY Patient taking differently: Take 60 mg by mouth daily.  12/05/18   Herminio Commons, MD  clopidogrel (PLAVIX) 75 MG tablet Take 1 tablet (75 mg total) by mouth daily with breakfast. 12/25/18   Kilroy, Doreene Burke, PA-C  docusate sodium (COLACE) 100 MG capsule Take 100 mg by mouth daily.     [provider]  hydrALAZINE (APRESOLINE) 100 MG tablet TAKE 1 TABLET THREE TIMES DAILY ( DOSE INCREASE ) Patient taking differently: Take 100 mg by mouth 3 (three) times daily.  07/31/18   Herminio Commons, MD  hydrochlorothiazide (HYDRODIURIL) 12.5 MG tablet Take 12.5 mg by mouth daily with breakfast.     [provider]  isosorbide mononitrate (IMDUR) 60 MG 24 hr tablet TAKE 1 TABLET EVERY DAY Patient taking differently: Take 60 mg by mouth daily. TAKE 1 TABLET EVERY DAY 12/11/18   Herminio Commons, MD  losartan (COZAAR) 100 MG tablet Take 100 mg by mouth daily.    [provider]  magnesium hydroxide (MILK OF MAGNESIA) 400 MG/5ML suspension Take 15 mLs by mouth daily as needed for mild constipation.    [provider]  Multiple Vitamin (MULTIVITAMIN WITH MINERALS) TABS tablet Take 1 tablet by mouth daily. Centrum Silver    [provider]  nitroGLYCERIN (NITROSTAT) 0.4 MG SL tablet DISSOLVE 1 TABLET UNDER THE TONGUE EVERY 5 MINUTES AS NEEDED FOR CHEST PAIN 12/07/18   Sande Rives E, PA-C  Omega-3 Fatty Acids (OMEGA-3 FISH OIL) 1200 MG CAPS Take 1,200 mg by mouth daily.     [provider]  pantoprazole (PROTONIX) 40 MG tablet Take 1 tablet (40 mg total) by mouth daily. 03/09/19 06/07/19  Danie Binder, MD  Polyethyl Glycol-Propyl Glycol (SYSTANE ULTRA OP) Place 1 drop into both eyes daily as needed (dry eyes).    [provider]  rivaroxaban (XARELTO) 20 MG TABS tablet Take 1 tablet (20 mg total) by mouth daily with supper. 12/25/18   Erlene Quan, PA-C  vitamin B-12 (CYANOCOBALAMIN) 1000 MCG tablet Take 1,000 mcg by mouth every Sunday.     [provider]    Current Facility-Administered Medications  Medication Dose Route Frequency Provider Last Rate Last Dose  . acetaminophen (TYLENOL) tablet 650 mg  650 mg Oral Q6H PRN Oswald Hillock, MD       Or  . acetaminophen (TYLENOL) suppository 650 mg  650 mg Rectal Q6H PRN Oswald Hillock, MD      . amLODipine (NORVASC) tablet 5 mg  5 mg Oral Daily [place], Gagan S, MD      . atorvastatin (LIPITOR) tablet 60 mg  60 mg Oral Daily Darrick Meigs, Marge Duncans, MD      . clopidogrel (PLAVIX) tablet 75 mg  75 mg Oral Q breakfast Oswald Hillock, MD      . docusate sodium (  COLACE) capsule 100 mg  100 mg Oral Daily [place], Marge Duncans, MD      . hydrALAZINE (APRESOLINE) tablet 100 mg  100 mg Oral TID Oswald Hillock, MD      . hydrALAZINE (APRESOLINE) tablet 25 mg  25 mg Oral Q6H PRN Oswald Hillock, MD   25 mg at 03/21/19 0138  . hydrochlorothiazide (HYDRODIURIL) tablet 12.5 mg  12.5 mg Oral Q breakfast [place], Marge Duncans, MD      . isosorbide mononitrate (IMDUR) 24 hr tablet 60 mg  60 mg Oral Daily Darrick Meigs, Marge Duncans, MD      . losartan (COZAAR) tablet 100 mg  100 mg Oral Daily Darrick Meigs, Marge Duncans, MD      . ondansetron [facility]) tablet 4 mg  4 mg Oral Q6H PRN Oswald Hillock, MD       Or  . ondansetron (ZOFRAN) injection 4 mg  4 mg Intravenous Q6H PRN Oswald Hillock, MD      . pantoprazole (PROTONIX) EC tablet 40 mg  40 mg Oral Daily Oswald Hillock, MD        Allergies as of 03/20/2019 - Review Complete 03/09/2019  Allergen Reaction Noted  . Morphine and related Swelling 04/12/2013    Family History  Problem Relation Age of Onset  . Heart disease Mother   . Colon cancer Brother   . Stomach cancer Sister   . Lupus Sister   . Heart disease Sister   . Diabetes Sister   . Hypertension Sister     Social History   Socioeconomic History  . Marital status: Married    Spouse name: Not on file  . Number of children: Not on file  . Years of education: Not on file  . Highest education level: Not on file  Occupational  History  . Not on file  Social Needs  . Financial resource strain: Not on file  . Food insecurity:    Worry: Not on file    Inability: Not on file  . Transportation needs:    Medical: Not on file    Non-medical: Not on file  Tobacco Use  . Smoking status: Former Smoker    Packs/day: 1.00    Years: 20.00    Pack years: 20.00    Types: Cigars    Start date: 07/18/1993    Last attempt to quit: 10/02/2017    Years since quitting: 1.4  . Smokeless tobacco: Never Used  Substance and Sexual Activity  . Alcohol use: Yes    Alcohol/week: 0.0 standard drinks    Comment: Socially  . Drug use: Never  . Sexual activity: Not on file  Lifestyle  . Physical activity:    Days per week: Not on file    Minutes per session: Not on file  . Stress: Not on file  Relationships  . Social connections:    Talks on phone: Not on file    Gets together: Not on file    Attends religious service: Not on file    Active member of club or organization: Not on file    Attends meetings of clubs or organizations: Not on file    Relationship status: Not on file  . Intimate partner violence:    Fear of current or ex partner: Not on file    Emotionally abused: Not on file    Physically abused: Not on file    Forced sexual activity: Not on file  Other Topics Concern  . 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 [place] SEP 2020: 4 BIO, 1 STEP.    Review of Systems: See HPI, otherwise normal ROS  Physical Exam: Temp:  [98.3 F (36.8 C)-99 F (37.2 C)] 98.6 F (37 C) (06/03 1420) Pulse Rate:  [49-80] 66 (06/03 1420) Resp:  [20] 20 (06/03 1420) BP: (141-170)/(67-86) 161/73 (06/03 1420) SpO2:  [98 %-99 %] 98 % (06/03 1420) Weight:  [98 kg] 98 kg (06/03 0115) Last BM Date: 03/20/19  Patient is alert and in no acute distress. He appears younger than stated age. Conjunctivae is pale.  Sclera is nonicteric. Oropharyngeal mucosa is  unremarkable. He has upper dental plate and his own teeth and lower jaw. No thyromegaly or lymphadenopathy noted. Cardiac exam with irregular rhythm normal S1 and S2.  No murmur or gallop noted. Lungs are clear to auscultation. Abdomen is full.  Bowel sounds are normal.  He has midline vertical scar and scar in left lower quadrant site of prior colostomy. He has trace edema around ankles.  He does not have clubbing or koilonychia.    Lab Results: Recent Labs    03/21/19 0145 03/21/19 0724  WBC 4.1 4.4  HGB 8.3* 8.1*  HCT 26.7* 26.2*  PLT 255 280   BMET Recent Labs    03/21/19 0145 03/21/19 0754  NA 135 133*  K 3.9 3.8  CL 104 101  CO2 22 23  GLUCOSE 94 99  BUN 14 13  CREATININE 1.15 1.17  CALCIUM 8.7* 8.8*   LFT Recent Labs    03/21/19 0754  PROT 6.6  ALBUMIN 3.4*  AST 19  ALT 16  ALKPHOS 61  BILITOT 0.5    Assessment;  Patient is an 85 year old African-American male with history of coronary artery disease status post DES and angioplasty on 12/06/2018 as well as atrial fibrillation on clopidogrel and rivaroxaban who was evaluated for GI bleed and melena about 2 weeks ago and 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  These ablated with APC by Dr. Oneida Alar. Patient now returns with history of melena for 1 week.  Hemoglobin is low but has not significantly dropped over the last 20 hours or so. He could be bleeding from gastric AV malformations are more likely from small bowel AV malformations not within the reach of EGD.  He has a history of colon carcinoma and is up-to-date on surveillance schedule.  Last colonoscopy was less than 2 years ago.   Recommendations;  Esophagogastroduodenoscopy with therapeutic intention. EGD is negative we will proceed with small bowel given capsule study. Procedure and risks reviewed with patient and is agreeable.   LOS: 0 days   Najeeb Rehman  03/21/2019, 4:25 PM

## 2019-03-21 NOTE — H&P (Addendum)
TRH H&P    Patient Demographics:    Antonio Watkins, is a 85 y.o. male  MRN: [id number]  DOB - 11/22/1929  Admit Date - 03/21/2019    Outpatient Primary MD for the patient is Elvina Mattes Claudette Laws, DO  Patient coming from: [facility] [place]  Chief complaint-black stool   HPI:    Antonio Watkins  is a 85 y.o. male, with history of CAD status post PCI with DES to circumflex,RCA on Plavix, persistent atrial fibrillation on anticoagulants on Xarelto, hypertension, hyperlipidemia who underwent EGD 2 weeks ago on 03/09/2019 for anemia which showed gastritis and AVMs, status post coagulation of 1 of the 4 angiectasia's for bleeding.    Patient went to [facility] rocking him today as he noticed black-colored stool yesterday.  Stool guaiac was negative.  Initially hemoglobin at [facility] was 8.3 which dropped to 7.8 so [facility].  Dr. Laural Golden recommended patient to be transferred here for evaluation by GI in a.m. Lab work done at [facility] shows hemoglobin of 8.3 today.  Denies nausea vomiting or diarrhea.  No vomiting of blood. Denies chest pain or shortness of breath Denies abdominal pain or dysuria. No fever or chills. SARS-CoV-2 test is negative.    Review of systems:    In addition to the HPI above,   All other systems reviewed and are negative.    Past History of the following :    Past Medical History:  Diagnosis Date  . Anginal pain ([place])   . Atrial fibrillation ([place])   . CAD (coronary artery disease)   . Cancer of sigmoid colon ([place])    approx 1990  . DM type 2 (diabetes mellitus, type 2) ([place])   . GERD (gastroesophageal reflux disease)   . Gout   . Hiatal hernia   . Hyperlipemia   . Hypertension   . Incontinence       Past Surgical History:  Procedure Laterality Date  . BIOPSY  03/09/2019   Procedure: BIOPSY;  Surgeon: Danie Binder, MD;   [facility];  Service: Endoscopy;;  . cataracts    . CHOLECYSTECTOMY    . COLONOSCOPY     STOPPED BREATHING  . CORONARY STENT INTERVENTION  12/06/2018  . CORONARY STENT INTERVENTION N/A 12/06/2018   Procedure: CORONARY STENT INTERVENTION;  Surgeon: Jettie Booze, MD;  Location: [place] CV LAB;  Service: Cardiovascular;  Laterality: N/A;  . ESOPHAGOGASTRODUODENOSCOPY N/A 03/09/2019   Procedure: ESOPHAGOGASTRODUODENOSCOPY (EGD);  Surgeon: Danie Binder, MD;  [facility];  Service: Endoscopy;  Laterality: N/A;  10:30am - pt knows to arrive at 10:15  . MINOR CARPAL TUNNEL    . REPLACEMENT TOTAL KNEE    . RIGHT/LEFT HEART CATH AND CORONARY ANGIOGRAPHY N/A 12/06/2018   Procedure: RIGHT/LEFT HEART CATH AND CORONARY ANGIOGRAPHY;  Surgeon: Jettie Booze, MD;  Location: [place] CV LAB;  Service: Cardiovascular;  Laterality: N/A;      Social History:      Social History   Tobacco  Use  . Smoking status: Former Smoker    Packs/day: 1.00    Years: 20.00    Pack years: 20.00    Types: Cigars    Start date: 07/18/1993    Last attempt to quit: 10/02/2017    Years since quitting: 1.4  . Smokeless tobacco: Never Used  Substance Use Topics  . Alcohol use: Yes    Alcohol/week: 0.0 standard drinks    Comment: Socially       Family History :     Family History  Problem Relation Age of Onset  . Heart disease Mother   . Colon cancer Brother   . Stomach cancer Sister   . Lupus Sister   . Heart disease Sister   . Diabetes Sister   . Hypertension Sister       Home Medications:   Prior to Admission medications   Medication Sig Start Date End Date Taking? Authorizing Provider  albuterol (PROVENTIL HFA;VENTOLIN HFA) 108 (90 Base) MCG/ACT inhaler Inhale 1 puff into the lungs every 6 (six) hours as needed for wheezing or shortness of breath. 01/23/19   Arnoldo Lenis, MD  amLODipine (NORVASC) 5 MG tablet TAKE 1 TABLET EVERY DAY Patient taking  differently: Take 5 mg by mouth daily.  08/21/18   Herminio Commons, MD  atorvastatin (LIPITOR) 40 MG tablet TAKE 1 AND 1/2 TABLETS EVERY DAY Patient taking differently: Take 60 mg by mouth daily.  12/05/18   Herminio Commons, MD  clopidogrel (PLAVIX) 75 MG tablet Take 1 tablet (75 mg total) by mouth daily with breakfast. 12/25/18   Kilroy, Doreene Burke, PA-C  docusate sodium (COLACE) 100 MG capsule Take 100 mg by mouth daily.     [provider]  hydrALAZINE (APRESOLINE) 100 MG tablet TAKE 1 TABLET THREE TIMES DAILY ( DOSE INCREASE ) Patient taking differently: Take 100 mg by mouth 3 (three) times daily.  07/31/18   Herminio Commons, MD  hydrochlorothiazide (HYDRODIURIL) 12.5 MG tablet Take 12.5 mg by mouth daily with breakfast.     [provider]  isosorbide mononitrate (IMDUR) 60 MG 24 hr tablet TAKE 1 TABLET EVERY DAY Patient taking differently: Take 60 mg by mouth daily. TAKE 1 TABLET EVERY DAY 12/11/18   Herminio Commons, MD  losartan (COZAAR) 100 MG tablet Take 100 mg by mouth daily.    [provider]  magnesium hydroxide (MILK OF MAGNESIA) 400 MG/5ML suspension Take 15 mLs by mouth daily as needed for mild constipation.    [provider]  Multiple Vitamin (MULTIVITAMIN WITH MINERALS) TABS tablet Take 1 tablet by mouth daily. Centrum Silver    [provider]  nitroGLYCERIN (NITROSTAT) 0.4 MG SL tablet DISSOLVE 1 TABLET UNDER THE TONGUE EVERY 5 MINUTES AS NEEDED FOR CHEST PAIN 12/07/18   Sande Rives E, PA-C  Omega-3 Fatty Acids (OMEGA-3 FISH OIL) 1200 MG CAPS Take 1,200 mg by mouth daily.     [provider]  pantoprazole (PROTONIX) 40 MG tablet Take 1 tablet (40 mg total) by mouth daily. 03/09/19 06/07/19  Danie Binder, MD  Polyethyl Glycol-Propyl Glycol (SYSTANE ULTRA OP) Place 1 drop into both eyes daily as needed (dry eyes).    [provider]  rivaroxaban (XARELTO) 20 MG TABS tablet Take 1 tablet (20 mg total) by  mouth daily with supper. 12/25/18   Erlene Quan, PA-C  vitamin B-12 (CYANOCOBALAMIN) 1000 MCG tablet Take 1,000 mcg by mouth every Sunday.    [provider]  Allergies:     Allergies  Allergen Reactions  . Morphine And Related Swelling     Physical Exam:   Vitals  Blood pressure (!) 170/86, pulse 80, temperature 99 F (37.2 C), temperature source Oral, resp. rate 20, height 5\' 10"  (1.778 m), weight 98 kg, SpO2 98 %.  1.  General: Appears in no acute distress  2. Psychiatric: Alert, oriented x3, intact insight and judgment  3. Neurologic: Cranial nerves II through grossly intact, motor strength 5/5 in all extremities  4. HEENMT:  Atraumatic normocephalic, extraocular muscles intact  5. Respiratory : Clear to auscultation bilaterally  6. Cardiovascular : S1-S2, regular, no murmur auscultated  7. Gastrointestinal:  Abdomen is soft, nontender, no organomegaly     Data Review:    CBC Recent Labs  Lab 03/21/19 0145  WBC 4.1  HGB 8.3*  HCT 26.7*  PLT 255  MCV 82.4  MCH 25.6*  MCHC 31.1  RDW 16.2*   ------------------------------------------------------------------------------------------------------------------  Results for orders placed or performed during the hospital encounter of 03/21/19 (from the past 48 hour(s))  SARS Coronavirus 2 (CEPHEID - Performed in [place] hospital lab), Hosp Order     Status: None   Collection Time: 03/21/19  1:14 AM  Result Value Ref Range   SARS Coronavirus 2 NEGATIVE NEGATIVE    Comment: (NOTE) If result is NEGATIVE SARS-CoV-2 target nucleic acids are NOT DETECTED. The SARS-CoV-2 RNA is generally detectable in upper and lower  respiratory specimens during the acute phase of infection. The lowest  concentration of SARS-CoV-2 viral copies this assay can detect is 250  copies / mL. A negative result does not preclude SARS-CoV-2 infection  and should not be used as the sole basis for treatment or other   patient management decisions.  A negative result may occur with  improper specimen collection / handling, submission of specimen other  than nasopharyngeal swab, presence of viral mutation(s) within the  areas targeted by this assay, and inadequate number of viral copies  (<250 copies / mL). A negative result must be combined with clinical  observations, patient history, and epidemiological information. If result is POSITIVE SARS-CoV-2 target nucleic acids are DETECTED. The SARS-CoV-2 RNA is generally detectable in upper and lower  respiratory specimens dur ing the acute phase of infection.  Positive  results are indicative of active infection with SARS-CoV-2.  Clinical  correlation with patient history and other diagnostic information is  necessary to determine patient infection status.  Positive results do  not rule out bacterial infection or co-infection with other viruses. If result is PRESUMPTIVE POSTIVE SARS-CoV-2 nucleic acids MAY BE PRESENT.   A presumptive positive result was obtained on the submitted specimen  and confirmed on repeat testing.  While 2019 novel coronavirus  (SARS-CoV-2) nucleic acids may be present in the submitted sample  additional confirmatory testing may be necessary for epidemiological  and / or clinical management purposes  to differentiate between  SARS-CoV-2 and other Sarbecovirus currently known to infect humans.  If clinically indicated additional testing with an alternate test  methodology [phone]) is advised. The SARS-CoV-2 RNA is generally  detectable in upper and lower respiratory sp ecimens during the acute  phase of infection. The expected result is Negative. Fact Sheet for Patients:  StrictlyIdeas.no Fact Sheet for Healthcare Providers: BankingDealers.co.za This test is not yet approved or cleared by the [place] FDA and has been authorized for detection and/or diagnosis of SARS-CoV-2 by  FDA under an Emergency Use Authorization (EUA).  This  EUA will remain in effect (meaning this test can be used) for the duration of the COVID-19 declaration under Section 564(b)(1) of the Act, 21 U.S.C. section 360bbb-3(b)(1), unless the authorization is terminated or revoked sooner. Performed at [facility], [address], [place], [place] [address]   CBC     Status: Abnormal   Collection Time: 03/21/19  1:45 AM  Result Value Ref Range   WBC 4.1 4.0 - 10.5 K/uL   RBC 3.24 (L) 4.22 - 5.81 MIL/uL   Hemoglobin 8.3 (L) 13.0 - 17.0 g/dL   HCT 26.7 (L) 39.0 - 52.0 %   MCV 82.4 80.0 - 100.0 fL   MCH 25.6 (L) 26.0 - 34.0 pg   MCHC 31.1 30.0 - 36.0 g/dL   RDW 16.2 (H) 11.5 - 15.5 %   Platelets 255 150 - 400 K/uL   nRBC 0.0 0.0 - 0.2 %    Comment: Performed at [facility], [address], [place], [place] [address]  Basic metabolic panel     Status: Abnormal   Collection Time: 03/21/19  1:45 AM  Result Value Ref Range   Sodium 135 135 - 145 mmol/L   Potassium 3.9 3.5 - 5.1 mmol/L   Chloride 104 98 - 111 mmol/L   CO2 22 22 - 32 mmol/L   Glucose, Bld 94 70 - 99 mg/dL   BUN 14 8 - 23 mg/dL   Creatinine, Ser 1.15 0.61 - 1.24 mg/dL   Calcium 8.7 (L) 8.9 - 10.3 mg/dL   GFR calc non Af Amer 57 (L) >60 mL/min   GFR calc Af Amer >60 >60 mL/min   Anion gap 9 5 - 15    Comment: Performed at [facility], [address], [place], [place] [address]    Chemistries  Recent Labs  Lab 03/21/19 0145  NA 135  K 3.9  CL 104  CO2 22  GLUCOSE 94  BUN 14  CREATININE 1.15  CALCIUM 8.7*   ------------------------------------------------------------------------------------------------------------------  ------------------------------------------------------------------------------------------------------------------ GFR: Estimated Creatinine Clearance: 54.1 mL/min (by C-G formula based on SCr of 1.15 mg/dL). Liver Function Tests: No results for input(s): AST, ALT, ALKPHOS, BILITOT, PROT,  ALBUMIN in the last 168 hours. No results for input(s): LIPASE, AMYLASE in the last 168 hours. No results for input(s): AMMONIA in the last 168 hours. Coagulation Profile: No results for input(s): INR, PROTIME in the last 168 hours. Cardiac Enzymes: No results for input(s): CKTOTAL, CKMB, CKMBINDEX, TROPONINI in the last 168 hours. BNP (last 3 results) No results for input(s): PROBNP in the last 8760 hours. HbA1C: No results for input(s): HGBA1C in the last 72 hours. CBG: No results for input(s): GLUCAP in the last 168 hours. Lipid Profile: No results for input(s): CHOL, HDL, LDLCALC, TRIG, CHOLHDL, LDLDIRECT in the last 72 hours. Thyroid Function Tests: No results for input(s): TSH, T4TOTAL, FREET4, T3FREE, THYROIDAB in the last 72 hours. Anemia Panel: No results for input(s): VITAMINB12, FOLATE, FERRITIN, TIBC, IRON, RETICCTPCT in the last 72 hours.   EKG- atrial fibrillation --------------------------------------------------------------------------------------------------------------- Urine analysis: No results found for: COLORURINE, APPEARANCEUR, LABSPEC, PHURINE, GLUCOSEU, HGBUR, BILIRUBINUR, KETONESUR, PROTEINUR, UROBILINOGEN, NITRITE, LEUKOCYTESUR    Imaging Results:     Assessment & Plan:    Active Problems:   CAD- S/P PCI   Essential hypertension   Atrial fibrillation, persistent   Melena   1. Melena-patient had one episode of black stool yesterday, stool guaiac was negative at [facility].  Hemoglobin stable at 8.3.  Will consult gastroenterology in a.m. for  further recommendations.  I will hold Xarelto at this time.  2. Atrial fibrillation persistent-patient is on Xarelto for anticoagulation which is currently on hold due to melena as above.  Consider restarting Xarelto after consulting with GI.  3. CAD status post PCI-continue Plavix, Lipitor, Imdur.  4. Hypertension-blood pressure stable, continue amlodipine, hydralazine, hydrochlorothiazide, Cozaar     DVT Prophylaxis-   SCDs  AM Labs Ordered, also please review Full Orders  Family Communication: Admission, patients condition and plan of care including tests being ordered have been discussed with the patient  who indicate understanding and agree with the plan and Code Status.  Code Status: Full code  Admission status: Observation/Inpatient: Based on patients clinical presentation and evaluation of above clinical data, I have made determination that patient will need less than 2 midnight stay in the hospital.   Time spent in minutes : 60 minutes   Oswald Hillock M.D on 03/21/2019 at 4:44 AM

## 2019-03-21 NOTE — Telephone Encounter (Signed)
PLEASE CALL PT. HE CAN KEEP APPT FOR JUL 2020 AND SCHEDULE FOR OCT 2020. IF HE FEELS WELL HE CAN CANCEL JUL 2020 APPT AND I'LL SEE HIM IN OCT.

## 2019-03-21 NOTE — Progress Notes (Signed)
Patient was admitted early this morning after midnight and H&P has been reviewed and I am in current agreement with assessment and plan done by Dr. Eleonore Chiquito.  Additional change of the plan of care been made accordingly.  The patient is a 85-year-old African-American male with a past medical history significant for but not limited to CAD status post PCI with DES to the circumflex and RCA currently on Plavix, persistent atrial fibrillation on anticoagulants with Xarelto, hypertension, hyperlipidemia, history of gout, history of diabetes mellitus type 2, history of sigmoid colon cancer who presented with a chief complaint of intermittent dark black stools.  The patient recently underwent an EGD 2 weeks ago on 03/09/2019 for an anemia work-up and that showed gastritis and AVMs along with angiectasia's.  She went to [facility] rocking him yesterday after he noticed black-colored stool and the stool was guaiac negative there.  Initially his hemoglobin interlocking and was 8.3 and a drop to 7.8 and has transferred any pain hospital for further GI evaluation.  Dr. Laural Golden of Gastroenterology is to evaluate later today.  Of note patient was SARS-CoV-2 negative.  Patient is admitted for the following and being treated for:  Melena Normocytic Anemia -Patient had one episode of black stool yesterday, stool guaiac was negative at [facility].   -Hemoglobin stable at 8.3 on admission is now currently 8.1 with a hematocrit of 26.2 -We will check anemia panel -Consulted Gastroenterology in a.m. for further recommendations.  -Continue to hold Xarelto at this time and continue to monitor for signs and symptoms of bleeding -Repeat CBC in a.m.  Atrial Fibrillation, persistent -Patient is on Xarelto for anticoagulation which is currently on hold due to melena as above.   -Will Consider restarting Xarelto after consulting with GI and ok with GI Clearance .  CAD status post PCI -Hold Plavix at this time -C/wLipitor,  Imdur.  Hypertension -Blood pressure stable last blood pressure was 141/67 -Continue Amlodipine, Hydralazine, Hydrochlorothiazide, Cozaar when patient is no longer n.p.o.  Hyponatremia -Mild at 133 -Continue monitor and trend and repeat CMP in a.m.  GERD -Continue with home Protonix  Diabetes Mellitus Type 2 -No hemoglobin A1c on file and will check in the a.m. -We will continue to monitor patient's blood sugars carefully -Blood sugar on daily BMPs/CMP is have been ranging from 94-99  Obesity -Estimated body mass index is 31 kg/m as calculated from the following:   Height as of this encounter: 5\' 10"  (1.778 m).   Weight as of this encounter: 98 kg. -Weight Loss and Dietary Counseling given   We will continue to monitor pressures clinical response to intervention and repeat blood work in the a.m.

## 2019-03-21 NOTE — Op Note (Signed)
[facility] Patient Name: Antonio Watkins Procedure Date: 03/21/2019 4:48 PM MRN: [id number] Date of Birth: [birth year] Attending MD: Hildred Laser , MD CSN: [id number] Age: 85 Admit Type: Inpatient Procedure:                Upper GI endoscopy Indications:              Melena Providers:                Hildred Laser, MD, Hinton Rao, RN, Lurline Del,                            RN, Janeece Riggers, RN Referring MD:             Kerney Elbe, DO Medicines:                Lidocaine spray, Meperidine 50 mg IV, Midazolam 3                            mg IV Complications:            No immediate complications. Estimated Blood Loss:     Estimated blood loss: none. Procedure:                Pre-Anesthesia Assessment:                           - Prior to the procedure, a History and Physical                            was performed, and patient medications and                            allergies were reviewed. The patient's tolerance of                            previous anesthesia was also reviewed. The risks                            and benefits of the procedure and the sedation                            options and risks were discussed with the patient.                            All questions were answered, and informed consent                            was obtained. Prior Anticoagulants: The patient                            last took Plavix (clopidogrel) 1 day and Xarelto                            (rivaroxaban) 2 days prior to the procedure. ASA  Grade Assessment: III - A patient with severe                            systemic disease. After reviewing the risks and                            benefits, the patient was deemed in satisfactory                            condition to undergo the procedure.                           After obtaining informed consent, the endoscope was                            passed under direct vision. Throughout the                           procedure, the patient's blood pressure, pulse, and                            oxygen saturations were monitored continuously. The                            [id number]-[id number] ([id number]) scope was introduced through the                            mouth, and advanced to the second part of duodenum.                            The upper GI endoscopy was accomplished without                            difficulty. The patient tolerated the procedure                            well. Scope In: 5:08:54 PM Scope Out: 5:14:53 PM Total Procedure Duration: 0 hours 5 minutes 59 seconds  Findings:      The examined esophagus was normal.      One non-bleeding superficial gastric ulcer was found in the gastric       fundus. The lesion was 5 mm in largest dimension.      One non-bleeding superficial gastric ulcer with adherent clot was found       in the gastric body. The lesion was 3 mm in largest dimension.      Two non-bleeding superficial gastric ulcers with no stigmata of bleeding       were found in the gastric antrum. The largest lesion was 3 mm in largest       dimension.      The exam of the stomach was otherwise normal.      The duodenal bulb and second portion of the duodenum were normal. Impression:               - Normal esophagus.                           -  Non-bleeding superficial gastric ulcer at fundus                            resulting from [facility] ablation of AVM.                           - Non-bleeding gastric ulcer with adherent clot at                            gastric body resulting from [facility] ablation of AVM.                           - Non-bleeding gastric ulcers with no stigmata of                            bleeding resulting from [facility] ablation of AVMs.                           - Normal duodenal bulb and second portion of the                            duodenum.                           - No specimens collected.                           Comment: no active  bleeding noted or induced on                            washing with water jet.                           he may or may not have bled from these sites. Moderate Sedation:      Moderate (conscious) sedation was administered by the endoscopy nurse       and supervised by the endoscopist. The following parameters were       monitored: oxygen saturation, heart rate, blood pressure, CO2       capnography and response to care. Total physician intraservice time was       13 minutes. Recommendation:           - Return patient to hospital ward for ongoing care.                           - Clear liquid diet today.                           - Continue present medications.                           - To visualize the small bowel, perform video                            capsule endoscopy tomorrow. Procedure Code(s):        --- Professional ---  25638, Esophagogastroduodenoscopy, flexible,                            transoral; diagnostic, including collection of                            specimen(s) by brushing or washing, when performed                            (separate procedure)                           G0500, Moderate sedation services provided by the                            same physician or other qualified health care                            professional performing a gastrointestinal                            endoscopic service that sedation supports,                            requiring the presence of an independent trained                            observer to assist in the monitoring of the                            patient's level of consciousness and physiological                            status; initial 15 minutes of intra-service time;                            patient age 59 years or older (additional time may                            be reported with [id number], as appropriate) Diagnosis Code(s):        --- Professional ---                            K25.4, Chronic or unspecified gastric ulcer with                            hemorrhage                           K25.9, Gastric ulcer, unspecified as acute or                            chronic, without hemorrhage or perforation                           K92.1, Melena (  includes Hematochezia) CPT copyright 2019 American Medical Association. All rights reserved. The codes documented in this report are preliminary and upon coder review may  be revised to meet current compliance requirements. Hildred Laser, MD Hildred Laser, MD 03/21/2019 5:59:40 PM This report has been signed electronically. Number of Addenda: 0

## 2019-03-21 NOTE — Progress Notes (Signed)
Notified k schorr via [employer] system of 2.95 second pause telemetry strip

## 2019-03-21 NOTE — Care Management Obs Status (Signed)
[place] NOTIFICATION   Patient Details  Name: Antonio Watkins MRN: [id number] Date of Birth: 12/15/1929   Medicare Observation Status Notification Given:  Yes    Tommy Medal 03/21/2019, 2:34 PM

## 2019-03-22 ENCOUNTER — Telehealth: Payer: Self-pay

## 2019-03-22 ENCOUNTER — Telehealth: Payer: Self-pay | Admitting: Gastroenterology

## 2019-03-22 ENCOUNTER — Encounter (HOSPITAL_COMMUNITY)
Admission: AD | Disposition: A | Discharge status: Home / Self Care | Payer: Self-pay | Source: Other Acute Inpatient Hospital | Attending: Internal Medicine

## 2019-03-22 DIAGNOSIS — I10 Essential (primary) hypertension: Secondary | ICD-10-CM | POA: Diagnosis not present

## 2019-03-22 DIAGNOSIS — I4819 Other persistent atrial fibrillation: Secondary | ICD-10-CM | POA: Diagnosis not present

## 2019-03-22 DIAGNOSIS — M795 Residual foreign body in soft tissue: Secondary | ICD-10-CM

## 2019-03-22 DIAGNOSIS — K921 Melena: Secondary | ICD-10-CM | POA: Diagnosis not present

## 2019-03-22 DIAGNOSIS — I2510 Atherosclerotic heart disease of native coronary artery without angina pectoris: Secondary | ICD-10-CM | POA: Diagnosis not present

## 2019-03-22 DIAGNOSIS — D649 Anemia, unspecified: Secondary | ICD-10-CM

## 2019-03-22 HISTORY — PX: GIVENS CAPSULE STUDY: SHX5432

## 2019-03-22 LAB — COMPREHENSIVE METABOLIC PANEL
ALT: 15 U/L (ref 0–44)
AST: 19 U/L (ref 15–41)
Albumin: 3.4 g/dL — ABNORMAL LOW (ref 3.5–5.0)
Alkaline Phosphatase: 49 U/L (ref 38–126)
Anion gap: 8 (ref 5–15)
BUN: 11 mg/dL (ref 8–23)
CO2: 24 mmol/L (ref 22–32)
Calcium: 8.8 mg/dL — ABNORMAL LOW (ref 8.9–10.3)
Chloride: 102 mmol/L (ref 98–111)
Creatinine, Ser: 1.07 mg/dL (ref 0.61–1.24)
GFR calc Af Amer: >60 mL/min (ref >60)
GFR calc non Af Amer: >60 mL/min (ref >60)
Glucose, Bld: 86 mg/dL (ref 70–99)
Potassium: 4.3 mmol/L (ref 3.5–5.1)
Sodium: 134 mmol/L — ABNORMAL LOW (ref 135–145)
Total Bilirubin: 0.6 mg/dL (ref 0.3–1.2)
Total Protein: 6.4 g/dL — ABNORMAL LOW (ref 6.5–8.1)

## 2019-03-22 LAB — IRON AND TIBC
Iron: 21 ug/dL — ABNORMAL LOW (ref 45–182)
Saturation Ratios: 6 % — ABNORMAL LOW (ref 17.9–39.5)
TIBC: 345 ug/dL (ref 250–450)
UIBC: 324 ug/dL

## 2019-03-22 LAB — HEMOGLOBIN A1C
Hgb A1c MFr Bld: 6.8 % — ABNORMAL HIGH (ref 4.8–5.6)
Mean Plasma Glucose: 148.46 mg/dL

## 2019-03-22 LAB — CBC WITH DIFFERENTIAL/PLATELET
Abs Immature Granulocytes: 0.01 10*3/uL (ref 0.00–0.07)
Basophils Absolute: 0 10*3/uL (ref 0.0–0.1)
Basophils Relative: 1 %
Eosinophils Absolute: 0.1 10*3/uL (ref 0.0–0.5)
Eosinophils Relative: 4 %
HCT: 26.1 % — ABNORMAL LOW (ref 39.0–52.0)
Hemoglobin: 8.1 g/dL — ABNORMAL LOW (ref 13.0–17.0)
Immature Granulocytes: 0 %
Lymphocytes Relative: 25 %
Lymphs Abs: 0.8 10*3/uL (ref 0.7–4.0)
MCH: 25.6 pg — ABNORMAL LOW (ref 26.0–34.0)
MCHC: 31 g/dL (ref 30.0–36.0)
MCV: 82.6 fL (ref 80.0–100.0)
Monocytes Absolute: 0.6 10*3/uL (ref 0.1–1.0)
Monocytes Relative: 18 %
Neutro Abs: 1.7 10*3/uL (ref 1.7–7.7)
Neutrophils Relative %: 52 %
Platelets: 271 10*3/uL (ref 150–400)
RBC: 3.16 MIL/uL — ABNORMAL LOW (ref 4.22–5.81)
RDW: 16.2 % — ABNORMAL HIGH (ref 11.5–15.5)
WBC: 3.3 10*3/uL — ABNORMAL LOW (ref 4.0–10.5)
nRBC: 0 % (ref 0.0–0.2)

## 2019-03-22 LAB — RETICULOCYTES
Immature Retic Fract: 25.4 % — ABNORMAL HIGH (ref 2.3–15.9)
RBC.: 3.16 MIL/uL — ABNORMAL LOW (ref 4.22–5.81)
Retic Count, Absolute: 53.1 10*3/uL (ref 19.0–186.0)
Retic Ct Pct: 1.7 % (ref 0.4–3.1)

## 2019-03-22 LAB — FOLATE: Folate: 28.8 ng/mL (ref >5.9)

## 2019-03-22 LAB — FERRITIN: Ferritin: 11 ng/mL — ABNORMAL LOW (ref 24–336)

## 2019-03-22 LAB — PHOSPHORUS: Phosphorus: 3.1 mg/dL (ref 2.5–4.6)

## 2019-03-22 LAB — VITAMIN B12: Vitamin B-12: 699 pg/mL (ref 180–914)

## 2019-03-22 LAB — MAGNESIUM: Magnesium: 1.8 mg/dL (ref 1.7–2.4)

## 2019-03-22 SURGERY — IMAGING PROCEDURE, GI TRACT, INTRALUMINAL, VIA CAPSULE

## 2019-03-22 MED ORDER — PANTOPRAZOLE SODIUM 40 MG PO TBEC: 40.0000 mg | DELAYED_RELEASE_TABLET | Freq: Two times a day (BID) | ORAL | 3 refills | Status: DC

## 2019-03-22 NOTE — Progress Notes (Signed)
Patient discharged home with instructions given on medications and follow up visits,patient verbalized understanding. Prescriptions sent to Pharmacy of choice documented on AVS. IV discontinued, catheter intact. Accompanied by staff to an awaiting vehicle. 

## 2019-03-22 NOTE — Telephone Encounter (Signed)
I left Vm for a return call.  I will find out which lab they need orders for.

## 2019-03-22 NOTE — Telephone Encounter (Signed)
Noted. Please let her know that he is being discharged on Xarelto BUT should stop Plavix. No Aspirin.   Consider talking with PCP regarding assistance with meds if needed. They could potentially do pill packs for them.

## 2019-03-22 NOTE — Telephone Encounter (Signed)
LMOM to call.

## 2019-03-22 NOTE — Discharge Summary (Signed)
Physician Discharge Summary  Olando Willems WIO:[id number] DOB: [birth year] DOA: 03/21/2019  PCP: Jacqualine Code, DO  Admit date: 03/21/2019 Discharge date: 03/22/2019  Admitted From: Home Disposition: Home  Recommendations for Outpatient Follow-up:  1. Follow up with PCP in 1-2 weeks 2. Follow up with Cardiology within 1 week 3. Follow up with Gastroenterology within 1 week 4. Please obtain CMP/CBC, Mag, Phos in one week 5. Please follow up on the following pending results:  Home Health: No Equipment/Devices: None    Discharge Condition: Stable  CODE STATUS: FULL CODE Diet recommendation: Heart Healthy Diet  Brief/Interim Summary: The patient is a 85-year-old African-American male with a past medical history significant for but not limited to CAD status post PCI with DES to the circumflex and RCA currently on Plavix, persistent atrial fibrillation on anticoagulants with Xarelto, hypertension, hyperlipidemia, history of gout, history of diabetes mellitus type 2, history of sigmoid colon cancer who presented with a chief complaint of intermittent dark black stools.  The patient recently underwent an EGD 2 weeks ago on 03/09/2019 for an anemia work-up and that showed gastritis and AVMs along with angiectasia's.  She went to [facility] rocking him yesterday after he noticed black-colored stool and the stool was guaiac negative there.  Initially his hemoglobin interlocking and was 8.3 and a drop to 7.8 and has transferred any pain hospital for further GI evaluation.  Dr. Laural Golden of Gastroenterology evaluated and patient underwent an EGD. Of note patient was SARS-CoV-2 negative.  EGD showed normal esophagus and and nonbleeding superficial gastric ulcer at the fundus resulting from [facility] ablation of the AVM.  3 nonbleeding gastric ulcer with adherent clot in the gastric body resulting from APC ablation of the AVM and nonbleeding gastric ulcers with no stigmata of bleeding resulting from APC ablation of the  AVMs and a normal duodenal bulb and second portion of the duodenum was seen and no specimens were collected.  GI then recommended a capsule endoscopy which was completed today.  Dr. Gala Romney evaluated the patient and deemed the patient stable for discharge at this time as his hemoglobin did not drop tremendously.  Dr. Gala Romney recommended continuing the patient's Xarelto and stopping his Plavix.  Patient was transitioned to twice daily PPI and deemed stable for discharge.  Will need to follow-up with gastroenterology within 1 to 2 weeks as well as cardiology as well as his primary care physician.  Discharge Diagnoses:  Active Problems:   CAD- S/P PCI   Essential hypertension   Atrial fibrillation, persistent   Melena  Melena stable and improved Normocytic Anemia, stable -Patient had one episode of black stool yesterday, [facility] -Hemoglobin stable at 8.3 on admission is now currently 8.1 with a hematocrit of 26.2 -Checked anemia panel this AM and showed an iron level 21, U IBC of 324, TIBC of 345, saturation ratio of 6%, ferritin level 11%, folate level of 28.8, and vitamin B12 level of 699 -Consulted Gastroenterology for further recommendations evaluation and patient underwent EGD.  -EGD showed showed normal esophagus and and nonbleeding superficial gastric ulcer at the fundus resulting from [facility] ablation of the AVM.  3 nonbleeding gastric ulcer with adherent clot in the gastric body resulting from APC ablation of the AVM and nonbleeding gastric ulcers with no stigmata of bleeding resulting from APC ablation of the AVMs and a normal duodenal bulb and second portion of the duodenum was seen and no specimens were collected. -Capsule endoscopy was then pursued and completed today and  imaging finished at 315.  I spoke with the gastroenterology team and they deemed the patient stable for discharge given that his hemoglobin did not drop significantly and was stable -The GI  team recommended stopping Plavix and no aspirin and starting back the Xarelto for discharge  Atrial Fibrillation, persistent -Patient is on Xarelto for anticoagulation which is currently on hold due to melena as above but has now been restarted given GI clearance  CAD status post PCI -Hold Plavix at this time and will discontinue altogether given GI recommendations -C/w Lipitor, Imdur. -Need to follow-up with cardiology in the outpatient setting  Hypertension -Blood pressure stable last blood pressure was 141/67 -Continue Amlodipine, Hydralazine, Hydrochlorothiazide, Cozaar at D/C  Hyponatremia -Mild at 134 -Continue monitor and trend and repeat CMP as an outpatient   GERD -Continue with home Protonix but increased to BID per GI reccs   Diabetes Mellitus Type 2 -No hemoglobin A1c on file but checked this visit and was 6.8 -We will continue to monitor patient's blood sugars carefully -Blood sugar on daily BMPs/CMP is have been ranging from 94-99  Obesity -Estimated body mass index is 31 kg/m as calculated from the following:   Height as of this encounter: 5\' 10"  (1.778 m).   Weight as of this encounter: 98 kg. -Weight Loss and Dietary Counseling given   Discharge Instructions Discharge Instructions    Call MD for:  difficulty breathing, headache or visual disturbances   Complete by:  As directed    Call MD for:  extreme fatigue   Complete by:  As directed    Call MD for:  hives   Complete by:  As directed    Call MD for:  persistant dizziness or light-headedness   Complete by:  As directed    Call MD for:  persistant nausea and vomiting   Complete by:  As directed    Call MD for:  redness, tenderness, or signs of infection (pain, swelling, redness, odor or green/yellow discharge around incision site)   Complete by:  As directed    Call MD for:  severe uncontrolled pain   Complete by:  As directed    Call MD for:  temperature >100.4   Complete by:  As directed     Diet - low sodium heart healthy   Complete by:  As directed    Discharge instructions   Complete by:  As directed    You were cared for by a hospitalist during your hospital stay. If you have any questions about your discharge medications or the care you received while you were in the hospital after you are discharged, you can call the unit and ask to speak with the hospitalist on call if the hospitalist that took care of you is not available. Once you are discharged, your primary care physician will handle any further medical issues. Please note that NO REFILLS for any discharge medications will be authorized once you are discharged, as it is imperative that you return to your primary care physician (or establish a relationship with a primary care physician if you do not have one) for your aftercare needs so that they can reassess your need for medications and monitor your lab values.  Follow up with PCP and Gastroenterology as an outpatient. Per GI no Aspirin and no Plavix but continue Xarelto. Take all medications as prescribed. If symptoms change or worsen please return to the ED for evaluation   Increase activity slowly   Complete by:  As directed  Allergies as of 03/22/2019      Reactions   Morphine And Related Swelling      Medication List    STOP taking these medications   clopidogrel 75 MG tablet Commonly known as:  PLAVIX     TAKE these medications   albuterol 108 (90 Base) MCG/ACT inhaler Commonly known as:  VENTOLIN HFA Inhale 1 puff into the lungs every 6 (six) hours as needed for wheezing or shortness of breath.   amLODipine 5 MG tablet Commonly known as:  NORVASC TAKE 1 TABLET EVERY DAY   atorvastatin 40 MG tablet Commonly known as:  LIPITOR TAKE 1 AND 1/2 TABLETS EVERY DAY What changed:  See the new instructions.   carvedilol 12.5 MG tablet Commonly known as:  COREG Take 12.5 mg by mouth 2 (two) times daily with a meal.   docusate sodium 100 MG  capsule Commonly known as:  COLACE Take 100 mg by mouth 2 (two) times daily.   hydrALAZINE 100 MG tablet Commonly known as:  APRESOLINE TAKE 1 TABLET THREE TIMES DAILY ( DOSE INCREASE ) What changed:  See the new instructions.   hydrochlorothiazide 12.5 MG tablet Commonly known as:  HYDRODIURIL Take 12.5 mg by mouth daily with breakfast.   isosorbide mononitrate 60 MG 24 hr tablet Commonly known as:  IMDUR TAKE 1 TABLET EVERY DAY What changed:  additional instructions   losartan 100 MG tablet Commonly known as:  COZAAR Take 100 mg by mouth daily.   magnesium hydroxide 400 MG/5ML suspension Commonly known as:  MILK OF MAGNESIA Take 15 mLs by mouth daily as needed for mild constipation.   multivitamin with minerals Tabs tablet Take 1 tablet by mouth daily. Centrum Silver   nitroGLYCERIN 0.4 MG SL tablet Commonly known as:  NITROSTAT DISSOLVE 1 TABLET UNDER THE TONGUE EVERY 5 MINUTES AS NEEDED FOR CHEST PAIN   Omega-3 Fish Oil 1200 MG Caps Take 1,200 mg by mouth 2 (two) times a day.   pantoprazole 40 MG tablet Commonly known as:  PROTONIX Take 1 tablet (40 mg total) by mouth 2 (two) times daily. What changed:  when to take this   rivaroxaban 20 MG Tabs tablet Commonly known as:  XARELTO Take 1 tablet (20 mg total) by mouth daily with supper.   SYSTANE ULTRA OP Place 1 drop into both eyes daily as needed (dry eyes).   vitamin B-12 1000 MCG tablet Commonly known as:  CYANOCOBALAMIN Take 1,000 mcg by mouth every Sunday.      Follow-up Information    Jacqualine Code, DO Follow up.   Specialty:  Family Medicine Why:  call office for 1 week appointment Contact information: [address] [place] [address] [phone]        Herminio Commons, MD .   Specialty:  Cardiology Contact information: [place] [address] [phone]          Allergies  Allergen Reactions  . Morphine And Related Swelling    Consultations:  Gastroenterology Dr. Laural Golden  Procedures/Studies: No results found.  EGD Findings:      The examined esophagus was normal.      One non-bleeding superficial gastric ulcer was found in the gastric       fundus. The lesion was 5 mm in largest dimension.      One non-bleeding superficial gastric ulcer with adherent clot was found       in the gastric body. The lesion was 3 mm in  largest dimension.      Two non-bleeding superficial gastric ulcers with no stigmata of bleeding       were found in the gastric antrum. The largest lesion was 3 mm in largest       dimension.      The exam of the stomach was otherwise normal.      The duodenal bulb and second portion of the duodenum were normal. Impression:               - Normal esophagus.                           - Non-bleeding superficial gastric ulcer at fundus                            resulting from APC ablation of AVM.                           - Non-bleeding gastric ulcer with adherent clot at                            gastric body resulting from APC ablation of AVM.                           - Non-bleeding gastric ulcers with no stigmata of                            bleeding resulting from APC ablation of AVMs.                           - Normal duodenal bulb and second portion of the                            duodenum.                           - No specimens collected.  Subjective: Seen and examined and he is doing well.  Denies chest pain, headaches or dizziness.  No more melena.  Tolerated his capsule endoscopy and had no other concerns or complaints at this time.  GI recommended the patient be discharged home as he is stable and he will need to follow-up with PCP, cardiology, and gastroenterology in outpatient setting and patient is understandable and agreeable with the plan  Discharge Exam: Vitals:   03/22/19 0529 03/22/19 1518  BP: 137/76 (!) 113/57  Pulse: (!) 55 (!) 51  Resp: 16 20  Temp: 98.3 F  (36.8 C) 98 F (36.7 C)  SpO2: 99% 100%   Vitals:   03/21/19 2023 03/21/19 2119 03/22/19 0529 03/22/19 1518  BP:  (!) 141/67 137/76 (!) 113/57  Pulse:  (!) 58 (!) 55 (!) 51  Resp:  16 16 20   Temp:  97.9 F (36.6 C) 98.3 F (36.8 C) 98 F (36.7 C)  TempSrc:  Oral Oral Oral  SpO2: 94% 98% 99% 100%  Weight:      Height:       General: Pt is alert, awake, not in acute distress Cardiovascular: RRR, S1/S2 +, no rubs, no gallops Respiratory: CTA bilaterally, no wheezing, no rhonchi Abdominal:  Soft, NT, distended secondary body habitus, bowel sounds + Extremities: no edema, no cyanosis  The results of significant diagnostics from this hospitalization (including imaging, microbiology, ancillary and laboratory) are listed below for reference.    Microbiology: Recent Results (from the past 240 hour(s))  SARS Coronavirus 2 (CEPHEID - Performed in [place] hospital lab), Hosp Order     Status: None   Collection Time: 03/21/19  1:14 AM  Result Value Ref Range Status   SARS Coronavirus 2 NEGATIVE NEGATIVE Final    Comment: (NOTE) If result is NEGATIVE SARS-CoV-2 target nucleic acids are NOT DETECTED. The SARS-CoV-2 RNA is generally detectable in upper and lower  respiratory specimens during the acute phase of infection. The lowest  concentration of SARS-CoV-2 viral copies this assay can detect is 250  copies / mL. A negative result does not preclude SARS-CoV-2 infection  and should not be used as the sole basis for treatment or other  patient management decisions.  A negative result may occur with  improper specimen collection / handling, submission of specimen other  than nasopharyngeal swab, presence of viral mutation(s) within the  areas targeted by this assay, and inadequate number of viral copies  (<250 copies / mL). A negative result must be combined with clinical  observations, patient history, and epidemiological information. If result is POSITIVE SARS-CoV-2 target nucleic  acids are DETECTED. The SARS-CoV-2 RNA is generally detectable in upper and lower  respiratory specimens dur ing the acute phase of infection.  Positive  results are indicative of active infection with SARS-CoV-2.  Clinical  correlation with patient history and other diagnostic information is  necessary to determine patient infection status.  Positive results do  not rule out bacterial infection or co-infection with other viruses. If result is PRESUMPTIVE POSTIVE SARS-CoV-2 nucleic acids MAY BE PRESENT.   A presumptive positive result was obtained on the submitted specimen  and confirmed on repeat testing.  While 2019 novel coronavirus  (SARS-CoV-2) nucleic acids may be present in the submitted sample  additional confirmatory testing may be necessary for epidemiological  and / or clinical management purposes  to differentiate between  SARS-CoV-2 and other Sarbecovirus currently known to infect humans.  If clinically indicated additional testing with an alternate test  methodology [phone]) is advised. The SARS-CoV-2 RNA is generally  detectable in upper and lower respiratory sp ecimens during the acute  phase of infection. The expected result is Negative. Fact Sheet for Patients:  StrictlyIdeas.no Fact Sheet for Healthcare Providers: BankingDealers.co.za This test is not yet approved or cleared by the [place] FDA and has been authorized for detection and/or diagnosis of SARS-CoV-2 by FDA under an Emergency Use Authorization (EUA).  This EUA will remain in effect (meaning this test can be used) for the duration of the COVID-19 declaration under Section 564(b)(1) of the Act, 21 U.S.C. section 360bbb-3(b)(1), unless the authorization is terminated or revoked sooner. Performed at [facility], [address] [place], [place] [address]     Labs: BNP (last 3 results) No results for input(s): BNP in the last 8760 hours. Basic  Metabolic Panel: Recent Labs  Lab 03/21/19 0145 03/21/19 0754 03/22/19 0516  NA 135 133* 134*  K 3.9 3.8 4.3  CL 104 101 102  CO2 22 23 24   GLUCOSE 94 99 86  BUN 14 13 11   CREATININE 1.15 1.17 1.07  CALCIUM 8.7* 8.8* 8.8*  MG  --  1.9 1.8  PHOS  --  2.8 3.1   Liver Function  Tests: Recent Labs  Lab 03/21/19 0754 03/22/19 0516  AST 19 19  ALT 16 15  ALKPHOS 46 49  BILITOT 0.5 0.6  PROT 6.6 6.4*  ALBUMIN 3.4* 3.4*   No results for input(s): LIPASE, AMYLASE in the last 168 hours. No results for input(s): AMMONIA in the last 168 hours. CBC: Recent Labs  Lab 03/21/19 0145 03/21/19 0724 03/22/19 0516  WBC 4.1 4.4 3.3*  NEUTROABS  --   --  1.7  HGB 8.3* 8.1* 8.1*  HCT 26.7* 26.2* 26.1*  MCV 82.4 82.1 82.6  PLT 255 280 271   Cardiac Enzymes: No results for input(s): CKTOTAL, CKMB, CKMBINDEX, TROPONINI in the last 168 hours. BNP: Invalid input(s): POCBNP CBG: No results for input(s): GLUCAP in the last 168 hours. D-Dimer No results for input(s): DDIMER in the last 72 hours. Hgb A1c Recent Labs    03/21/19 0145  HGBA1C 6.8*   Lipid Profile No results for input(s): CHOL, HDL, LDLCALC, TRIG, CHOLHDL, LDLDIRECT in the last 72 hours. Thyroid function studies No results for input(s): TSH, T4TOTAL, T3FREE, THYROIDAB in the last 72 hours.  Invalid input(s): FREET3 Anemia work up Recent Labs    03/22/19 0516  VITAMINB12 699  FOLATE 28.8  FERRITIN 11*  TIBC 345  IRON 21*  RETICCTPCT 1.7   Urinalysis No results found for: COLORURINE, APPEARANCEUR, LABSPEC, [place], GLUCOSEU, [place], [place], [place], PROTEINUR, UROBILINOGEN, NITRITE, LEUKOCYTESUR Sepsis Labs Invalid input(s): PROCALCITONIN,  WBC,  LACTICIDVEN Microbiology Recent Results (from the past 240 hour(s))  SARS Coronavirus 2 (CEPHEID - Performed in [place] hospital lab), Hosp Order     Status: None   Collection Time: 03/21/19  1:14 AM  Result Value Ref Range Status   SARS Coronavirus 2  NEGATIVE NEGATIVE Final    Comment: (NOTE) If result is NEGATIVE SARS-CoV-2 target nucleic acids are NOT DETECTED. The SARS-CoV-2 RNA is generally detectable in upper and lower  respiratory specimens during the acute phase of infection. The lowest  concentration of SARS-CoV-2 viral copies this assay can detect is 250  copies / mL. A negative result does not preclude SARS-CoV-2 infection  and should not be used as the sole basis for treatment or other  patient management decisions.  A negative result may occur with  improper specimen collection / handling, submission of specimen other  than nasopharyngeal swab, presence of viral mutation(s) within the  areas targeted by this assay, and inadequate number of viral copies  (<250 copies / mL). A negative result must be combined with clinical  observations, patient history, and epidemiological information. If result is POSITIVE SARS-CoV-2 target nucleic acids are DETECTED. The SARS-CoV-2 RNA is generally detectable in upper and lower  respiratory specimens dur ing the acute phase of infection.  Positive  results are indicative of active infection with SARS-CoV-2.  Clinical  correlation with patient history and other diagnostic information is  necessary to determine patient infection status.  Positive results do  not rule out bacterial infection or co-infection with other viruses. If result is PRESUMPTIVE POSTIVE SARS-CoV-2 nucleic acids MAY BE PRESENT.   A presumptive positive result was obtained on the submitted specimen  and confirmed on repeat testing.  While 2019 novel coronavirus  (SARS-CoV-2) nucleic acids may be present in the submitted sample  additional confirmatory testing may be necessary for epidemiological  and / or clinical management purposes  to differentiate between  SARS-CoV-2 and other Sarbecovirus currently known to infect humans.  If clinically indicated additional testing with an alternate test  methodology [phone])  is advised. The SARS-CoV-2 RNA is generally  detectable in upper and lower respiratory sp ecimens during the acute  phase of infection. The expected result is Negative. Fact Sheet for Patients:  StrictlyIdeas.no Fact Sheet for Healthcare Providers: BankingDealers.co.za This test is not yet approved or cleared by the [place] FDA and has been authorized for detection and/or diagnosis of SARS-CoV-2 by FDA under an Emergency Use Authorization (EUA).  This EUA will remain in effect (meaning this test can be used) for the duration of the COVID-19 declaration under Section 564(b)(1) of the Act, 21 U.S.C. section 360bbb-3(b)(1), unless the authorization is terminated or revoked sooner. Performed at [facility], [address] [place], [place] [address]    Time coordinating discharge: 35 minutes  SIGNED:  Kerney Elbe, [facility] 03/22/2019, 7:47 PM Pager is on [place]  If 7PM-7AM, please contact night-coverage [email or web address] Password [email or web address]

## 2019-03-22 NOTE — Progress Notes (Addendum)
Subjective:  Feels good. Ready to go home. No complaints.   Objective: Vital signs in last 24 hours: Temp:  [97.9 F (36.6 C)-98.6 F (37 C)] 98.3 F (36.8 C) (06/04 0529) Pulse Rate:  [55-86] 55 (06/04 0529) Resp:  [15-20] 16 (06/04 0529) BP: (132-161)/(59-78) 137/76 (06/04 0529) SpO2:  [94 %-100 %] 99 % (06/04 0529) Last BM Date: 03/20/19 General:   Alert,  Well-developed, well-nourished, pleasant and cooperative in NAD Head:  Normocephalic and atraumatic. Eyes:  Sclera clear, no icterus.  Abdomen:  Soft, nontender and nondistended.  Neurologic:  Alert and  oriented x4;  grossly normal neurologically. Psych:  Alert and cooperative. Normal mood and affect.  Intake/Output from previous day: 06/03 0701 - 06/04 0700 In: 480 [P.O.:480] Out: 175 [Urine:175] Intake/Output this shift: No intake/output data recorded.  Lab Results: CBC Recent Labs    03/21/19 0145 03/21/19 0724 03/22/19 0516  WBC 4.1 4.4 3.3*  HGB 8.3* 8.1* 8.1*  HCT 26.7* 26.2* 26.1*  MCV 82.4 82.1 82.6  PLT 255 280 271   BMET Recent Labs    03/21/19 0145 03/21/19 0754 03/22/19 0516  NA 135 133* 134*  K 3.9 3.8 4.3  CL 104 101 102  CO2 22 23 24   GLUCOSE 94 99 86  BUN 14 13 11   CREATININE 1.15 1.17 1.07  CALCIUM 8.7* 8.8* 8.8*   LFTs Recent Labs    03/21/19 0754 03/22/19 0516  BILITOT 0.5 0.6  ALKPHOS 46 49  AST 19 19  ALT 16 15  PROT 6.6 6.4*  ALBUMIN 3.4* 3.4*   No results for input(s): LIPASE in the last 72 hours. PT/INR No results for input(s): LABPROT, INR in the last 72 hours.    Imaging Studies: No results found.[2 weeks]   Assessment:  Pleasant 85 year old gentleman with history of CAD status post DES and angioplasty December 06, 2018 as well as A. fib, on Xarelto and ?Plavix who was recently evaluated for GI bleeding and melena about 2 weeks ago and had an EGD which revealed gastric AVMs with stigmata of bleed.  Underwent APC ablation.  Patient reports resuming  anticoagulation a few days after procedure.  He is unclear whether he is on both Plavix and Xarelto.  He states his aspirin was stopped a couple months ago after his stent was placed.  Returned yesterday with 1 week of melena.  His hemoglobin back in February was 11.2.  On presentation yesterday was 8.3, today 8.1.  He has not received any packed red blood cells.  EGD yesterday showed nonbleeding superficial gastric ulcer at the fundus resulting from [facility] ablation.  No active bleeding noted were induced during procedure, he may or may not ablate from the sites.  Patient swallowed small bowel capsule endoscopy this morning to visualize the small bowel for further possible etiologies of bleeding.  Patient reports his last colonoscopy was unremarkable around 2018 or 2019.  He has a history of remote colon cancer over 20 years ago.  Plan: 1. Patient could be discharged from a GI standpoint after his capsule endoscopy is complete at 1515 this afternoon. Images from capsule study to be read as available.  2. We need to determine which anticoagulation he has been on.  According to cardiology in April, he should be on Xarelto and Plavix but patient cannot tell us for sure whether he is taking both.  Per cardiology notes, if anticoagulation needs to be stopped for GI bleeding, Plavix could be stopped, 1 month after stent placement back  in February.  There plans were for 6 months of Plavix and chronic Xarelto for Afib.   Laureen Ochs. Bernarda Caffey [facility] [phone] 6/4/202010:23 AM     LOS: 0 days

## 2019-03-22 NOTE — Telephone Encounter (Signed)
Can we move up patient's appt up some? He is getting out of hospital today for GI bleeding. Maybe see in 3 weeks.    NEEDS CBC next week. Please arrange and make sure another provider sees results while i'm out of town.

## 2019-03-22 NOTE — Telephone Encounter (Signed)
Per Neil Crouch, PA, I called pt's wife, Barbaraann Share. She said pt does his own meds at home, but that probably he will need some help as forgetful as he is getting. She took his bag of meds and read off all of the names and info to me. She said he had been taking off of the ASA and he is on the Plavix and Xarelto.

## 2019-03-23 ENCOUNTER — Other Ambulatory Visit: Payer: Self-pay

## 2019-03-23 ENCOUNTER — Telehealth: Payer: Self-pay | Admitting: Nurse Practitioner

## 2019-03-23 DIAGNOSIS — D649 Anemia, unspecified: Secondary | ICD-10-CM

## 2019-03-23 DIAGNOSIS — M795 Residual foreign body in soft tissue: Secondary | ICD-10-CM

## 2019-03-23 NOTE — Telephone Encounter (Signed)
PT's wife is aware and she will discuss with PCP the need for help with meds.

## 2019-03-23 NOTE — Telephone Encounter (Signed)
Preliminary read of Givens study is incomplete (did NOT see capsule enter cecum/large colon). Notified the patient of need for abdominal XRay, order entered for 03/24/19. He can report to [facility] radiology at a time of his convenience. He verbalized understanding.

## 2019-03-23 NOTE — Telephone Encounter (Signed)
Can we use one of your urgent openings?

## 2019-03-23 NOTE — Telephone Encounter (Signed)
PT's wife said he is supposed to see PCP within 5 days. She will call and get appointment with Dr. Elvina Mattes and see if he will do the CBC. I told her to give me a call when she has the appointment and I will fax the order to Dr. Elvina Mattes and she said she will do so.

## 2019-03-24 NOTE — Telephone Encounter (Signed)
May use urgent. 

## 2019-03-26 ENCOUNTER — Encounter (HOSPITAL_COMMUNITY): Payer: Self-pay | Admitting: Internal Medicine

## 2019-03-26 NOTE — Telephone Encounter (Signed)
I called pt's wife and she forgot to get the apt at PCP.  She will call this evening or tomorrow morning and let me know.  I told her if he doesn't see PCP this week she will need to take him to get blood work.

## 2019-03-26 NOTE — Telephone Encounter (Signed)
CALLED PATIENT AND SCHEDULED HIM FOR SOONER APPOINTMENT

## 2019-03-27 NOTE — Telephone Encounter (Signed)
Antonio Watkins is aware to look for the labs in my absence.

## 2019-03-27 NOTE — Telephone Encounter (Signed)
Per pt's wife, pt has appt to see PCP on 03/29/2019 at 1:00 pm.

## 2019-03-27 NOTE — Telephone Encounter (Signed)
Noted  

## 2019-03-27 NOTE — Telephone Encounter (Signed)
Pt said he was feeling great and could wait until OCT to follow up with [facility]

## 2019-03-27 NOTE — Telephone Encounter (Signed)
I called Dr. Junius Finner office and confirmed he will be seen Thursday. I spoke to receptionist and she is aware I am faxing orders for the CBC to be done there that day. She said they draw labs and they are sent out. She is aware results need to be faxed to [place] at once when available.  I am faxing to [phone].  I put on there for results to be faxed to [place].

## 2019-03-28 ENCOUNTER — Encounter (HOSPITAL_COMMUNITY): Payer: Self-pay | Admitting: Gastroenterology

## 2019-03-29 NOTE — Procedures (Addendum)
Small Bowel Givens Capsule Study Procedure date:  03/22/2019  Referring Provider:  Dr. Laural Golden while inpatient PCP:  Dr. Elvina Mattes, Claudette Laws, DO  Indication for procedure:   85 year old male with history of CAD s/p DES and angioplasty in Feb 2020, afib, on Xarelto and Plavix presenting to [facility] again in June 2020 with acute on chronic anemia and melena. 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 Capsule study now to evaluate small bowel.    Findings:   Capsule was not complete to the cecum. No obvious ulcerations, stigmata of bleed in small bowel, or mass. Starting at 04:30:16, multiple blue, well defined areas scattered throughout small bowel through 4:43:39. No stigmata of bleeding. Query a variant of venous structures, unable to rule out small bowel varices. Will need to review with attending in further detail.   First Gastric image:  00:00:30 First Duodenal image: 01:19:07 First Ileo-Cecal Valve image: Not pictured First Cecal image: Not pictured as capsule did not make it to the cecum Gastric Passage time: 1h 98m Small Bowel Passage time:  Unable to calculate  Summary & Recommendations: 85 year old gentleman gentleman with history of CAD s/p DES and angioplasty 12/06/2018, history of afib, on Xarelto and possibly Plavix at time of admission, admitted with melena and acute on chronic anemia. EGD during admission with non-bleeding superficial gastric ulcer at the fundus resulting from prior APC ablation several weeks ago (Mar 09, 2019), no active bleeding. Capsule study without obvious mass or ulceration; however this did not make it to the cecum. We have already requested an abdominal xray, which still needs to be completed. Blue-ish, well-defined areas throughout small bowel bowel resembling venous etiology (?varices or some type of venous formation) were noted but without stigmata of bleeding. No evidence for esophageal varices on EGD. Unknown if history of liver  disease and no thrombocytopenia noted.   CBC is pending from PCP, drawn on 6/11. We are requesting this. Plavix and aspirin stopped now and only on Xarelto. Due to IDA in setting of blood loss, referring to Hematology as could benefit from IV iron infusions. Will need serial monitoring of CBC, iron studies. Avoid NSAIDs completely. Still needs abdominal xray to confirm passage of capsule, as this did not make it to the cecum. Patient was notified of this already and still needs to complete.   Annitta Needs, PhD, ANP-BC [facility]   Addendum: reviewed pertinent images with Dr. Gala Romney. After further refer, polypoid-type lesion with vascular component appreciated and appears to be the same lesion starting at 04:30:16. Needs CTE for further evaluation. Continue plans for Hematology.  Annitta Needs, PhD, ANP-BC [facility]   Attending note:  Pertinent images reviewed.  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  Agree with further evaluation via cross-sectional imaging.

## 2019-03-29 NOTE — Telephone Encounter (Signed)
Alicia:  Please let patient know that I have read his capsule study report in detail. He was supposed to have an abdominal xray as the capsule did not make it to the cecum. Has he seen the capsule in his stool? If not, we need to do an xray this week. It can be a walk-in appt to radiology.  I have some images to review with Dr. Gala Romney. No obvious mass, overt GI bleeding, ulcers in small bowel. There were some areas that may be enlarged veins that could possibly contribute to bleeding but there was no stigmata of bleeding there. I have to review this with Dr. Gala Romney. Full note to follow.

## 2019-04-02 NOTE — Telephone Encounter (Signed)
Has he seen it pass in his stool? If done at [facility], it will still need to be faxed here. [facility] clinical pool: how do we arrange a one view abdominal xray at [facility]? This is already in system for [facility]. Reason: capsule study not complete.

## 2019-04-02 NOTE — Telephone Encounter (Signed)
DG abd 1 view order faxed to [facility].  Elmo Putt, let pt know when you speak to him that [facility] should call him.

## 2019-04-02 NOTE — Addendum Note (Signed)
Addended by: Hassan Rowan on: 04/02/2019 04:14 PM   Modules accepted: Orders

## 2019-04-02 NOTE — Telephone Encounter (Signed)
Pt and spouse are aware that [facility] should call them.

## 2019-04-02 NOTE — Telephone Encounter (Signed)
Pt and spouse would like to know if Xray can be done at [facility] since they live closer than [facility].

## 2019-04-02 NOTE — Telephone Encounter (Signed)
AB, pt said he doesn't look at his stool once it comes out. He isn't sure if the capsule came out of his stool.

## 2019-04-04 ENCOUNTER — Other Ambulatory Visit: Payer: Self-pay

## 2019-04-04 MED ORDER — AMLODIPINE BESYLATE 5 MG PO TABS: 5.0000 mg | ORAL_TABLET | Freq: Every day | ORAL | 2 refills | Status: DC

## 2019-04-04 NOTE — Telephone Encounter (Signed)
Refill sent.

## 2019-04-06 ENCOUNTER — Other Ambulatory Visit: Payer: Self-pay

## 2019-04-06 MED ORDER — AMLODIPINE BESYLATE 5 MG PO TABS: 5.0000 mg | ORAL_TABLET | Freq: Every day | ORAL | 2 refills | Status: DC

## 2019-04-08 NOTE — Telephone Encounter (Signed)
Doris,   Can you see if the CBC is back? He had done at PCP I think but not in Epic?  Also, see if abd film back that Vicente Males ordered, done at [employer].

## 2019-04-09 NOTE — Telephone Encounter (Signed)
Spoke with Dr. Junius Finner nurse and she is faxed CBC results today.

## 2019-04-09 NOTE — Telephone Encounter (Signed)
Left a detailed message for Dr. Junius Finner nurse. Waiting on a return call.

## 2019-04-10 ENCOUNTER — Telehealth: Payer: Self-pay | Admitting: Gastroenterology

## 2019-04-10 DIAGNOSIS — K6389 Other specified diseases of intestine: Secondary | ICD-10-CM

## 2019-04-10 NOTE — Telephone Encounter (Signed)
Alicia: please let patient know that I reviewed images with Dr. Gala Romney. There is a polyp-type structure that has a vascular component. Needs further imaging to sort out.  [facility] clinical pool: please arrange CTE due to small bowel polypoid lesion on capsule study.

## 2019-04-10 NOTE — Telephone Encounter (Signed)
Paperwork placed on Anna's desk for the iron infusion.

## 2019-04-10 NOTE — Telephone Encounter (Signed)
LMOM for a return call.  

## 2019-04-10 NOTE — Telephone Encounter (Signed)
[phone] patient wife called, please call back

## 2019-04-10 NOTE — Telephone Encounter (Signed)
Requested any recent xrays, CTs and/or imagings from [employer] [facility]

## 2019-04-10 NOTE — Addendum Note (Signed)
Addended by: Inge Rise on: 04/10/2019 12:14 PM   Modules accepted: Orders

## 2019-04-10 NOTE — Telephone Encounter (Signed)
I left Vm for a return call to discuss meds prior to scheduling Iron Infusion.

## 2019-04-10 NOTE — Telephone Encounter (Addendum)
Hgb 8.1 at beginning of June, now 7.8 as of 6/15. Ferritin 11.   Doris: we can set him up for iron infusion. Ensure he is only taking Xarelto and NO Plavix or aspirin.   [facility] clinical pool: please refer to Hematology as well.

## 2019-04-10 NOTE — Telephone Encounter (Addendum)
Received the lab result done at PCP on 04/02/2019. Hemoglobin 7.8. ( Placing report on Anna's desk for review) Forwarding to Manuela Schwartz to get X-ray report from [place] to Roseanne Kaufman, NP to advise in Leslie's absence.

## 2019-04-10 NOTE — Telephone Encounter (Signed)
referraL SENT

## 2019-04-11 ENCOUNTER — Telehealth: Payer: Self-pay | Admitting: Gastroenterology

## 2019-04-11 NOTE — Telephone Encounter (Signed)
PATIENT WIFE CALLED BACK, PLEASE CALL

## 2019-04-11 NOTE — Telephone Encounter (Signed)
See separate note, I spoke with them today.

## 2019-04-11 NOTE — Telephone Encounter (Signed)
I spoke to pt and wife today and they said he is taking Xarelto but NO Plavix and No ASA.  Forwarding back to Roseanne Kaufman, NP to sign paperwork for iron transfusion.

## 2019-04-12 ENCOUNTER — Ambulatory Visit: Payer: Medicare PPO | Admitting: Nurse Practitioner

## 2019-04-13 ENCOUNTER — Other Ambulatory Visit: Payer: Self-pay

## 2019-04-13 MED ORDER — AMLODIPINE BESYLATE 5 MG PO TABS: 5.0000 mg | ORAL_TABLET | Freq: Every day | ORAL | 2 refills | Status: DC

## 2019-04-13 NOTE — Telephone Encounter (Signed)
Pt's spouse notified of results. [facility] see Ab's note, arrange CTE due to small bowel polypoid lesion

## 2019-04-13 NOTE — Telephone Encounter (Signed)
Refilled amlodipine to [facility]

## 2019-04-16 NOTE — Telephone Encounter (Signed)
Orders have been faxed to [facility] and I left Vm for return call to let pt know he will be receiving a call about scheduling this.

## 2019-04-16 NOTE — Addendum Note (Signed)
Addended by: Inge Rise on: 04/16/2019 08:48 AM   Modules accepted: Orders

## 2019-04-16 NOTE — Telephone Encounter (Signed)
LMOM to call.

## 2019-04-16 NOTE — Telephone Encounter (Signed)
Needs Feraheme 510 mg IV for initial dose, then repeat Feraheme 510 mg IV X 1 a week later.   We STILL do not have the abdominal xray he was to have at [facility] despite multiple requests from this office. Please request specifically ABDOMINAL XRAY. The capsule did NOT make it to the cecum.

## 2019-04-16 NOTE — Telephone Encounter (Signed)
LMTCB. CTE scheduled for 7/14 at 5:00pm, arrival time 4:00pm to start drinking prep. NPO 4 hrs prior. Letter mailed with appt information.  PA initiated via [employer]. Clinicals faxed. Tracking# [id number]

## 2019-04-17 ENCOUNTER — Telehealth: Payer: Self-pay | Admitting: Gastroenterology

## 2019-04-17 NOTE — Telephone Encounter (Signed)
Patient does not need an appt at [facility]. Doris please inform patient thanks

## 2019-04-17 NOTE — Telephone Encounter (Signed)
Pts wife is aware 

## 2019-04-17 NOTE — Telephone Encounter (Signed)
PA has been approved. Dates valid 04/30/2019-05/30/2019. Auth # [id number]  Called patient and he is aware of CTE appt. He voiced understanding

## 2019-04-17 NOTE — Telephone Encounter (Signed)
PT is aware that he needs the Abdominal x ray. He is not sure he saw the capsule pass. He will get the x-ray when he goes to [facility] for another apt.

## 2019-04-17 NOTE — Telephone Encounter (Signed)
[facility] called to let us know that patient has not had any xrays, imagings, etc in several years. The last abdominal XR was in 2008.

## 2019-04-17 NOTE — Telephone Encounter (Signed)
I have requested again for [facility] to fax US abdominal xray

## 2019-04-17 NOTE — Telephone Encounter (Signed)
Doris: please let patient know we need an abdominal xray completed. HOWEVER, if he saw the camera capsule pass in his stool, we do not need this. Routing to [facility] clinical pool as FYI. Let's just have this done at [facility].

## 2019-04-18 ENCOUNTER — Ambulatory Visit (HOSPITAL_COMMUNITY): Payer: Medicare PPO | Admitting: Hematology

## 2019-04-19 ENCOUNTER — Encounter

## 2019-04-19 ENCOUNTER — Other Ambulatory Visit: Payer: Self-pay

## 2019-04-19 ENCOUNTER — Ambulatory Visit (HOSPITAL_COMMUNITY)
Admission: RE | Admit: 2019-04-19 | Discharge: 2019-04-19 | Disposition: A | Discharge status: Home / Self Care | Payer: Medicare PPO | Source: Ambulatory Visit | Attending: Gastroenterology | Admitting: Gastroenterology

## 2019-04-19 ENCOUNTER — Ambulatory Visit: Payer: Medicare PPO | Admitting: Nurse Practitioner

## 2019-04-19 ENCOUNTER — Encounter (HOSPITAL_COMMUNITY)
Admission: RE | Admit: 2019-04-19 | Discharge: 2019-04-19 | Disposition: A | Discharge status: Home / Self Care | Payer: Medicare PPO | Source: Ambulatory Visit | Attending: Gastroenterology | Admitting: Gastroenterology

## 2019-04-19 ENCOUNTER — Ambulatory Visit: Payer: Medicare PPO | Admitting: Gastroenterology

## 2019-04-19 DIAGNOSIS — M795 Residual foreign body in soft tissue: Secondary | ICD-10-CM

## 2019-04-19 MED ORDER — SODIUM CHLORIDE 0.9 % IV SOLN
510.0000 mg | Freq: Once | INTRAVENOUS | Status: AC
  Administered 2019-04-19: 510 mg via INTRAVENOUS
  Filled 2019-04-19: qty 17

## 2019-04-19 MED ORDER — SODIUM CHLORIDE 0.9 % IV SOLN
INTRAVENOUS | Status: DC
  Administered 2019-04-19: 14:00:00 via INTRAVENOUS

## 2019-04-23 NOTE — Progress Notes (Signed)
Pt is aware.  

## 2019-04-23 NOTE — Progress Notes (Signed)
No capsule retained, as expected. Appreciate that he completed this.

## 2019-04-25 ENCOUNTER — Encounter: Payer: Self-pay | Admitting: Cardiovascular Disease

## 2019-04-25 ENCOUNTER — Encounter (HOSPITAL_COMMUNITY): Payer: Self-pay | Admitting: Surgery

## 2019-04-25 ENCOUNTER — Telehealth (INDEPENDENT_AMBULATORY_CARE_PROVIDER_SITE_OTHER): Payer: Medicare PPO | Admitting: Cardiovascular Disease

## 2019-04-25 VITALS — BP 155/67 | HR 83 | Ht 70.0 in | Wt 200.0 lb

## 2019-04-25 DIAGNOSIS — I25118 Atherosclerotic heart disease of native coronary artery with other forms of angina pectoris: Secondary | ICD-10-CM | POA: Diagnosis not present

## 2019-04-25 DIAGNOSIS — E785 Hyperlipidemia, unspecified: Secondary | ICD-10-CM

## 2019-04-25 DIAGNOSIS — I4819 Other persistent atrial fibrillation: Secondary | ICD-10-CM

## 2019-04-25 DIAGNOSIS — I10 Essential (primary) hypertension: Secondary | ICD-10-CM

## 2019-04-25 DIAGNOSIS — D649 Anemia, unspecified: Secondary | ICD-10-CM

## 2019-04-25 DIAGNOSIS — Z7901 Long term (current) use of anticoagulants: Secondary | ICD-10-CM

## 2019-04-25 NOTE — Patient Instructions (Addendum)
Medication Instructions:  Continue all current medications.  Labwork: none  Testing/Procedures: none  Follow-Up: 3 months   Any Other Special Instructions Will Be Listed Below (If Applicable).  If you need a refill on your cardiac medications before your next appointment, please call your pharmacy.  

## 2019-04-25 NOTE — Progress Notes (Signed)
Virtual Visit via Telephone Note   This visit type was conducted due to national recommendations for restrictions regarding the COVID-19 Pandemic (e.g. social distancing) in an effort to limit this patient's exposure and mitigate transmission in our community.  Due to his co-morbid illnesses, this patient is at least at moderate risk for complications without adequate follow up.  This format is felt to be most appropriate for this patient at this time.  The patient did not have access to video technology/had technical difficulties with video requiring transitioning to audio format only (telephone).  All issues noted in this document were discussed and addressed.  No physical exam could be performed with this format.  Please refer to the patient's chart for his  consent to telehealth for [facility].   Date:  04/25/2019   ID:  Antonio Watkins, DOB [birth year], MRN [id number]  Patient Location: Home Provider Location: Office  PCP:  Jacqualine Code, DO  Cardiologist:  Kate Sable, MD  Electrophysiologist:  None   Evaluation Performed:  Follow-Up Visit  Chief Complaint: Coronary artery disease, anemia, atrial fibrillation  History of Present Illness:    Antonio Watkins is a 85 y.o. male with persistent atrial fibrillation, coronary disease, and anemia.  I last saw him on 02/15/2019 he was complaining of exertional dyspnea.  I checked a CBC and hemoglobin was 8.4 on 02/16/2019.  I made a referral to GI.  Most recent hemoglobin in the system is 7.8 on 04/02/2019.  He underwent an EGD on 03/21/2019 which showed a small ulcer at the gastric fundus and body covered with a tiny clot but no bleeding induced on washing these areas.  Lesions have resolved.  APC ablation of AV malformations on 03/09/2019.  There was small superficial ulcers at the antrum felt to be sites of prior AV malformations which were ablated with APC.  He then underwent a small bowel given capsule study.  There were no  obvious ulcerations or stigmata of bleeding.  There is a possibility of small bowel varices.  However, the capsule did not make it to the cecum.  He has been scheduled for CT imaging this month by GI.  He's going to get some IV iron tomorrow. His shortness of breath has improved. He denies chest pain, orthopnea, and PND.  His appetite is good.  He told me his daughter from [place] may be calling, Antonio Watkins.  He said it was permissible to provide her with any medical information regarding the patient.  The patient does not have symptoms concerning for COVID-19 infection (fever, chills, cough, or new shortness of breath).    Past Medical History:  Diagnosis Date  . Anginal pain ([place])   . Atrial fibrillation ([place])   . CAD (coronary artery disease)   . Cancer of sigmoid colon ([place])    approx 1990  . DM type 2 (diabetes mellitus, type 2) ([place])   . GERD (gastroesophageal reflux disease)   . Gout   . Hiatal hernia   . Hyperlipemia   . Hypertension   . Incontinence    Past Surgical History:  Procedure Laterality Date  . BIOPSY  03/09/2019   Procedure: BIOPSY;  Surgeon: Danie Binder, MD;  [facility];  Service: Endoscopy;;  . cataracts    . CHOLECYSTECTOMY    . COLONOSCOPY     STOPPED BREATHING  . CORONARY STENT INTERVENTION  12/06/2018  . CORONARY STENT INTERVENTION N/A 12/06/2018   Procedure: CORONARY STENT INTERVENTION;  Surgeon: Larae Grooms  S, MD;  Location: [facility];  Service: Cardiovascular;  Laterality: N/A;  . ESOPHAGOGASTRODUODENOSCOPY N/A 03/09/2019   Procedure: ESOPHAGOGASTRODUODENOSCOPY (EGD);  Surgeon: Danie Binder, MD;  [facility];  Service: Endoscopy;  Laterality: N/A;  10:30am - pt knows to arrive at 10:15  . ESOPHAGOGASTRODUODENOSCOPY N/A 03/21/2019   Procedure: ESOPHAGOGASTRODUODENOSCOPY (EGD);  Surgeon: Rogene Houston, MD;  [facility];  Service: Endoscopy;  Laterality: N/A;  . GIVENS CAPSULE  STUDY N/A 03/22/2019   Procedure: GIVENS CAPSULE STUDY;  Surgeon: Danie Binder, MD;  [facility];  Service: Endoscopy;  Laterality: N/A;  . MINOR CARPAL TUNNEL    . REPLACEMENT TOTAL KNEE    . RIGHT/LEFT HEART CATH AND CORONARY ANGIOGRAPHY N/A 12/06/2018   Procedure: RIGHT/LEFT HEART CATH AND CORONARY ANGIOGRAPHY;  Surgeon: Jettie Booze, MD;  Location: [place] [facility];  Service: Cardiovascular;  Laterality: N/A;     Current Meds  Medication Sig  . albuterol (PROVENTIL HFA;VENTOLIN HFA) 108 (90 Base) MCG/ACT inhaler Inhale 1 puff into the lungs every 6 (six) hours as needed for wheezing or shortness of breath.  Marland Kitchen atorvastatin (LIPITOR) 40 MG tablet TAKE 1 AND 1/2 TABLETS EVERY DAY (Patient taking differently: Take 60 mg by mouth daily. )  . hydrALAZINE (APRESOLINE) 100 MG tablet TAKE 1 TABLET THREE TIMES DAILY ( DOSE INCREASE ) (Patient taking differently: Take 100 mg by mouth 3 (three) times daily. )  . hydrochlorothiazide (HYDRODIURIL) 12.5 MG tablet Take 12.5 mg by mouth daily with breakfast.   . losartan (COZAAR) 100 MG tablet Take 100 mg by mouth daily.  . magnesium hydroxide (MILK OF MAGNESIA) 400 MG/5ML suspension Take 15 mLs by mouth daily as needed for mild constipation.  . Multiple Vitamin (MULTIVITAMIN WITH MINERALS) TABS tablet Take 1 tablet by mouth daily. Centrum Silver  . nitroGLYCERIN (NITROSTAT) 0.4 MG SL tablet DISSOLVE 1 TABLET UNDER THE TONGUE EVERY 5 MINUTES AS NEEDED FOR CHEST PAIN  . Omega-3 Fatty Acids (OMEGA-3 FISH OIL) 1200 MG CAPS Take 1,200 mg by mouth 2 (two) times a day.   . pantoprazole (PROTONIX) 40 MG tablet Take 1 tablet (40 mg total) by mouth 2 (two) times daily.  Vladimir Faster Glycol-Propyl Glycol (SYSTANE ULTRA OP) Place 1 drop into both eyes daily as needed (dry eyes).  . rivaroxaban (XARELTO) 20 MG TABS tablet Take 1 tablet (20 mg total) by mouth daily with supper.  . vitamin B-12 (CYANOCOBALAMIN) 1000 MCG tablet Take 1,000 mcg by mouth  every Sunday.     Allergies:   Morphine and related   Social History   Tobacco Use  . Smoking status: Former Smoker    Packs/day: 1.00    Years: 20.00    Pack years: 20.00    Types: Cigars    Start date: 07/18/1993    Quit date: 10/02/2017    Years since quitting: 1.5  . Smokeless tobacco: Never Used  Substance Use Topics  . Alcohol use: Yes    Alcohol/week: 0.0 standard drinks    Comment: Socially  . Drug use: Never     Family Hx: The patient's family history includes Colon cancer in his brother; Diabetes in his sister; Heart disease in his mother and sister; Hypertension in his sister; Lupus in his sister; Stomach cancer in his sister.  ROS:   Please see the history of present illness.     All other systems reviewed and are negative.   Prior CV studies:  The following studies were reviewed today:  Coronary angiography (12/06/18):   Mid RCA lesion is 90% stenosed.  Dist RCA lesion is 95% stenosed. Post Atrio lesion is 95% stenosed. THere are left to right collaterals filling the distal RCA system.  Ost Cx to Prox Cx lesion is 80% stenosed.  A drug-eluting stent was successfully placed using a STENT SYNERGY DES 2.75X12, dilated to 3.25 mm.  Post intervention, there is a 0% residual stenosis.  Ost 1st Mrg lesion is 80% stenosed.  Scoring balloon angioplasty was performed using a BALLOON WOLVERINE 2.50X10.  Post intervention, there is a 30% residual stenosis.  Mid LAD lesion is 50% stenosed.  Dist LAD lesion is 90% stenosed.  The left ventricular systolic function is normal.  The left ventricular ejection fraction is 50-55% by visual estimate.  LV end diastolic pressure is normal.  There is no aortic valve stenosis.  Hemodynamic findings consistent with mild pulmonary hypertension.  Ao 90%, PA saturation 63%, mean PA 33 mmHg, mean wedge pressure 16 mmHg, Cardiac output 6.9 L/min, cardiac index 3.2  Patient with multivessel disease. However, his  LAD disease is at the apex. He would not benefit from bypass surgery. The RCA is a long diffuse lesion with collaterals. I elected to intervene upon the circumflex since that seemed to supply the most myocardium at risk.  Synergy stent was placed. If he has any bleeding issues, first would stop aspirin before the 1 month recommendation. After that, could consider stopping Plavix before 6 months, but he must complete at least 30 days.  Restart Xarelto tomorrow.  Continue aggressive secondary prevention.  Labs/Other Tests and Data Reviewed:    EKG:  No ECG reviewed.  Recent Labs: 03/22/2019: ALT 15; BUN 11; Creatinine, Ser 1.07; Hemoglobin 8.1; Magnesium 1.8; Platelets 271; Potassium 4.3; Sodium 134   Recent Lipid Panel No results found for: CHOL, TRIG, HDL, CHOLHDL, LDLCALC, LDLDIRECT  Wt Readings from Last 3 Encounters:  04/25/19 200 lb (90.7 kg)  04/19/19 210 lb (95.3 kg)  03/21/19 216 lb 0.8 oz (98 kg)     Objective:    Vital Signs:  BP (!) 155/67   Pulse 83   Ht 5\' 10"  (1.778 m)   Wt 200 lb (90.7 kg)   BMI 28.70 kg/m     ASSESSMENT & PLAN:    1.  Coronary artery disease: Symptomatically stable. He underwent DES to the ostial to proximal left circumflex and balloon angioplasty to the OM1 (residual 30% stenosis) on 12/06/18. It was recommended he stay on Plavix a minimum of 30 days but could consider stopping before 6 months. He remains on Plavix for now. Continue atorvastatin and Imdur. No ASA as he is on Xarelto. After June 06, 2019, I will stop Plavix and start aspirin 81 mg.  2.  Persistent atrial fibrillation: Symptomatically stable. Not on beta blocker therapy due to bradycardia while hospitalized at [facility] in 12/2018. Continue Xarelto.   3.  Anemia: See discussion above regarding GI studies.  He will be receiving IV iron tomorrow.  4.  Hyperlipidemia: Continue atorvastatin.  5.  Hypertension: BP is elevated today but he has not taken his medications yet.  No  changes to therapy.   COVID-19 Education: The signs and symptoms of COVID-19 were discussed with the patient and how to seek care for testing (follow up with PCP or arrange E-visit).  The importance of social distancing was discussed today.  Time:   Today, I have spent 25 minutes with the patient with  telehealth technology discussing the above problems.     Medication Adjustments/Labs and Tests Ordered: Current medicines are reviewed at length with the patient today.  Concerns regarding medicines are outlined above.   Tests Ordered: No orders of the defined types were placed in this encounter.   Medication Changes: No orders of the defined types were placed in this encounter.   Follow Up:  Virtual Visit or In Person in 3 months  Signed, Kate Sable, MD  04/25/2019 10:36 AM    [place]

## 2019-04-26 ENCOUNTER — Ambulatory Visit (HOSPITAL_COMMUNITY): Payer: Medicare PPO | Admitting: Hematology

## 2019-04-26 ENCOUNTER — Encounter (HOSPITAL_COMMUNITY)
Admission: RE | Admit: 2019-04-26 | Discharge: 2019-04-26 | Disposition: A | Discharge status: Home / Self Care | Payer: Medicare PPO | Source: Ambulatory Visit | Attending: Gastroenterology | Admitting: Gastroenterology

## 2019-04-26 ENCOUNTER — Other Ambulatory Visit: Payer: Self-pay

## 2019-04-26 ENCOUNTER — Encounter (HOSPITAL_COMMUNITY): Payer: Self-pay

## 2019-04-26 DIAGNOSIS — D509 Iron deficiency anemia, unspecified: Secondary | ICD-10-CM | POA: Insufficient documentation

## 2019-04-26 MED ORDER — SODIUM CHLORIDE 0.9 % IV SOLN
510.0000 mg | Freq: Once | INTRAVENOUS | Status: AC
  Administered 2019-04-26: 13:00:00 510 mg via INTRAVENOUS
  Filled 2019-04-26: qty 17

## 2019-04-26 MED ORDER — SODIUM CHLORIDE 0.9 % IV SOLN
Freq: Once | INTRAVENOUS | Status: AC
  Administered 2019-04-26: 13:00:00 via INTRAVENOUS

## 2019-04-29 ENCOUNTER — Emergency Department (HOSPITAL_COMMUNITY): Payer: Medicare PPO

## 2019-04-29 ENCOUNTER — Encounter (HOSPITAL_COMMUNITY): Payer: Self-pay | Admitting: *Deleted

## 2019-04-29 ENCOUNTER — Other Ambulatory Visit: Payer: Self-pay

## 2019-04-29 ENCOUNTER — Observation Stay (HOSPITAL_COMMUNITY)
Admission: EM | Admit: 2019-04-29 | Discharge: 2019-04-30 | Disposition: A | Discharge status: Home / Self Care | Payer: Medicare PPO | Attending: Family Medicine | Admitting: Family Medicine

## 2019-04-29 DIAGNOSIS — E119 Type 2 diabetes mellitus without complications: Secondary | ICD-10-CM | POA: Insufficient documentation

## 2019-04-29 DIAGNOSIS — Z85038 Personal history of other malignant neoplasm of large intestine: Secondary | ICD-10-CM | POA: Insufficient documentation

## 2019-04-29 DIAGNOSIS — I2510 Atherosclerotic heart disease of native coronary artery without angina pectoris: Secondary | ICD-10-CM | POA: Diagnosis not present

## 2019-04-29 DIAGNOSIS — Z79899 Other long term (current) drug therapy: Secondary | ICD-10-CM | POA: Insufficient documentation

## 2019-04-29 DIAGNOSIS — R1084 Generalized abdominal pain: Secondary | ICD-10-CM | POA: Insufficient documentation

## 2019-04-29 DIAGNOSIS — I10 Essential (primary) hypertension: Secondary | ICD-10-CM | POA: Diagnosis not present

## 2019-04-29 DIAGNOSIS — Z20828 Contact with and (suspected) exposure to other viral communicable diseases: Secondary | ICD-10-CM | POA: Insufficient documentation

## 2019-04-29 DIAGNOSIS — R079 Chest pain, unspecified: Secondary | ICD-10-CM | POA: Diagnosis present

## 2019-04-29 DIAGNOSIS — E876 Hypokalemia: Principal | ICD-10-CM | POA: Insufficient documentation

## 2019-04-29 DIAGNOSIS — Z87891 Personal history of nicotine dependence: Secondary | ICD-10-CM | POA: Insufficient documentation

## 2019-04-29 DIAGNOSIS — Z96659 Presence of unspecified artificial knee joint: Secondary | ICD-10-CM | POA: Insufficient documentation

## 2019-04-29 DIAGNOSIS — D500 Iron deficiency anemia secondary to blood loss (chronic): Secondary | ICD-10-CM

## 2019-04-29 LAB — COMPREHENSIVE METABOLIC PANEL
ALT: 24 U/L (ref 0–44)
AST: 26 U/L (ref 15–41)
Albumin: 4 g/dL (ref 3.5–5.0)
Alkaline Phosphatase: 70 U/L (ref 38–126)
Anion gap: 11 (ref 5–15)
BUN: 16 mg/dL (ref 8–23)
CO2: 22 mmol/L (ref 22–32)
Calcium: 9.4 mg/dL (ref 8.9–10.3)
Chloride: 98 mmol/L (ref 98–111)
Creatinine, Ser: 1.38 mg/dL — ABNORMAL HIGH (ref 0.61–1.24)
GFR calc Af Amer: 53 mL/min — ABNORMAL LOW (ref >60)
GFR calc non Af Amer: 46 mL/min — ABNORMAL LOW (ref >60)
Glucose, Bld: 184 mg/dL — ABNORMAL HIGH (ref 70–99)
Potassium: 2.8 mmol/L — ABNORMAL LOW (ref 3.5–5.1)
Sodium: 131 mmol/L — ABNORMAL LOW (ref 135–145)
Total Bilirubin: 0.6 mg/dL (ref 0.3–1.2)
Total Protein: 7.4 g/dL (ref 6.5–8.1)

## 2019-04-29 LAB — CBC WITH DIFFERENTIAL/PLATELET
Abs Immature Granulocytes: 0.02 10*3/uL (ref 0.00–0.07)
Basophils Absolute: 0 10*3/uL (ref 0.0–0.1)
Basophils Relative: 1 %
Eosinophils Absolute: 0 10*3/uL (ref 0.0–0.5)
Eosinophils Relative: 1 %
HCT: 34.1 % — ABNORMAL LOW (ref 39.0–52.0)
Hemoglobin: 11.1 g/dL — ABNORMAL LOW (ref 13.0–17.0)
Immature Granulocytes: 0 %
Lymphocytes Relative: 16 %
Lymphs Abs: 0.8 10*3/uL (ref 0.7–4.0)
MCH: 27.5 pg (ref 26.0–34.0)
MCHC: 32.6 g/dL (ref 30.0–36.0)
MCV: 84.6 fL (ref 80.0–100.0)
Monocytes Absolute: 0.7 10*3/uL (ref 0.1–1.0)
Monocytes Relative: 14 %
Neutro Abs: 3.4 10*3/uL (ref 1.7–7.7)
Neutrophils Relative %: 68 %
Platelets: 248 10*3/uL (ref 150–400)
RBC: 4.03 MIL/uL — ABNORMAL LOW (ref 4.22–5.81)
RDW: 25 % — ABNORMAL HIGH (ref 11.5–15.5)
WBC: 5 10*3/uL (ref 4.0–10.5)
nRBC: 0 % (ref 0.0–0.2)

## 2019-04-29 LAB — SARS CORONAVIRUS 2 BY RT PCR (HOSPITAL ORDER, PERFORMED IN ~~LOC~~ HOSPITAL LAB): SARS Coronavirus 2: NEGATIVE

## 2019-04-29 LAB — MAGNESIUM: Magnesium: 1.7 mg/dL (ref 1.7–2.4)

## 2019-04-29 LAB — POC OCCULT BLOOD, ED: Fecal Occult Bld: NEGATIVE

## 2019-04-29 LAB — TROPONIN I (HIGH SENSITIVITY)
Troponin I (High Sensitivity): 9 ng/L (ref <18)
Troponin I (High Sensitivity): 9 ng/L (ref <18)

## 2019-04-29 LAB — PROTIME-INR
INR: 1.5 — ABNORMAL HIGH (ref 0.8–1.2)
Prothrombin Time: 18.1 seconds — ABNORMAL HIGH (ref 11.4–15.2)

## 2019-04-29 LAB — GLUCOSE, CAPILLARY: Glucose-Capillary: 89 mg/dL (ref 70–99)

## 2019-04-29 LAB — HEMOGLOBIN AND HEMATOCRIT, BLOOD
HCT: 33.3 % — ABNORMAL LOW (ref 39.0–52.0)
Hemoglobin: 10.8 g/dL — ABNORMAL LOW (ref 13.0–17.0)

## 2019-04-29 LAB — LIPASE, BLOOD: Lipase: 32 U/L (ref 11–51)

## 2019-04-29 MED ORDER — ATORVASTATIN CALCIUM 40 MG PO TABS
60.0000 mg | ORAL_TABLET | Freq: Every day | ORAL | Status: DC
  Administered 2019-04-30: 60 mg via ORAL
  Filled 2019-04-29: qty 1

## 2019-04-29 MED ORDER — HYDRALAZINE HCL 20 MG/ML IJ SOLN: 10.0000 mg | INTRAMUSCULAR | Status: DC | PRN

## 2019-04-29 MED ORDER — ALBUTEROL SULFATE (2.5 MG/3ML) 0.083% IN NEBU: 2.5000 mg | INHALATION_SOLUTION | Freq: Four times a day (QID) | RESPIRATORY_TRACT | Status: DC | PRN

## 2019-04-29 MED ORDER — PANTOPRAZOLE SODIUM 40 MG PO TBEC: 40.0000 mg | DELAYED_RELEASE_TABLET | Freq: Two times a day (BID) | ORAL | Status: DC

## 2019-04-29 MED ORDER — ACETAMINOPHEN 325 MG PO TABS: 650.0000 mg | ORAL_TABLET | Freq: Four times a day (QID) | ORAL | Status: DC | PRN

## 2019-04-29 MED ORDER — INSULIN ASPART 100 UNIT/ML ~~LOC~~ SOLN: 0.0000 [IU] | SUBCUTANEOUS | Status: DC

## 2019-04-29 MED ORDER — ISOSORBIDE MONONITRATE ER 60 MG PO TB24
60.0000 mg | ORAL_TABLET | Freq: Every day | ORAL | Status: DC
  Administered 2019-04-30: 60 mg via ORAL
  Filled 2019-04-29: qty 1

## 2019-04-29 MED ORDER — PANTOPRAZOLE SODIUM 40 MG IV SOLR
40.0000 mg | Freq: Two times a day (BID) | INTRAVENOUS | Status: DC
  Administered 2019-04-30 (×2): 40 mg via INTRAVENOUS
  Filled 2019-04-29 (×2): qty 40

## 2019-04-29 MED ORDER — PANTOPRAZOLE SODIUM 40 MG IV SOLR
40.0000 mg | Freq: Once | INTRAVENOUS | Status: AC
  Administered 2019-04-29: 40 mg via INTRAVENOUS
  Filled 2019-04-29: qty 40

## 2019-04-29 MED ORDER — ONDANSETRON HCL 4 MG PO TABS: 4.0000 mg | ORAL_TABLET | Freq: Four times a day (QID) | ORAL | Status: DC | PRN

## 2019-04-29 MED ORDER — FENTANYL CITRATE (PF) 100 MCG/2ML IJ SOLN: 25.0000 ug | INTRAMUSCULAR | Status: DC | PRN

## 2019-04-29 MED ORDER — SODIUM CHLORIDE 0.9 % IV SOLN
INTRAVENOUS | Status: DC | PRN
  Administered 2019-04-29: 23:00:00 1000 mL via INTRAVENOUS

## 2019-04-29 MED ORDER — HYDRALAZINE HCL 25 MG PO TABS
100.0000 mg | ORAL_TABLET | Freq: Two times a day (BID) | ORAL | Status: DC
  Administered 2019-04-29 – 2019-04-30 (×2): 100 mg via ORAL
  Filled 2019-04-29 (×2): qty 4

## 2019-04-29 MED ORDER — INSULIN ASPART 100 UNIT/ML ~~LOC~~ SOLN
0.0000 [IU] | SUBCUTANEOUS | Status: DC
  Administered 2019-04-30: 17:00:00 3 [IU] via SUBCUTANEOUS

## 2019-04-29 MED ORDER — NITROGLYCERIN 0.4 MG SL SUBL
0.4000 mg | SUBLINGUAL_TABLET | SUBLINGUAL | Status: DC | PRN
  Administered 2019-04-29: 20:00:00 0.4 mg via SUBLINGUAL
  Filled 2019-04-29: qty 1

## 2019-04-29 MED ORDER — FENTANYL CITRATE (PF) 100 MCG/2ML IJ SOLN
50.0000 ug | Freq: Once | INTRAMUSCULAR | Status: DC
  Filled 2019-04-29: qty 2

## 2019-04-29 MED ORDER — POTASSIUM CHLORIDE CRYS ER 20 MEQ PO TBCR
40.0000 meq | EXTENDED_RELEASE_TABLET | Freq: Once | ORAL | Status: AC
  Administered 2019-04-29: 40 meq via ORAL
  Filled 2019-04-29: qty 2

## 2019-04-29 MED ORDER — FENTANYL CITRATE (PF) 100 MCG/2ML IJ SOLN
25.0000 ug | INTRAMUSCULAR | Status: DC | PRN
  Administered 2019-04-29: 25 ug via INTRAVENOUS

## 2019-04-29 MED ORDER — HYDROCHLOROTHIAZIDE 25 MG PO TABS: 12.5000 mg | ORAL_TABLET | Freq: Every day | ORAL | Status: DC

## 2019-04-29 MED ORDER — LOSARTAN POTASSIUM 50 MG PO TABS: 100.0000 mg | ORAL_TABLET | Freq: Every day | ORAL | Status: DC

## 2019-04-29 MED ORDER — POTASSIUM CHLORIDE 10 MEQ/100ML IV SOLN
10.0000 meq | INTRAVENOUS | Status: AC
  Administered 2019-04-29: 10 meq via INTRAVENOUS
  Filled 2019-04-29: qty 100

## 2019-04-29 MED ORDER — ENSURE ENLIVE PO LIQD: 237.0000 mL | Freq: Two times a day (BID) | ORAL | Status: DC

## 2019-04-29 MED ORDER — IOHEXOL 300 MG/ML  SOLN
100.0000 mL | Freq: Once | INTRAMUSCULAR | Status: AC | PRN
  Administered 2019-04-29: 17:00:00 80 mL via INTRAVENOUS

## 2019-04-29 MED ORDER — ONDANSETRON HCL 4 MG/2ML IJ SOLN: 4.0000 mg | Freq: Four times a day (QID) | INTRAMUSCULAR | Status: DC | PRN

## 2019-04-29 MED ORDER — ACETAMINOPHEN 650 MG RE SUPP: 650.0000 mg | Freq: Four times a day (QID) | RECTAL | Status: DC | PRN

## 2019-04-29 MED ORDER — POLYETHYLENE GLYCOL 3350 17 G PO PACK: 17.0000 g | PACK | Freq: Every day | ORAL | Status: DC | PRN

## 2019-04-29 NOTE — Progress Notes (Addendum)
Patient arrived to room at [facility], during admission process patient c/o chest pain rated at 8. MD notified, orders given and EKG performed and Nitro given SL. Patient's pain down to 5 after one Nitro, Fentanyl 15mcg given IV. O2 placed on patient at 2L [place]. Patient resting comfortably at this time.

## 2019-04-29 NOTE — ED Notes (Signed)
ED TO INPATIENT HANDOFF REPORT  ED Nurse Name and Phone #:   S Name/Age/Gender Antonio Watkins 85 y.o. male Room/Bed: APA09/APA09  Code Status   Code Status: Prior  Home/SNF/Other Triage Complete: Triage complete  Chief Complaint chest pain, sob, black stool  Triage Note Pt with CP earlier today while at rest, denies pain at present. + mild SOB.  Black stools x 2 today.  Recent iron infusion last week.    Allergies Allergies  Allergen Reactions  . Morphine     Other reaction(s): Angioedema    Level of Care/Admitting Diagnosis ED Disposition    ED Disposition Condition [facility] Area: [facility] [[id number]]  Level of Care: Telemetry [5]  Covid Evaluation: Asymptomatic Screening Protocol (No Symptoms)  Diagnosis: Chest pain [858850]  Admitting Physician: Bethena Roys [[id number]]  Attending Physician: Bethena Roys [phone]  PT Class (Do Not Modify): Observation [104]  PT Acc Code (Do Not Modify): Observation [10022]       B Medical/Surgery History Past Medical History:  Diagnosis Date  . Anginal pain ([place])   . Atrial fibrillation ([place])   . CAD (coronary artery disease)   . Cancer of sigmoid colon ([place])    approx 1990  . DM type 2 (diabetes mellitus, type 2) ([place])   . GERD (gastroesophageal reflux disease)   . Gout   . Hiatal hernia   . Hyperlipemia   . Hypertension   . Incontinence    Past Surgical History:  Procedure Laterality Date  . BIOPSY  03/09/2019   Procedure: BIOPSY;  Surgeon: Danie Binder, MD;  [facility];  Service: Endoscopy;;  . cataracts    . CHOLECYSTECTOMY    . COLONOSCOPY     STOPPED BREATHING  . CORONARY STENT INTERVENTION  12/06/2018  . CORONARY STENT INTERVENTION N/A 12/06/2018   Procedure: CORONARY STENT INTERVENTION;  Surgeon: Jettie Booze, MD;  Location: [facility];  Service: Cardiovascular;  Laterality: N/A;  . ESOPHAGOGASTRODUODENOSCOPY N/A 03/09/2019   Procedure:  ESOPHAGOGASTRODUODENOSCOPY (EGD);  Surgeon: Danie Binder, MD;  [facility];  Service: Endoscopy;  Laterality: N/A;  10:30am - pt knows to arrive at 10:15  . ESOPHAGOGASTRODUODENOSCOPY N/A 03/21/2019   Procedure: ESOPHAGOGASTRODUODENOSCOPY (EGD);  Surgeon: Rogene Houston, MD;  [facility];  Service: Endoscopy;  Laterality: N/A;  . GIVENS CAPSULE STUDY N/A 03/22/2019   Procedure: GIVENS CAPSULE STUDY;  Surgeon: Danie Binder, MD;  [facility];  Service: Endoscopy;  Laterality: N/A;  . MINOR CARPAL TUNNEL    . REPLACEMENT TOTAL KNEE    . RIGHT/LEFT HEART CATH AND CORONARY ANGIOGRAPHY N/A 12/06/2018   Procedure: RIGHT/LEFT HEART CATH AND CORONARY ANGIOGRAPHY;  Surgeon: Jettie Booze, MD;  Location: [facility];  Service: Cardiovascular;  Laterality: N/A;     A IV Location/Drains/Wounds Patient Lines/Drains/Airways Status   Active Line/Drains/Airways    Name:   Placement date:   Placement time:   Site:   Days:   Peripheral IV 04/29/19 Left Antecubital   04/29/19    1535    Antecubital   less than 1          Intake/Output Last 24 hours No intake or output data in the 24 hours ending 04/29/19 1901  Labs/Imaging Results for orders placed or performed during the hospital encounter of 04/29/19 (from the past 48 hour(s))  Comprehensive metabolic panel     Status: Abnormal   Collection  Time: 04/29/19  3:44 PM  Result Value Ref Range   Sodium 131 (L) 135 - 145 mmol/L   Potassium 2.8 (L) 3.5 - 5.1 mmol/L   Chloride 98 98 - 111 mmol/L   CO2 22 22 - 32 mmol/L   Glucose, Bld 184 (H) 70 - 99 mg/dL   BUN 16 8 - 23 mg/dL   Creatinine, Ser 1.38 (H) 0.61 - 1.24 mg/dL   Calcium 9.4 8.9 - 10.3 mg/dL   Total Protein 7.4 6.5 - 8.1 g/dL   Albumin 4.0 3.5 - 5.0 g/dL   AST 26 15 - 41 U/L   ALT 24 0 - 44 U/L   Alkaline Phosphatase 70 38 - 126 U/L   Total Bilirubin 0.6 0.3 - 1.2 mg/dL   GFR calc non Af Amer 46 (L) >60 mL/min   GFR calc Af Amer 53 (L)  >60 mL/min   Anion gap 11 5 - 15    Comment: Performed at [facility], [address], [place], [place] [address]  Lipase, blood     Status: None   Collection Time: 04/29/19  3:44 PM  Result Value Ref Range   Lipase 32 11 - 51 U/L    Comment: Performed at [facility], [address], [place], [place] [address]  Troponin I (High Sensitivity)     Status: None   Collection Time: 04/29/19  3:44 PM  Result Value Ref Range   Troponin I (High Sensitivity) 9.00 <18 ng/L    Comment: (NOTE) Elevated high sensitivity troponin I (hsTnI) values and significant  changes across serial measurements may suggest ACS but many other  chronic and acute conditions are known to elevate hsTnI results.  Refer to the "Links" section for chest pain algorithms and additional  guidance. Performed at [facility], [address] [place], [place] [address]   CBC with Differential     Status: Abnormal   Collection Time: 04/29/19  3:44 PM  Result Value Ref Range   WBC 5.0 4.0 - 10.5 K/uL   RBC 4.03 (L) 4.22 - 5.81 MIL/uL   Hemoglobin 11.1 (L) 13.0 - 17.0 g/dL   HCT 34.1 (L) 39.0 - 52.0 %   MCV 84.6 80.0 - 100.0 fL   MCH 27.5 26.0 - 34.0 pg   MCHC 32.6 30.0 - 36.0 g/dL   RDW 25.0 (H) 11.5 - 15.5 %   Platelets 248 150 - 400 K/uL   nRBC 0.0 0.0 - 0.2 %   Neutrophils Relative % 68 %   Neutro Abs 3.4 1.7 - 7.7 K/uL   Lymphocytes Relative 16 %   Lymphs Abs 0.8 0.7 - 4.0 K/uL   Monocytes Relative 14 %   Monocytes Absolute 0.7 0.1 - 1.0 K/uL   Eosinophils Relative 1 %   Eosinophils Absolute 0.0 0.0 - 0.5 K/uL   Basophils Relative 1 %   Basophils Absolute 0.0 0.0 - 0.1 K/uL   Immature Granulocytes 0 %   Abs Immature Granulocytes 0.02 0.00 - 0.07 K/uL    Comment: Performed at [facility], [address], [place], [place] [address]  Protime-INR     Status: Abnormal   Collection Time: 04/29/19  3:44 PM  Result Value Ref Range   Prothrombin Time 18.1 (H) 11.4 - 15.2 seconds   INR 1.5 (H) 0.8 - 1.2    Comment:  (NOTE) INR goal varies based on device and disease states. Performed at [facility], [address], [place], [place] [address]   Magnesium  Status: None   Collection Time: 04/29/19  3:44 PM  Result Value Ref Range   Magnesium 1.7 1.7 - 2.4 mg/dL    Comment: Performed at [facility], [address] [place], [place] [address]  POC occult blood, ED Provider will collect     Status: None   Collection Time: 04/29/19  4:03 PM  Result Value Ref Range   Fecal Occult Bld NEGATIVE NEGATIVE   Ct Abdomen Pelvis W Contrast  Result Date: 04/29/2019 CLINICAL DATA:  Acute generalized abdominal pain beginning today. Melena. EXAM: CT ABDOMEN AND PELVIS WITH CONTRAST TECHNIQUE: Multidetector CT imaging of the abdomen and pelvis was performed using the standard protocol following bolus administration of intravenous contrast. CONTRAST:  24mL OMNIPAQUE IOHEXOL 300 MG/ML  SOLN COMPARISON:  None. FINDINGS: Lower Chest: No acute findings. Bibasilar scarring. Hepatobiliary: Mild caudate hypertrophy and diffuse capsular nodularity, consistent with hepatic cirrhosis. No hepatic masses identified. No evidence of ascites. Prior cholecystectomy. No evidence of biliary obstruction. Pancreas:  No mass or inflammatory changes. Spleen: Within normal limits in size and appearance. Adrenals/Urinary Tract: No masses identified. Tiny cyst noted in lower pole of left kidney. No evidence of hydronephrosis. Stomach/Bowel: No evidence of obstruction, inflammatory process or abnormal fluid collections. Normal appendix visualized. A few diverticula are seen in the proximal sigmoid colon in the area of surgical staples, however there is no evidence of diverticulitis. Vascular/Lymphatic: No pathologically enlarged lymph nodes. No abdominal aortic aneurysm. Aortic atherosclerosis. Reproductive:  No mass or other significant abnormality. Other:  None. Musculoskeletal:  No suspicious bone lesions identified. IMPRESSION: 1. Colonic  diverticulosis, without radiographic evidence of diverticulitis or other acute findings. 2. Hepatic cirrhosis. No evidence of hepatic neoplasm or ascites. Aortic Atherosclerosis (ICD10-I70.0). Electronically Signed   By: Marlaine Hind M.D.   On: 04/29/2019 17:32   Dg Chest Portable 1 View  Result Date: 04/29/2019 CLINICAL DATA:  Episode of chest pain earlier today. Mild shortness of breath. EXAM: PORTABLE CHEST 1 VIEW COMPARISON:  None. FINDINGS: Lungs are clear. Cardiomegaly. No pneumothorax or pleural fluid. No acute or focal bony abnormality IMPRESSION: No acute disease. Cardiomegaly. Electronically Signed   By: Inge Antonio M.D.   On: 04/29/2019 17:32    Pending Labs Unresulted Labs (From admission, onward)    Start     Ordered   04/29/19 1554  SARS Coronavirus 2 (CEPHEID - Performed in [facility] hospital lab), Hosp Order  (Asymptomatic Patients Labs)  Once,   STAT    Question:  Rule Out  Answer:  Yes   04/29/19 1553          Vitals/Pain Today's Vitals   04/29/19 1531 04/29/19 1600 04/29/19 1730 04/29/19 1800  BP:  132/83 (!) 153/78 (!) 171/83  Pulse:  76 98 67  Resp:  (!) 27 13 15   Temp:  98.9 F (37.2 C)    TempSrc:  Oral    SpO2:  100% 97% 100%  Weight: 95.3 kg     Height: 5\' 10"  (1.778 m)     PainSc:        Isolation Precautions No active isolations  Medications Medications  fentaNYL (SUBLIMAZE) injection 50 mcg (50 mcg Intravenous Refused 04/29/19 1607)  potassium chloride 10 mEq in 100 mL IVPB (10 mEq Intravenous New Bag/Given 04/29/19 1745)  pantoprazole (PROTONIX) injection 40 mg (40 mg Intravenous Given 04/29/19 1619)  potassium chloride SA (K-DUR) CR tablet 40 mEq (40 mEq Oral Given 04/29/19 1746)  iohexol (OMNIPAQUE) 300 MG/ML solution 100 mL (80 mLs Intravenous  Contrast Given 04/29/19 1708)    Mobility  Low fall risk   Focused Assessments    R Recommendations: See Admitting Provider Note  Report given to: [place] RN   Additional Notes:

## 2019-04-29 NOTE — ED Triage Notes (Signed)
Pt with CP earlier today while at rest, denies pain at present. + mild SOB.  Black stools x 2 today.  Recent iron infusion last week.

## 2019-04-29 NOTE — H&P (Addendum)
History and Physical    Antonio Watkins QIO:[id number] DOB: 08/31/1930 DOA: 04/29/2019  PCP: Jacqualine Code, DO   Patient coming from: Home  I have personally briefly reviewed patient's old medical records in [place]  Chief Complaint: Chest pain, black stools.   HPI: Antonio Watkins is a 85 y.o. male with medical history significant for coronary artery disease wi`th PCI , hypertension, diabetes mellitus, atrial fibrillation, gout and anemia, patient presented to the ED with complaints of sudden onset of left-sided chest pain that started this morning.  He was sitting down when chest pain started, lasted about 2 to 3 minutes and resolved spontaneously.  He describes pain as sharp and pressure-like.  Chest pain was nonradiating and associated with difficulty breathing.  Patient also had some mid lower chest pain that is worse with movements and coughing. Patient also reports today he had 2 episodes of black stools.  No abdominal pain.  No bloody stools.  No vomiting.  He denies NSAID use. Patient is on Xarelto and compliance.  ED Course: Systolic blood pressure systolic 324M to 010U.  Heart rate 60s to 90s.  High-sensitivity troponin x2 within normal limits.  Hemoglobin stable at 11.  Normal lipase 32.  Creatinine  1.38.  Sodium low 131.  Potassium low 2.8.  Magnesium 1.7.  EKG shows atrial fibrillation, without significant changes from prior.  CT abdomen and pelvis with contrast shows colonic diverticulosis without diverticulitis, and hepatic cirrhosis.  Portable chest x-ray-no acute abnormality.  Hospitalist will admit for possible GI bleed and chest pain.  Review of Systems: As per HPI all other systems reviewed and negative.  Past Medical History:  Diagnosis Date   Anginal pain ([place])    Atrial fibrillation ([place])    CAD (coronary artery disease)    Cancer of sigmoid colon (HCC)    approx 1990   DM type 2 (diabetes mellitus, type 2) ([place])    GERD (gastroesophageal  reflux disease)    Gout    Hiatal hernia    Hyperlipemia    Hypertension    Incontinence     Past Surgical History:  Procedure Laterality Date   BIOPSY  03/09/2019   Procedure: BIOPSY;  Surgeon: Danie Binder, MD;  [facility];  Service: Endoscopy;;   cataracts     CHOLECYSTECTOMY     COLONOSCOPY     STOPPED BREATHING   CORONARY STENT INTERVENTION  12/06/2018   CORONARY STENT INTERVENTION N/A 12/06/2018   Procedure: CORONARY STENT INTERVENTION;  Surgeon: Jettie Booze, MD;  Location: [place] [facility];  Service: Cardiovascular;  Laterality: N/A;   ESOPHAGOGASTRODUODENOSCOPY N/A 03/09/2019   Procedure: ESOPHAGOGASTRODUODENOSCOPY (EGD);  Surgeon: Danie Binder, MD;  [facility];  Service: Endoscopy;  Laterality: N/A;  10:30am - pt knows to arrive at 10:15   ESOPHAGOGASTRODUODENOSCOPY N/A 03/21/2019   Procedure: ESOPHAGOGASTRODUODENOSCOPY (EGD);  Surgeon: Rogene Houston, MD;  [facility];  Service: Endoscopy;  Laterality: N/A;   GIVENS CAPSULE STUDY N/A 03/22/2019   Procedure: GIVENS CAPSULE STUDY;  Surgeon: Danie Binder, MD;  [facility];  Service: Endoscopy;  Laterality: N/A;   MINOR CARPAL TUNNEL     REPLACEMENT TOTAL KNEE     RIGHT/LEFT HEART CATH AND CORONARY ANGIOGRAPHY N/A 12/06/2018   Procedure: RIGHT/LEFT HEART CATH AND CORONARY ANGIOGRAPHY;  Surgeon: Jettie Booze, MD;  Location: [place] [facility];  Service: Cardiovascular;  Laterality: N/A;     reports that he quit  smoking about 18 months ago. His smoking use included cigars. He started smoking about 25 years ago. He has a 20.00 pack-year smoking history. He has never used smokeless tobacco. He reports current alcohol use. He reports that he does not use drugs.  Allergies  Allergen Reactions   Morphine     Other reaction(s): Angioedema    Family History  Problem Relation Age of Onset   Heart disease Mother    Heart disease Brother      Diabetes Sister    Colon cancer Brother    Stomach cancer Sister    Lupus Sister    Heart disease Sister     Prior to Admission medications   Medication Sig Start Date End Date Taking? Authorizing Provider  albuterol (PROVENTIL HFA;VENTOLIN HFA) 108 (90 Base) MCG/ACT inhaler Inhale 1 puff into the lungs every 6 (six) hours as needed for wheezing or shortness of breath. 01/23/19  Yes Branch, Alphonse Guild, MD  atorvastatin (LIPITOR) 40 MG tablet TAKE 1 AND 1/2 TABLETS EVERY DAY Patient taking differently: Take 60 mg by mouth daily.  12/05/18  Yes Herminio Commons, MD  docusate sodium (DOK) 100 MG capsule Take 100 mg by mouth 2 (two) times daily.    Yes [provider]  FEROSUL 325 (65 Fe) MG tablet Take 325 mg by mouth 2 (two) times daily with a meal. for 7 days 04/03/19  Yes [provider]  hydrALAZINE (APRESOLINE) 100 MG tablet TAKE 1 TABLET THREE TIMES DAILY ( DOSE INCREASE ) Patient taking differently: Take 100 mg by mouth 2 (two) times daily. Pt supposed to take TID but only takes BID 07/31/18  Yes Herminio Commons, MD  hydrochlorothiazide (HYDRODIURIL) 12.5 MG tablet Take 12.5 mg by mouth daily with breakfast.    Yes [provider]  isosorbide mononitrate (IMDUR) 60 MG 24 hr tablet Take 60 mg by mouth daily.   Yes [provider]  losartan (COZAAR) 100 MG tablet Take 100 mg by mouth daily.   Yes [provider]  magnesium hydroxide (MILK OF MAGNESIA) 400 MG/5ML suspension Take 15 mLs by mouth daily as needed for mild constipation.   Yes [provider]  Multiple Vitamin (MULTIVITAMIN WITH MINERALS) TABS tablet Take 1 tablet by mouth daily. Centrum Silver   Yes [provider]  neomycin-polymyxin-hydrocortisone (CORTISPORIN) 3.5-10000-1 OTIC suspension Place into the right ear as needed. Supposed to be TID but pt only takes prn   Yes [provider]  Omega-3 Fatty Acids (OMEGA-3 FISH OIL) 1200 MG CAPS Take  1,200 mg by mouth 2 (two) times a day.    Yes [provider]  pantoprazole (PROTONIX) 40 MG tablet Take 1 tablet (40 mg total) by mouth 2 (two) times daily. 03/22/19 06/20/19 Yes Sheikh, Omair Latif, DO  Polyethyl Glycol-Propyl Glycol (SYSTANE ULTRA OP) Place 1 drop into both eyes daily as needed (dry eyes).   Yes [provider]  rivaroxaban (XARELTO) 20 MG TABS tablet Take 1 tablet (20 mg total) by mouth daily with supper. 12/25/18  Yes Kilroy, Luke K, PA-C  vitamin B-12 (CYANOCOBALAMIN) 1000 MCG tablet Take 1,000 mcg by mouth every Sunday.   Yes [provider]  nitroGLYCERIN (NITROSTAT) 0.4 MG SL tablet DISSOLVE 1 TABLET UNDER THE TONGUE EVERY 5 MINUTES AS NEEDED FOR CHEST PAIN 12/07/18   Darreld Mclean, PA-C    Physical Exam: Vitals:   04/29/19 1951 04/29/19 1959 04/29/19 2032 04/29/19 2046  BP:  (!) 179/93 (!) 184/95 (!) 184/95  Pulse:  67 69 62  Resp:  20 (!) 24   Temp:  98.8 F (37.1 C)    TempSrc:  Oral    SpO2: 99% 98% 100% 100%  Weight:      Height:        Constitutional: NAD, calm, comfortable Vitals:   04/29/19 1951 04/29/19 1959 04/29/19 2032 04/29/19 2046  BP:  (!) 179/93 (!) 184/95 (!) 184/95  Pulse:  67 69 62  Resp:  20 (!) 24   Temp:  98.8 F (37.1 C)    TempSrc:  Oral    SpO2: 99% 98% 100% 100%  Weight:      Height:       Eyes: PERRL, lids and conjunctivae normal ENMT: Mucous membranes are moist. Posterior pharynx clear of any exudate or lesions.Normal dentition.  Neck: normal, supple, no masses, no thyromegaly Respiratory: clear to auscultation bilaterally, no wheezing, no crackles. Normal respiratory effort. No accessory muscle use.  Cardiovascular: Regular rate and rhythm, no murmurs / rubs / gallops. No extremity edema. 2+ pedal pulses.  Abdomen: no tenderness, no masses palpated. No hepatosplenomegaly. Bowel sounds positive.  Musculoskeletal: no clubbing / cyanosis. No joint deformity upper and lower extremities. Good ROM, no  contractures. Normal muscle tone.  Skin: no rashes, lesions, ulcers. No induration Neurologic: CN 2-12 grossly intact. Strength 5/5 in all 4.  Psychiatric: Normal judgment and insight. Alert and oriented x 3. Normal mood.   Labs on Admission: I have personally reviewed following labs and imaging studies  CBC: Recent Labs  Lab 04/29/19 1544  WBC 5.0  NEUTROABS 3.4  HGB 11.1*  HCT 34.1*  MCV 84.6  PLT 412   Basic Metabolic Panel: Recent Labs  Lab 04/29/19 1544  NA 131*  K 2.8*  CL 98  CO2 22  GLUCOSE 184*  BUN 16  CREATININE 1.38*  CALCIUM 9.4  MG 1.7   Liver Function Tests: Recent Labs  Lab 04/29/19 1544  AST 26  ALT 24  ALKPHOS 70  BILITOT 0.6  PROT 7.4  ALBUMIN 4.0   Recent Labs  Lab 04/29/19 1544  LIPASE 32   Coagulation Profile: Recent Labs  Lab 04/29/19 1544  INR 1.5*   CBG: Recent Labs  Lab 04/29/19 2203  GLUCAP 89    Radiological Exams on Admission: Ct Abdomen Pelvis W Contrast  Result Date: 04/29/2019 CLINICAL DATA:  Acute generalized abdominal pain beginning today. Melena. EXAM: CT ABDOMEN AND PELVIS WITH CONTRAST TECHNIQUE: Multidetector CT imaging of the abdomen and pelvis was performed using the standard protocol following bolus administration of intravenous contrast. CONTRAST:  51mL OMNIPAQUE IOHEXOL 300 MG/ML  SOLN COMPARISON:  None. FINDINGS: Lower Chest: No acute findings. Bibasilar scarring. Hepatobiliary: Mild caudate hypertrophy and diffuse capsular nodularity, consistent with hepatic cirrhosis. No hepatic masses identified. No evidence of ascites. Prior cholecystectomy. No evidence of biliary obstruction. Pancreas:  No mass or inflammatory changes. Spleen: Within normal limits in size and appearance. Adrenals/Urinary Tract: No masses identified. Tiny cyst noted in lower pole of left kidney. No evidence of hydronephrosis. Stomach/Bowel: No evidence of obstruction, inflammatory process or abnormal fluid collections. Normal appendix  visualized. A few diverticula are seen in the proximal sigmoid colon in the area of surgical staples, however there is no evidence of diverticulitis. Vascular/Lymphatic: No pathologically enlarged lymph nodes. No abdominal aortic aneurysm. Aortic atherosclerosis. Reproductive:  No mass or other significant abnormality. Other:  None. Musculoskeletal:  No suspicious bone lesions identified. IMPRESSION: 1. Colonic diverticulosis, without radiographic evidence of diverticulitis  or other acute findings. 2. Hepatic cirrhosis. No evidence of hepatic neoplasm or ascites. Aortic Atherosclerosis (ICD10-I70.0). Electronically Signed   By: Marlaine Hind M.D.   On: 04/29/2019 17:32   Dg Chest Portable 1 View  Result Date: 04/29/2019 CLINICAL DATA:  Episode of chest pain earlier today. Mild shortness of breath. EXAM: PORTABLE CHEST 1 VIEW COMPARISON:  None. FINDINGS: Lungs are clear. Cardiomegaly. No pneumothorax or pleural fluid. No acute or focal bony abnormality IMPRESSION: No acute disease. Cardiomegaly. Electronically Signed   By: Inge Rise M.D.   On: 04/29/2019 17:32    EKG: Independently reviewed.  Atrial fibrillation.  No significant ST or T wave abnormalities compared to prior.  Assessment/Plan Active Problems:   Chest pain   Melena-2 episodes. On anticoagulation with Xarelto.  Hemoglobin 11, increased from recent baseline ~8.  Recent endoscopy 03/21/2019 by Dr. Laural Golden- Non-bleeding superficial gastric ulcer at fundus resulting from [facility] ablation of AVM. Non-bleeding gastric ulcer with adherent clot at gastric body resulting from [facility] ablation of AVM. Non-bleeding gastric ulcers with no stigmata of bleeding resulting from [facility] ablation of AVMs. -Monitor hemoglobin closely. -Hold Xarelto for now. - GI consult  Chest pain CAD history-atypical chest pain, high risk patient. With history of stent placement-12/06/2018.Marland Kitchen Follows with Dr. Harl Bowie.  He is supposed to be on Plavix till August and Xarelto.  But  Plavix is not on his home medication list at this time.  HIgh-sensitivity troponin x2.  EKG without significant changes compared to prior.  Arrival to floor patient started having chest pain, which improved with nitro. -EKG a.m. -Echocardiogram -Cardiology consult -Nitro PRN, IV fentanyl(morphine allergy) -Continue home Imdur, atorvastatin  Hypokalemia-2.8.  Magnesium normal 1.7.  Likely from diuretics. -Replete - BMP a.m  Atrial fibrillation-rate controlled, and anticoagulated.  Not on rate limiting medications. -Hold Xarelto for now.  Diabetes mellitus- random glucose 184.  Not on medications. - SSI  Hypertension-elevated -Continue home hydralazine, Imdur -Hold losartan for now and HCTZ with contrast exposure -PRN IV hydralazine  Hyponatremia-sodium 131.  Close to baseline 134-135.  Likely from diuretics, considering hypo kalemia also..  Abdominal CT suggests liver cirrhosis. -BMP a.m. -Hold HCTZ for now   DVT prophylaxis: Scds Code Status: Full Family Communication: None at bedside Disposition Plan: 1-2 days Consults called: Cardiology Admission status: Obs, tele I certify that at the point of admission it is my clinical judgment that the patient will require inpatient hospital care spanning beyond 2 midnights from the point of admission due to high intensity of service, high risk for further deterioration and high frequency of surveillance required. The following factors support the patient status of inpatient:    Bethena Roys MD [facility]  04/29/2019, 10:32 PM

## 2019-04-29 NOTE — ED Notes (Signed)
Updated pt's niece Vaughan Basta

## 2019-04-29 NOTE — ED Notes (Signed)
Attempted to call pt's niece x 2 and wife x 1 with no answer.  Left message on nieces vm

## 2019-04-29 NOTE — ED Provider Notes (Signed)
[facility] EMERGENCY DEPARTMENT Provider Note   CSN: [id number] Arrival date & time: 04/29/19  1521    History   Chief Complaint Chief Complaint  Patient presents with   Chest Pain    HPI Antonio Watkins is a 85 y.o. male.     HPI  85 year old male presents with chest pain and black stools.  He states that this morning he had a bowel movement like he typically does but it was dark and almost black.  Typically his stools are brown.  There was not soft or changed in quality.  He had another black stool as well.  He is concerned about GI bleeding.  He is also noticed chest pain that started shortly after the first bowel movement.  It is in his inferior chest in the midline.  Sometimes sitting up seems to make it a little worse.  Chronic bilateral ankle swelling that is unchanged.  The chest pain is much improved after he took some nitroglycerin though he does not feel the chest pain feels quite like his prior coronary pain.  He is also having some right upper quadrant abdominal spasms since he got to the hospital.  Feels somewhat short of breath.  Past Medical History:  Diagnosis Date   Anginal pain ([place])    Atrial fibrillation ([place])    CAD (coronary artery disease)    Cancer of sigmoid colon ([place])    approx 1990   DM type 2 (diabetes mellitus, type 2) ([place])    GERD (gastroesophageal reflux disease)    Gout    Hiatal hernia    Hyperlipemia    Hypertension    Incontinence     Patient Active Problem List   Diagnosis Date Noted   Melena 02/27/2019   Symptomatic anemia 02/27/2019   Chronic anticoagulation 12/25/2018   Essential hypertension 12/07/2018   Type 2 diabetes mellitus ([place]) 12/07/2018   Atrial fibrillation, persistent 12/07/2018   Angina pectoris ([place]) 12/06/2018   DOE (dyspnea on exertion)    Dyslipidemia, goal LDL below 70 08/04/2009   CAD- S/P PCI 08/04/2009    Past Surgical History:  Procedure Laterality Date   BIOPSY  03/09/2019   Procedure: BIOPSY;  Surgeon: Danie Binder, MD;  [facility];  Service: Endoscopy;;   cataracts     CHOLECYSTECTOMY     COLONOSCOPY     STOPPED BREATHING   CORONARY STENT INTERVENTION  12/06/2018   CORONARY STENT INTERVENTION N/A 12/06/2018   Procedure: CORONARY STENT INTERVENTION;  Surgeon: Jettie Booze, MD;  Location: [place] [facility] LAB;  Service: Cardiovascular;  Laterality: N/A;   ESOPHAGOGASTRODUODENOSCOPY N/A 03/09/2019   Procedure: ESOPHAGOGASTRODUODENOSCOPY (EGD);  Surgeon: Danie Binder, MD;  Location: [facility];  Service: Endoscopy;  Laterality: N/A;  10:30am - pt knows to arrive at 10:15   ESOPHAGOGASTRODUODENOSCOPY N/A 03/21/2019   Procedure: ESOPHAGOGASTRODUODENOSCOPY (EGD);  Surgeon: Rogene Houston, MD;  Location: [facility];  Service: Endoscopy;  Laterality: N/A;   GIVENS CAPSULE STUDY N/A 03/22/2019   Procedure: GIVENS CAPSULE STUDY;  Surgeon: Danie Binder, MD;  Location: [facility];  Service: Endoscopy;  Laterality: N/A;   MINOR CARPAL TUNNEL     REPLACEMENT TOTAL KNEE     RIGHT/LEFT HEART CATH AND CORONARY ANGIOGRAPHY N/A 12/06/2018   Procedure: RIGHT/LEFT HEART CATH AND CORONARY ANGIOGRAPHY;  Surgeon: Jettie Booze, MD;  Location: [place] [facility];  Service: Cardiovascular;  Laterality: N/A;        Home Medications  Prior to Admission medications   Medication Sig Start Date End Date Taking? Authorizing Provider  albuterol (PROVENTIL HFA;VENTOLIN HFA) 108 (90 Base) MCG/ACT inhaler Inhale 1 puff into the lungs every 6 (six) hours as needed for wheezing or shortness of breath. 01/23/19  Yes Branch, Alphonse Guild, MD  atorvastatin (LIPITOR) 40 MG tablet TAKE 1 AND 1/2 TABLETS EVERY DAY Patient taking differently: Take 60 mg by mouth daily.  12/05/18  Yes Herminio Commons, MD  docusate sodium (DOK) 100 MG capsule Take 100 mg by mouth 2 (two) times daily.    Yes [provider]  FEROSUL 325 (65 Fe) MG tablet Take  325 mg by mouth 2 (two) times daily with a meal. for 7 days 04/03/19  Yes [provider]  hydrALAZINE (APRESOLINE) 100 MG tablet TAKE 1 TABLET THREE TIMES DAILY ( DOSE INCREASE ) Patient taking differently: Take 100 mg by mouth 2 (two) times daily. Pt supposed to take TID but only takes BID 07/31/18  Yes Herminio Commons, MD  hydrochlorothiazide (HYDRODIURIL) 12.5 MG tablet Take 12.5 mg by mouth daily with breakfast.    Yes [provider]  isosorbide mononitrate (IMDUR) 60 MG 24 hr tablet Take 60 mg by mouth daily.   Yes [provider]  losartan (COZAAR) 100 MG tablet Take 100 mg by mouth daily.   Yes [provider]  magnesium hydroxide (MILK OF MAGNESIA) 400 MG/5ML suspension Take 15 mLs by mouth daily as needed for mild constipation.   Yes [provider]  Multiple Vitamin (MULTIVITAMIN WITH MINERALS) TABS tablet Take 1 tablet by mouth daily. Centrum Silver   Yes [provider]  neomycin-polymyxin-hydrocortisone (CORTISPORIN) 3.5-10000-1 OTIC suspension Place into the right ear as needed. Supposed to be TID but pt only takes prn   Yes [provider]  Omega-3 Fatty Acids (OMEGA-3 FISH OIL) 1200 MG CAPS Take 1,200 mg by mouth 2 (two) times a day.    Yes [provider]  pantoprazole (PROTONIX) 40 MG tablet Take 1 tablet (40 mg total) by mouth 2 (two) times daily. 03/22/19 06/20/19 Yes Sheikh, Omair Latif, DO  Polyethyl Glycol-Propyl Glycol (SYSTANE ULTRA OP) Place 1 drop into both eyes daily as needed (dry eyes).   Yes [provider]  rivaroxaban (XARELTO) 20 MG TABS tablet Take 1 tablet (20 mg total) by mouth daily with supper. 12/25/18  Yes Kilroy, Luke K, PA-C  vitamin B-12 (CYANOCOBALAMIN) 1000 MCG tablet Take 1,000 mcg by mouth every Sunday.   Yes [provider]  nitroGLYCERIN (NITROSTAT) 0.4 MG SL tablet DISSOLVE 1 TABLET UNDER THE TONGUE EVERY 5 MINUTES AS NEEDED FOR CHEST PAIN 12/07/18   Darreld Mclean, PA-C    Family History Family History  Problem Relation Age of Onset   Heart disease Mother    Heart disease Brother    Diabetes Sister    Colon cancer Brother    Stomach cancer Sister    Lupus Sister    Heart disease Sister     Social History Social History   Tobacco Use   Smoking status: Former Smoker    Packs/day: 1.00    Years: 20.00    Pack years: 20.00    Types: Cigars    Start date: 07/18/1993    Quit date: 10/02/2017    Years since quitting: 1.5   Smokeless tobacco: Never Used  Substance Use Topics   Alcohol use: Yes    Alcohol/week: 0.0 standard drinks    Comment: Socially  Drug use: Never     Allergies   Morphine   Review of Systems Review of Systems  Constitutional: Negative for fever.  Respiratory: Positive for shortness of breath. Negative for cough.   Cardiovascular: Positive for chest pain and leg swelling (chronic, ankles).  Gastrointestinal: Positive for abdominal pain and blood in stool. Negative for diarrhea and vomiting.  All other systems reviewed and are negative.    Physical Exam Updated Vital Signs BP (!) 171/83    Pulse 67    Temp 98.9 F (37.2 C) (Oral)    Resp 15    Ht 5\' 10"  (1.778 m)    Wt 95.3 kg    SpO2 100%    BMI 30.13 kg/m   Physical Exam Vitals signs and nursing note reviewed.  Constitutional:      General: He is not in acute distress.    Appearance: He is well-developed. He is not ill-appearing or diaphoretic.  HENT:     Head: Normocephalic and atraumatic.     Right Ear: External ear normal.     Left Ear: External ear normal.     Nose: Nose normal.  Eyes:     General:        Right eye: No discharge.        Left eye: No discharge.  Neck:     Musculoskeletal: Neck supple.  Cardiovascular:     Rate and Rhythm: Normal rate and regular rhythm.     Heart sounds: Normal heart sounds.  Pulmonary:     Effort: Pulmonary effort is normal.     Breath sounds: Normal breath sounds.  Abdominal:      Palpations: Abdomen is soft.     Tenderness: There is abdominal tenderness in the epigastric area.    Genitourinary:    Rectum: No external hemorrhoid or internal hemorrhoid.     Comments: Mild dark stool on DRE. No gross blood Musculoskeletal:     Right lower leg: Edema present.     Left lower leg: Edema present.     Comments: Trace ankle edema bilaterally  Skin:    General: Skin is warm and dry.  Neurological:     Mental Status: He is alert.  Psychiatric:        Mood and Affect: Mood is not anxious.      ED Treatments / Results  Labs (all labs ordered are listed, but only abnormal results are displayed) Labs Reviewed  COMPREHENSIVE METABOLIC PANEL - Abnormal; Notable for the following components:      Result Value   Sodium 131 (*)    Potassium 2.8 (*)    Glucose, Bld 184 (*)    Creatinine, Ser 1.38 (*)    GFR calc non Af Amer 46 (*)    GFR calc Af Amer 53 (*)    All other components within normal limits  CBC WITH DIFFERENTIAL/PLATELET - Abnormal; Notable for the following components:   RBC 4.03 (*)    Hemoglobin 11.1 (*)    HCT 34.1 (*)    RDW 25.0 (*)    All other components within normal limits  PROTIME-INR - Abnormal; Notable for the following components:   Prothrombin Time 18.1 (*)    INR 1.5 (*)    All other components within normal limits  SARS CORONAVIRUS 2 (HOSPITAL ORDER, [place] [facility])  LIPASE, BLOOD  MAGNESIUM  POC OCCULT BLOOD, ED  TROPONIN I (HIGH SENSITIVITY)  TROPONIN I (HIGH SENSITIVITY)    EKG EKG Interpretation  Date/Time:  Sunday April 29 2019 16:01:01 EDT Ventricular Rate:  78 PR Interval:    QRS Duration: 92 QT Interval:  542 QTC Calculation: 618 R Axis:   -37 Text Interpretation:  Atrial fibrillation Left axis deviation Borderline repolarization abnormality Prolonged QT interval ST/T changes similar to March 21 2019 though new since Feb 2020 Confirmed by Sherwood Gambler [phone]) on 04/29/2019 4:15:17  PM   Radiology Ct Abdomen Pelvis W Contrast  Result Date: 04/29/2019 CLINICAL DATA:  Acute generalized abdominal pain beginning today. Melena. EXAM: CT ABDOMEN AND PELVIS WITH CONTRAST TECHNIQUE: Multidetector CT imaging of the abdomen and pelvis was performed using the standard protocol following bolus administration of intravenous contrast. CONTRAST:  92mL OMNIPAQUE IOHEXOL 300 MG/ML  SOLN COMPARISON:  None. FINDINGS: Lower Chest: No acute findings. Bibasilar scarring. Hepatobiliary: Mild caudate hypertrophy and diffuse capsular nodularity, consistent with hepatic cirrhosis. No hepatic masses identified. No evidence of ascites. Prior cholecystectomy. No evidence of biliary obstruction. Pancreas:  No mass or inflammatory changes. Spleen: Within normal limits in size and appearance. Adrenals/Urinary Tract: No masses identified. Tiny cyst noted in lower pole of left kidney. No evidence of hydronephrosis. Stomach/Bowel: No evidence of obstruction, inflammatory process or abnormal fluid collections. Normal appendix visualized. A few diverticula are seen in the proximal sigmoid colon in the area of surgical staples, however there is no evidence of diverticulitis. Vascular/Lymphatic: No pathologically enlarged lymph nodes. No abdominal aortic aneurysm. Aortic atherosclerosis. Reproductive:  No mass or other significant abnormality. Other:  None. Musculoskeletal:  No suspicious bone lesions identified. IMPRESSION: 1. Colonic diverticulosis, without radiographic evidence of diverticulitis or other acute findings. 2. Hepatic cirrhosis. No evidence of hepatic neoplasm or ascites. Aortic Atherosclerosis (ICD10-I70.0). Electronically Signed   By: Marlaine Hind M.D.   On: 04/29/2019 17:32   Dg Chest Portable 1 View  Result Date: 04/29/2019 CLINICAL DATA:  Episode of chest pain earlier today. Mild shortness of breath. EXAM: PORTABLE CHEST 1 VIEW COMPARISON:  None. FINDINGS: Lungs are clear. Cardiomegaly. No pneumothorax  or pleural fluid. No acute or focal bony abnormality IMPRESSION: No acute disease. Cardiomegaly. Electronically Signed   By: Inge Rise M.D.   On: 04/29/2019 17:32    Procedures Procedures (including critical care time)  Medications Ordered in ED Medications  fentaNYL (SUBLIMAZE) injection 50 mcg (50 mcg Intravenous Refused 04/29/19 1607)  potassium chloride 10 mEq in 100 mL IVPB (10 mEq Intravenous New Bag/Given 04/29/19 1745)  pantoprazole (PROTONIX) injection 40 mg (40 mg Intravenous Given 04/29/19 1619)  potassium chloride SA (K-DUR) CR tablet 40 mEq (40 mEq Oral Given 04/29/19 1746)  iohexol (OMNIPAQUE) 300 MG/ML solution 100 mL (80 mLs Intravenous Contrast Given 04/29/19 1708)     Initial Impression / Assessment and Plan / ED Course  I have reviewed the triage vital signs and the nursing notes.  Pertinent labs & imaging results that were available during my care of the patient were reviewed by me and considered in my medical decision making (see chart for details).        Unclear if this is chest pain or upper abdominal pain.  Initial troponin negative.  CT and chest x-ray are benign.  He reports dark stools though technically these are Hemoccult negative.  His initial hemoglobin is okay.  He does have a significant coronary history and I think he will need observation given the atypical chest pain.  He is also noted to have moderate hypokalemia which is probably causing the spasms he is intermittently having.  He has  a prolonged QTC on ECG and so he will get IV and oral potassium.  Magnesium is okay.  Will observe with the hospitalist service.  Derold Dorsch was evaluated in Emergency Department on 04/29/2019 for the symptoms described in the history of present illness. He was evaluated in the context of the global COVID-19 pandemic, which necessitated consideration that the patient might be at risk for infection with the SARS-CoV-2 virus that causes COVID-19. Institutional  protocols and algorithms that pertain to the evaluation of patients at risk for COVID-19 are in a state of rapid change based on information released by regulatory bodies including the CDC and federal and state organizations. These policies and algorithms were followed during the patient's care in the ED.   Final Clinical Impressions(s) / ED Diagnoses   Final diagnoses:  Hypokalemia  Nonspecific chest pain    ED Discharge Orders    None       Sherwood Gambler, MD 04/29/19 [phone]

## 2019-04-30 ENCOUNTER — Observation Stay (HOSPITAL_BASED_OUTPATIENT_CLINIC_OR_DEPARTMENT_OTHER): Payer: Medicare PPO

## 2019-04-30 ENCOUNTER — Encounter (HOSPITAL_COMMUNITY): Payer: Self-pay | Admitting: Gastroenterology

## 2019-04-30 DIAGNOSIS — E876 Hypokalemia: Secondary | ICD-10-CM | POA: Diagnosis not present

## 2019-04-30 DIAGNOSIS — Z8719 Personal history of other diseases of the digestive system: Secondary | ICD-10-CM

## 2019-04-30 DIAGNOSIS — I10 Essential (primary) hypertension: Secondary | ICD-10-CM

## 2019-04-30 DIAGNOSIS — D500 Iron deficiency anemia secondary to blood loss (chronic): Secondary | ICD-10-CM

## 2019-04-30 DIAGNOSIS — R079 Chest pain, unspecified: Secondary | ICD-10-CM

## 2019-04-30 DIAGNOSIS — R1084 Generalized abdominal pain: Secondary | ICD-10-CM | POA: Diagnosis not present

## 2019-04-30 DIAGNOSIS — I4891 Unspecified atrial fibrillation: Secondary | ICD-10-CM | POA: Diagnosis not present

## 2019-04-30 DIAGNOSIS — I25118 Atherosclerotic heart disease of native coronary artery with other forms of angina pectoris: Secondary | ICD-10-CM

## 2019-04-30 DIAGNOSIS — K921 Melena: Secondary | ICD-10-CM | POA: Diagnosis not present

## 2019-04-30 DIAGNOSIS — D509 Iron deficiency anemia, unspecified: Secondary | ICD-10-CM

## 2019-04-30 DIAGNOSIS — E119 Type 2 diabetes mellitus without complications: Secondary | ICD-10-CM | POA: Diagnosis not present

## 2019-04-30 LAB — ECHOCARDIOGRAM COMPLETE
Height: 70 in
Weight: 3360 oz

## 2019-04-30 LAB — CBC
HCT: 33.5 % — ABNORMAL LOW (ref 39.0–52.0)
Hemoglobin: 10.9 g/dL — ABNORMAL LOW (ref 13.0–17.0)
MCH: 27.9 pg (ref 26.0–34.0)
MCHC: 32.5 g/dL (ref 30.0–36.0)
MCV: 85.7 fL (ref 80.0–100.0)
Platelets: 222 10*3/uL (ref 150–400)
RBC: 3.91 MIL/uL — ABNORMAL LOW (ref 4.22–5.81)
RDW: 25.8 % — ABNORMAL HIGH (ref 11.5–15.5)
WBC: 4.9 10*3/uL (ref 4.0–10.5)
nRBC: 0 % (ref 0.0–0.2)

## 2019-04-30 LAB — GLUCOSE, CAPILLARY
Glucose-Capillary: 101 mg/dL — ABNORMAL HIGH (ref 70–99)
Glucose-Capillary: 111 mg/dL — ABNORMAL HIGH (ref 70–99)
Glucose-Capillary: 113 mg/dL — ABNORMAL HIGH (ref 70–99)
Glucose-Capillary: 131 mg/dL — ABNORMAL HIGH (ref 70–99)
Glucose-Capillary: 204 mg/dL — ABNORMAL HIGH (ref 70–99)

## 2019-04-30 LAB — BASIC METABOLIC PANEL
Anion gap: 11 (ref 5–15)
BUN: 13 mg/dL (ref 8–23)
CO2: 23 mmol/L (ref 22–32)
Calcium: 9.3 mg/dL (ref 8.9–10.3)
Chloride: 101 mmol/L (ref 98–111)
Creatinine, Ser: 1.17 mg/dL (ref 0.61–1.24)
GFR calc Af Amer: >60 mL/min (ref >60)
GFR calc non Af Amer: 56 mL/min — ABNORMAL LOW (ref >60)
Glucose, Bld: 108 mg/dL — ABNORMAL HIGH (ref 70–99)
Potassium: 3.8 mmol/L (ref 3.5–5.1)
Sodium: 135 mmol/L (ref 135–145)

## 2019-04-30 MED ORDER — SENNOSIDES-DOCUSATE SODIUM 8.6-50 MG PO TABS: 2.0000 | ORAL_TABLET | Freq: Every day | ORAL | 1 refills | Status: DC

## 2019-04-30 MED ORDER — RIVAROXABAN 20 MG PO TABS: 20.0000 mg | ORAL_TABLET | Freq: Every day | ORAL | 3 refills | Status: DC

## 2019-04-30 MED ORDER — BOOST / RESOURCE BREEZE PO LIQD CUSTOM
1.0000 | Freq: Three times a day (TID) | ORAL | Status: DC
  Administered 2019-04-30: 1 via ORAL

## 2019-04-30 MED ORDER — PANTOPRAZOLE SODIUM 40 MG PO TBEC: 40.0000 mg | DELAYED_RELEASE_TABLET | Freq: Two times a day (BID) | ORAL | 3 refills | Status: DC

## 2019-04-30 MED ORDER — ACETAMINOPHEN 325 MG PO TABS: 650.0000 mg | ORAL_TABLET | Freq: Four times a day (QID) | ORAL | 0 refills | Status: DC | PRN

## 2019-04-30 NOTE — Discharge Summary (Signed)
Antonio Watkins, is a 85 y.o. male  DOB 1930-10-06  MRN [id number].  Admission date:  04/29/2019  Admitting Physician  Bethena Roys, MD  Discharge Date:  04/30/2019   Primary MD  Jacqualine Code, DO  Recommendations for primary care physician for things to follow:  1) you are taking Xarelto/rivaroxaban which is a blood thinner so Avoid ibuprofen/Advil/Aleve/Motrin/Goody Powders/Naproxen/BC powders/Meloxicam/Diclofenac/Indomethacin and other Nonsteroidal anti-inflammatory medications as these will make you more likely to bleed and can cause stomach ulcers, can also cause Kidney problems.   2) okay to restart Xarelto on Wednesday, 05/02/2019 if no further bleeding  3) no need to take aspirin and no need to take Plavix anymore  4) follow-up to primary care physician within a week for repeat CBC/complete blood count test  5) please call or return if any concerns about bleeding   Admission Diagnosis  Hypokalemia [E87.6] Nonspecific chest pain [R07.9]   Discharge Diagnosis  Hypokalemia [E87.6] Nonspecific chest pain [R07.9]    Active Problems:   Chest pain   Iron deficiency anemia due to chronic blood loss      Past Medical History:  Diagnosis Date   Anginal pain ([place])    Atrial fibrillation ([place])    CAD (coronary artery disease)    Cancer of sigmoid colon ([place])    approx 1990   DM type 2 (diabetes mellitus, type 2) ([place])    GERD (gastroesophageal reflux disease)    Gout    Hiatal hernia    Hyperlipemia    Hypertension    Incontinence     Past Surgical History:  Procedure Laterality Date   BIOPSY  03/09/2019   Procedure: BIOPSY;  Surgeon: Danie Binder, MD;  [facility];  Service: Endoscopy;;   cataracts     CHOLECYSTECTOMY     COLONOSCOPY     STOPPED BREATHING   CORONARY STENT INTERVENTION  12/06/2018   CORONARY STENT INTERVENTION N/A  12/06/2018   Procedure: CORONARY STENT INTERVENTION;  Surgeon: Jettie Booze, MD;  Location: [facility];  Service: Cardiovascular;  Laterality: N/A;   ESOPHAGOGASTRODUODENOSCOPY N/A 03/09/2019   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    ESOPHAGOGASTRODUODENOSCOPY N/A 03/21/2019   non-bleeding ulcers resulting from APC ablation of AVMs   GIVENS CAPSULE STUDY N/A 03/22/2019    not complete to the cecum. However, a polypoid-type lesion with vascular component noted in small bowel, with plans for CTE for further evaluation. Query blue rubber bleb nevus   MINOR CARPAL TUNNEL     REPLACEMENT TOTAL KNEE     RIGHT/LEFT HEART CATH AND CORONARY ANGIOGRAPHY N/A 12/06/2018   Procedure: RIGHT/LEFT HEART CATH AND CORONARY ANGIOGRAPHY;  Surgeon: Jettie Booze, MD;  Location: [facility];  Service: Cardiovascular;  Laterality: N/A;       HPI  from the history and physical done on the day of admission:   Chief Complaint: Chest pain, black stools.   HPI: Antonio Watkins is a 85 y.o. male  with medical history significant for coronary artery disease wi`th PCI , hypertension, diabetes mellitus, atrial fibrillation, gout and anemia, patient presented to the ED with complaints of sudden onset of left-sided chest pain that started this morning.  He was sitting down when chest pain started, lasted about 2 to 3 minutes and resolved spontaneously.  He describes pain as sharp and pressure-like.  Chest pain was nonradiating and associated with difficulty breathing.  Patient also had some mid lower chest pain that is worse with movements and coughing. Patient also reports today he had 2 episodes of black stools.  No abdominal pain.  No bloody stools.  No vomiting.  He denies NSAID use. Patient is on Xarelto and compliance.  ED Course: Systolic blood pressure systolic 440H to 474Q.  Heart rate 60s to 90s.  High-sensitivity troponin x2 within normal limits.  Hemoglobin stable at  11.  Normal lipase 32.  Creatinine  1.38.  Sodium low 131.  Potassium low 2.8.  Magnesium 1.7.  EKG shows atrial fibrillation, without significant changes from prior.  CT abdomen and pelvis with contrast shows colonic diverticulosis without diverticulitis, and hepatic cirrhosis.  Portable chest x-ray-no acute abnormality.  Hospitalist will admit for possible GI bleed and chest pain.      Hospital Course:    Brief Summary:- 85 yr old male patient with history of CAD stents to RCA and Cfx 2001 & 2002 [facility], S/P DES ostial-prox Cfx and PTCA OM1 with residual 90 % distal LAD 12/06/18. Plan for Plavix for at least one month but with plans to stop after Aug 19 and start ASA but Plavix not on home med list when he came in but patient thinks he taking it. On Xarelto for Persistent Afib, HTN, HLD, recent endoscopy 03/30/2019 non bleeding superficial gastric ulcer at fundus resulting from [facility] ablation of AVM.   Patient admitted with chest pain-sharp under left breast, epigastric area lasted a couple of hours and 2 episodes of melena. Denies chest pressure, radiation, dyspnea, dizziness or presyncope. Thinks he's taking Plavix but will verify with his wife-96 yr old who manages his meds. Very sedentary but still drives. Potassium 2.8(now 3.8), troponin negative, Hbg 10.8. Ekg Afib with lateral TWI more pronounced than EKG 03-30-2019. Cardiology consult and recommendations appreciated    A/P 1) Chest Pain- H/o multivessel CAD---Prior LHC showed Patient with multivessel disease. However, his LAD disease is at the apex. He would not benefit from bypass surgery. The RCA is a long diffuse lesion with collaterals---Cardiology consult from Dr. Bronson Ing appreciated, given GI bleed he recommends stopping antiplatelet agents including aspirin and Plavix which patient was not taking prior to admission anyway.  As per Dr. Bronson Ing okay to restart Xarelto on 05/02/2019 if no further bleeding.  No additional cardiac testing  planned.  Continue Lipitor  2)Possible GI bleed--patient reported dark stools, this may be due to iron supplementation ,  stool Hemoccult is negative, serial hemoglobin is stable around 11 (11.1, 10.8, 10.9)--- Protonix 40 mg twice daily advised, no ongoing bleeding noted at this time, if no further bleeding concerns may restart Xarelto on 05/02/2019.  Avoid NSAIDs  3)Chronic/persistent atrial fibrillation--- as per cardiology okay to continue to hold off AV nodal blocking agents, heart rate currently around 60 and stable, may restart Xarelto for stroke prophylaxis on 05/02/2019 if no further bleeding  4)DM2--A1c 6.8 about 5 weeks ago reflecting excellent diabetic control, mostly diet-controlled no routine medications  5)HTN-stable, losartan and HCT daily were temporarily put on hold due to contrast  study may resume these at home   Discharge Condition: stable  Follow UP--GI and cardiology as advised, PCP for repeat CBC within a week  Consults obtained -cardiology  Diet and Activity recommendation:  As advised  Discharge Instructions    Discharge Instructions    Call MD for:  difficulty breathing, headache or visual disturbances   Complete by: As directed    Call MD for:  persistant dizziness or light-headedness   Complete by: As directed    Call MD for:  persistant nausea and vomiting   Complete by: As directed    Call MD for:  severe uncontrolled pain   Complete by: As directed    Call MD for:  temperature >100.4   Complete by: As directed    Diet - low sodium heart healthy   Complete by: As directed    Discharge instructions   Complete by: As directed    1) you are taking Xarelto/rivaroxaban which is a blood thinner so Avoid ibuprofen/Advil/Aleve/Motrin/Goody Powders/Naproxen/BC powders/Meloxicam/Diclofenac/Indomethacin and other Nonsteroidal anti-inflammatory medications as these will make you more likely to bleed and can cause stomach ulcers, can also cause Kidney problems.    2) okay to restart Xarelto on Wednesday, 05/02/2019 if no further bleeding  3) no need to take aspirin and no need to take Plavix anymore  4) follow-up to primary care physician within a week for repeat CBC/complete blood count test  5) please call or return if any concerns about bleeding   Increase activity slowly   Complete by: As directed         Discharge Medications     Allergies as of 04/30/2019      Reactions   Morphine    Other reaction(s): Angioedema      Medication List    STOP taking these medications   DOK 100 MG capsule Generic drug: docusate sodium   FeroSul 325 (65 FE) MG tablet Generic drug: ferrous sulfate     TAKE these medications   acetaminophen 325 MG tablet Commonly known as: TYLENOL Take 2 tablets (650 mg total) by mouth every 6 (six) hours as needed for mild pain (or Fever >/= 101).   albuterol 108 (90 Base) MCG/ACT inhaler Commonly known as: VENTOLIN HFA Inhale 1 puff into the lungs every 6 (six) hours as needed for wheezing or shortness of breath.   atorvastatin 40 MG tablet Commonly known as: LIPITOR TAKE 1 AND 1/2 TABLETS EVERY DAY What changed: See the new instructions.   hydrALAZINE 100 MG tablet Commonly known as: APRESOLINE TAKE 1 TABLET THREE TIMES DAILY ( DOSE INCREASE ) What changed: See the new instructions.   hydrochlorothiazide 12.5 MG tablet Commonly known as: HYDRODIURIL Take 12.5 mg by mouth daily with breakfast.   isosorbide mononitrate 60 MG 24 hr tablet Commonly known as: IMDUR Take 60 mg by mouth daily.   losartan 100 MG tablet Commonly known as: COZAAR Take 100 mg by mouth daily.   magnesium hydroxide 400 MG/5ML suspension Commonly known as: MILK OF MAGNESIA Take 15 mLs by mouth daily as needed for mild constipation.   multivitamin with minerals Tabs tablet Take 1 tablet by mouth daily. Centrum Silver   neomycin-polymyxin-hydrocortisone 3.5-10000-1 OTIC suspension Commonly known as:  CORTISPORIN Place into the right ear as needed. Supposed to be TID but pt only takes prn   nitroGLYCERIN 0.4 MG SL tablet Commonly known as: NITROSTAT DISSOLVE 1 TABLET UNDER THE TONGUE EVERY 5 MINUTES AS NEEDED FOR CHEST PAIN   Omega-3 Fish Oil  1200 MG Caps Take 1,200 mg by mouth 2 (two) times a day.   pantoprazole 40 MG tablet Commonly known as: PROTONIX Take 1 tablet (40 mg total) by mouth 2 (two) times daily before a meal. What changed: when to take this   rivaroxaban 20 MG Tabs tablet Commonly known as: XARELTO Take 1 tablet (20 mg total) by mouth daily with supper. Start taking on: May 02, 2019 What changed: These instructions start on May 02, 2019. If you are unsure what to do until then, ask your doctor or other care provider.   senna-docusate 8.6-50 MG tablet Commonly known as: Senokot-S Take 2 tablets by mouth at bedtime.   SYSTANE ULTRA OP Place 1 drop into both eyes daily as needed (dry eyes).   vitamin B-12 1000 MCG tablet Commonly known as: CYANOCOBALAMIN Take 1,000 mcg by mouth every Sunday.       Major procedures and Radiology Reports - PLEASE review detailed and final reports for all details, in brief -    Dg Abd 1 View  Result Date: 04/19/2019 CLINICAL DATA:  Evaluate for capsule EXAM: ABDOMEN - 1 VIEW COMPARISON:  None. FINDINGS: Surgical clips in the left lower quadrant and pelvis. Prior cholecystectomy clips in the right upper quadrant. Large stool burden throughout the colon. No bowel obstruction, free air or organomegaly. No endoscopy capsule or other foreign body. IMPRESSION: No capsule visualized. Large stool burden throughout the colon. No acute findings. Electronically Signed   By: Rolm Baptise M.D.   On: 04/19/2019 20:08   Ct Abdomen Pelvis W Contrast  Result Date: 04/29/2019 CLINICAL DATA:  Acute generalized abdominal pain beginning today. Melena. EXAM: CT ABDOMEN AND PELVIS WITH CONTRAST TECHNIQUE: Multidetector CT imaging of the abdomen and  pelvis was performed using the standard protocol following bolus administration of intravenous contrast. CONTRAST:  24mL OMNIPAQUE IOHEXOL 300 MG/ML  SOLN COMPARISON:  None. FINDINGS: Lower Chest: No acute findings. Bibasilar scarring. Hepatobiliary: Mild caudate hypertrophy and diffuse capsular nodularity, consistent with hepatic cirrhosis. No hepatic masses identified. No evidence of ascites. Prior cholecystectomy. No evidence of biliary obstruction. Pancreas:  No mass or inflammatory changes. Spleen: Within normal limits in size and appearance. Adrenals/Urinary Tract: No masses identified. Tiny cyst noted in lower pole of left kidney. No evidence of hydronephrosis. Stomach/Bowel: No evidence of obstruction, inflammatory process or abnormal fluid collections. Normal appendix visualized. A few diverticula are seen in the proximal sigmoid colon in the area of surgical staples, however there is no evidence of diverticulitis. Vascular/Lymphatic: No pathologically enlarged lymph nodes. No abdominal aortic aneurysm. Aortic atherosclerosis. Reproductive:  No mass or other significant abnormality. Other:  None. Musculoskeletal:  No suspicious bone lesions identified. IMPRESSION: 1. Colonic diverticulosis, without radiographic evidence of diverticulitis or other acute findings. 2. Hepatic cirrhosis. No evidence of hepatic neoplasm or ascites. Aortic Atherosclerosis (ICD10-I70.0). Electronically Signed   By: Marlaine Hind M.D.   On: 04/29/2019 17:32   Dg Chest Portable 1 View  Result Date: 04/29/2019 CLINICAL DATA:  Episode of chest pain earlier today. Mild shortness of breath. EXAM: PORTABLE CHEST 1 VIEW COMPARISON:  None. FINDINGS: Lungs are clear. Cardiomegaly. No pneumothorax or pleural fluid. No acute or focal bony abnormality IMPRESSION: No acute disease. Cardiomegaly. Electronically Signed   By: Inge Rise M.D.   On: 04/29/2019 17:32    Micro Results    Recent Results (from the past 240 hour(s))  SARS  Coronavirus 2 (CEPHEID - Performed in [place] lab), [facility]  Status: None   Collection Time: 04/29/19  5:51 PM   Specimen: Nasopharyngeal Swab  Result Value Ref Range Status   SARS Coronavirus 2 NEGATIVE NEGATIVE Final    Comment: (NOTE) If result is NEGATIVE SARS-CoV-2 target nucleic acids are NOT DETECTED. The SARS-CoV-2 RNA is generally detectable in upper and lower  respiratory specimens during the acute phase of infection. The lowest  concentration of SARS-CoV-2 viral copies this assay can detect is 250  copies / mL. A negative result does not preclude SARS-CoV-2 infection  and should not be used as the sole basis for treatment or other  patient management decisions.  A negative result may occur with  improper specimen collection / handling, submission of specimen other  than nasopharyngeal swab, presence of viral mutation(s) within the  areas targeted by this assay, and inadequate number of viral copies  (<250 copies / mL). A negative result must be combined with clinical  observations, patient history, and epidemiological information. If result is POSITIVE SARS-CoV-2 target nucleic acids are DETECTED. The SARS-CoV-2 RNA is generally detectable in upper and lower  respiratory specimens dur ing the acute phase of infection.  Positive  results are indicative of active infection with SARS-CoV-2.  Clinical  correlation with patient history and other diagnostic information is  necessary to determine patient infection status.  Positive results do  not rule out bacterial infection or co-infection with other viruses. If result is PRESUMPTIVE POSTIVE SARS-CoV-2 nucleic acids MAY BE PRESENT.   A presumptive positive result was obtained on the submitted specimen  and confirmed on repeat testing.  While 2019 novel coronavirus  (SARS-CoV-2) nucleic acids may be present in the submitted sample  additional confirmatory testing may be necessary for epidemiological  and / or  clinical management purposes  to differentiate between  SARS-CoV-2 and other Sarbecovirus currently known to infect humans.  If clinically indicated additional testing with an alternate test  methodology [phone]) is advised. The SARS-CoV-2 RNA is generally  detectable in upper and lower respiratory sp ecimens during the acute  phase of infection. The expected result is Negative. Fact Sheet for Patients:  StrictlyIdeas.no Fact Sheet for Healthcare Providers: BankingDealers.co.za This test is not yet approved or cleared by the [place] FDA and has been authorized for detection and/or diagnosis of SARS-CoV-2 by FDA under an Emergency Use Authorization (EUA).  This EUA will remain in effect (meaning this test can be used) for the duration of the COVID-19 declaration under Section 564(b)(1) of the Act, 21 U.S.C. section 360bbb-3(b)(1), unless the authorization is terminated or revoked sooner. Performed at [facility], [address], [place], [place] [address]    Today   Subjective    Antonio Watkins today has no new complaints, no further concerns about GI bleed, no abdominal pain, no vomiting, no chest pains, no palpitations, no dizziness, eager to go home        Patient has been seen and examined prior to discharge   Objective   Blood pressure 132/73, pulse (!) 56, temperature 98.3 F (36.8 C), temperature source Oral, resp. rate 16, height 5\' 10"  (1.778 m), weight 95.3 kg, SpO2 97 %.   Intake/Output Summary (Last 24 hours) at 04/30/2019 1629 Last data filed at 04/30/2019 1309 Gross per 24 hour  Intake 552.37 ml  Output 1250 ml  Net -697.63 ml    Exam Gen:- Awake Alert, no acute distress  HEENT:- [place].AT, No sclera icterus Neck-Supple Neck,No JVD,.  Lungs-  CTAB , good air movement bilaterally  CV- S1, S2 normal, irregular Abd-  +ve B.Sounds, Abd Soft, No tenderness,    Extremity/Skin:- No  edema,   good  pulses Psych-affect is appropriate, oriented x3 Neuro-no new focal deficits, no tremors    Data Review   CBC w Diff:  Lab Results  Component Value Date   WBC 4.9 04/30/2019   HGB 10.9 (L) 04/30/2019   HCT 33.5 (L) 04/30/2019   PLT 222 04/30/2019   LYMPHOPCT 16 04/29/2019   MONOPCT 14 04/29/2019   EOSPCT 1 04/29/2019   BASOPCT 1 04/29/2019    CMP:  Lab Results  Component Value Date   NA 135 04/30/2019   K 3.8 04/30/2019   CL 101 04/30/2019   CO2 23 04/30/2019   BUN 13 04/30/2019   CREATININE 1.17 04/30/2019   PROT 7.4 04/29/2019   ALBUMIN 4.0 04/29/2019   BILITOT 0.6 04/29/2019   ALKPHOS 70 04/29/2019   AST 26 04/29/2019   ALT 24 04/29/2019   Total Discharge time is about 33 minutes  Roxan Hockey M.D on 04/30/2019 at 4:29 PM  Go to [email or web address] -  for contact info  [facility] - Office  [phone]

## 2019-04-30 NOTE — Progress Notes (Signed)
Central tele report afib rate > 140 bpm. Upon assessment, patient was up at sink washing off and brushing teeth. Denies any c/o pain, dizziness or SOB. Pt asked to return to bed for review of vital signs. No issues noted.

## 2019-04-30 NOTE — Consult Note (Addendum)
Cardiology Consultation:   Patient ID: Antonio Watkins MRN: [id number]; DOB: 08/05/1930  Admit date: 04/29/2019 Date of Consult: 04/30/2019  Primary Care Provider: Jacqualine Code, DO Primary Cardiologist: Kate Sable, MD  Primary Electrophysiologist:  None    Patient Profile:   Antonio Watkins is a 85 y.o. male with a hx of CAD, Afib  who is being seen today for the evaluation of chest pain at the request of Dr. Denton Brick.  History of Present Illness:   Antonio Watkins is an 85 yr old male patient with history of CAD stents to RCA and Cfx 2001 & 2002 [facility], S/P DES ostial-prox Cfx and PTCA OM1 with residual 90 % distal LAD 12/06/18. Plan for Plavix for at least one month but with plans to stop after Aug 19 and start ASA but Plavix not on home med list when he came in but patient thinks he taking it. On Xarelto for Persistent Afib, HTN, HLD, recent endo 04-20-2019 non bleeding superficial gastric ulcer at fundus resulting from [facility] ablation of AVM.   Patient admitted with chest pain-sharp under left breast, epigastric area lasted a couple of hours and 2 episodes of melena. Denies chest pressure, radiation, dyspnea, dizziness or presyncope. Thinks he's taking Plavix but will verify with his wife-39 yr old who manages his meds. Very sedentary but still drives. Potassium 2.8(now 3.8), troponin negative, Hbg 10.8. Ekg Afib with lateral TWI more pronounced than EKG 20-Apr-2019.  Heart Pathway Score:     Past Medical History:  Diagnosis Date  . Anginal pain ([place])   . Atrial fibrillation ([place])   . CAD (coronary artery disease)   . Cancer of sigmoid colon ([place])    approx 1990  . DM type 2 (diabetes mellitus, type 2) ([place])   . GERD (gastroesophageal reflux disease)   . Gout   . Hiatal hernia   . Hyperlipemia   . Hypertension   . Incontinence     Past Surgical History:  Procedure Laterality Date  . BIOPSY  03/09/2019   Procedure: BIOPSY;  Surgeon: Danie Binder, MD;  [facility];  Service: Endoscopy;;  . cataracts    . CHOLECYSTECTOMY    . COLONOSCOPY     STOPPED BREATHING  . CORONARY STENT INTERVENTION  12/06/2018  . CORONARY STENT INTERVENTION N/A 12/06/2018   Procedure: CORONARY STENT INTERVENTION;  Surgeon: Jettie Booze, MD;  Location: [place] [facility];  Service: Cardiovascular;  Laterality: N/A;  . ESOPHAGOGASTRODUODENOSCOPY N/A 03/09/2019   Procedure: ESOPHAGOGASTRODUODENOSCOPY (EGD);  Surgeon: Danie Binder, MD;  [facility];  Service: Endoscopy;  Laterality: N/A;  10:30am - pt knows to arrive at 10:15  . ESOPHAGOGASTRODUODENOSCOPY N/A 04/20/2019   Procedure: ESOPHAGOGASTRODUODENOSCOPY (EGD);  Surgeon: Rogene Houston, MD;  [facility];  Service: Endoscopy;  Laterality: N/A;  . GIVENS CAPSULE STUDY N/A 03/22/2019   Procedure: GIVENS CAPSULE STUDY;  Surgeon: Danie Binder, MD;  [facility];  Service: Endoscopy;  Laterality: N/A;  . MINOR CARPAL TUNNEL    . REPLACEMENT TOTAL KNEE    . RIGHT/LEFT HEART CATH AND CORONARY ANGIOGRAPHY N/A 12/06/2018   Procedure: RIGHT/LEFT HEART CATH AND CORONARY ANGIOGRAPHY;  Surgeon: Jettie Booze, MD;  Location: [place] [facility];  Service: Cardiovascular;  Laterality: N/A;     Home Medications:  Prior to Admission medications   Medication Sig Start Date End Date Taking? Authorizing Provider  albuterol (PROVENTIL HFA;VENTOLIN HFA) 108 (90 Base) MCG/ACT inhaler Inhale 1 puff  into the lungs every 6 (six) hours as needed for wheezing or shortness of breath. 01/23/19  Yes Branch, Alphonse Guild, MD  atorvastatin (LIPITOR) 40 MG tablet TAKE 1 AND 1/2 TABLETS EVERY DAY Patient taking differently: Take 60 mg by mouth daily.  12/05/18  Yes Herminio Commons, MD  docusate sodium (DOK) 100 MG capsule Take 100 mg by mouth 2 (two) times daily.    Yes [provider]  FEROSUL 325 (65 Fe) MG tablet Take 325 mg by mouth 2 (two) times daily with a meal. for 7 days 04/03/19  Yes  [provider]  hydrALAZINE (APRESOLINE) 100 MG tablet TAKE 1 TABLET THREE TIMES DAILY ( DOSE INCREASE ) Patient taking differently: Take 100 mg by mouth 2 (two) times daily. Pt supposed to take TID but only takes BID 07/31/18  Yes Herminio Commons, MD  hydrochlorothiazide (HYDRODIURIL) 12.5 MG tablet Take 12.5 mg by mouth daily with breakfast.    Yes [provider]  isosorbide mononitrate (IMDUR) 60 MG 24 hr tablet Take 60 mg by mouth daily.   Yes [provider]  losartan (COZAAR) 100 MG tablet Take 100 mg by mouth daily.   Yes [provider]  magnesium hydroxide (MILK OF MAGNESIA) 400 MG/5ML suspension Take 15 mLs by mouth daily as needed for mild constipation.   Yes [provider]  Multiple Vitamin (MULTIVITAMIN WITH MINERALS) TABS tablet Take 1 tablet by mouth daily. Centrum Silver   Yes [provider]  neomycin-polymyxin-hydrocortisone (CORTISPORIN) 3.5-10000-1 OTIC suspension Place into the right ear as needed. Supposed to be TID but pt only takes prn   Yes [provider]  Omega-3 Fatty Acids (OMEGA-3 FISH OIL) 1200 MG CAPS Take 1,200 mg by mouth 2 (two) times a day.    Yes [provider]  pantoprazole (PROTONIX) 40 MG tablet Take 1 tablet (40 mg total) by mouth 2 (two) times daily. 03/22/19 06/20/19 Yes Sheikh, Omair Latif, DO  Polyethyl Glycol-Propyl Glycol (SYSTANE ULTRA OP) Place 1 drop into both eyes daily as needed (dry eyes).   Yes [provider]  rivaroxaban (XARELTO) 20 MG TABS tablet Take 1 tablet (20 mg total) by mouth daily with supper. 12/25/18  Yes Kilroy, Luke K, PA-C  vitamin B-12 (CYANOCOBALAMIN) 1000 MCG tablet Take 1,000 mcg by mouth every Sunday.   Yes [provider]  nitroGLYCERIN (NITROSTAT) 0.4 MG SL tablet DISSOLVE 1 TABLET UNDER THE TONGUE EVERY 5 MINUTES AS NEEDED FOR CHEST PAIN 12/07/18   Darreld Mclean, PA-C    Inpatient Medications: Scheduled Meds: . atorvastatin   60 mg Oral Daily  . feeding supplement (ENSURE ENLIVE)  237 mL Oral BID BM  . hydrALAZINE  100 mg Oral BID  . insulin aspart  0-9 Units Subcutaneous Q4H  . isosorbide mononitrate  60 mg Oral Daily  . pantoprazole (PROTONIX) IV  40 mg Intravenous Q12H   Continuous Infusions: . sodium chloride 1,000 mL (04/29/19 2249)   PRN Meds: sodium chloride, acetaminophen **OR** acetaminophen, albuterol, fentaNYL (SUBLIMAZE) injection, hydrALAZINE, nitroGLYCERIN, ondansetron **OR** ondansetron (ZOFRAN) IV, polyethylene glycol  Allergies:    Allergies  Allergen Reactions  . Morphine     Other reaction(s): Angioedema    Social History:   Social History   Socioeconomic History  . Marital status: Married    Spouse name: Not on file  . Number of children: 5  . Years of education: Not on file  . Highest education level: Not on file  Occupational History  .  Occupation: retired  Scientific laboratory technician  . Financial resource strain: Not hard at all  . Food insecurity    Worry: Patient refused    Inability: Patient refused  . Transportation needs    Medical: Patient refused    Non-medical: Patient refused  Tobacco Use  . Smoking status: Former Smoker    Packs/day: 1.00    Years: 20.00    Pack years: 20.00    Types: Cigars    Start date: 07/18/1993    Quit date: 10/02/2017    Years since quitting: 1.5  . Smokeless tobacco: Never Used  Substance and Sexual Activity  . Alcohol use: Yes    Alcohol/week: 0.0 standard drinks    Comment: Socially  . Drug use: Never  . Sexual activity: Not on file  Lifestyle  . Physical activity    Days per week: 0 days    Minutes per session: 0 min  . Stress: Not at all  Relationships  . Social connections    Talks on phone: More than three times a week    Gets together: Twice a week    Attends religious service: More than 4 times per year    Active member of club or organization: No    Attends meetings of clubs or organizations: Never    Relationship status:  Married  . Intimate partner violence    Fear of current or ex partner: Patient refused    Emotionally abused: Patient refused    Physically abused: Patient refused    Forced sexual activity: Patient refused  Other Topics Concern  . 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 [place] SEP 2020: 4 BIO, 1 STEP.    Family History:     Family History  Problem Relation Age of Onset  . Heart disease Mother   . Heart disease Brother   . Diabetes Sister   . Colon cancer Brother   . Stomach cancer Sister   . Lupus Sister   . Heart disease Sister      ROS:  Please see the history of present illness.   Review of Systems  Constitution: Negative.  HENT: Negative.   Cardiovascular: Positive for chest pain.  Respiratory: Negative.   Endocrine: Negative.   Hematologic/Lymphatic: Negative.   Musculoskeletal: Negative.   Gastrointestinal: Positive for melena.  Genitourinary: Negative.   Neurological: Negative.     All other ROS reviewed and negative.     Physical Exam/Data:   Vitals:   04/29/19 2046 04/30/19 0525 04/30/19 0743 04/30/19 0756  BP: (!) 184/95 (!) 153/78  131/85  Pulse: 62 89  89  Resp:  20  20  Temp:  98.5 F (36.9 C)    TempSrc:  Oral    SpO2: 100% 100% 98% 100%  Weight:      Height:        Intake/Output Summary (Last 24 hours) at 04/30/2019 0900 Last data filed at 04/30/2019 6269 Gross per 24 hour  Intake 72.37 ml  Output 650 ml  Net -577.63 ml   Last 3 Weights 04/29/2019 04/26/2019 04/25/2019  Weight (lbs) 210 lb 199 lb 15.3 oz 200 lb  Weight (kg) 95.255 kg 90.7 kg 90.719 kg     Body mass index is 30.13 kg/m.  General:  Well nourished, well developed, in no acute distress HEENT: normal Lymph: no adenopathy Neck: no JVD Endocrine:  No thryomegaly Vascular: No carotid bruits; FA pulses 2+  bilaterally without bruits  Cardiac:  normal S1, S2; irreg ;2/6 systolic murmur LSB Lungs:  clear to  auscultation bilaterally, no wheezing, rhonchi or rales  Abd: soft, nontender, no hepatomegaly  Ext: no edema Musculoskeletal:  No deformities, BUE and BLE strength normal and equal Skin: warm and dry  Neuro:  CNs 2-12 intact, no focal abnormalities noted Psych:  Normal affect   EKG:  The EKG was personally reviewed and demonstrates:  Afib at 61/m with lateral TWI more pronounced than previous EKG Telemetry:  Telemetry was personally reviewed and demonstrates:  Afib with some fast rates up to 143 this am, also 6-7 beat WCT  Relevant CV Studies:   Coronary angiography (12/21/18):    Mid RCA lesion is 90% stenosed.  Dist RCA lesion is 95% stenosed. Post Atrio lesion is 95% stenosed. THere are left to right collaterals filling the distal RCA system.  Ost Cx to Prox Cx lesion is 80% stenosed.  A drug-eluting stent was successfully placed using a STENT SYNERGY DES 2.75X12, dilated to 3.25 mm.  Post intervention, there is a 0% residual stenosis.  Ost 1st Mrg lesion is 80% stenosed.  Scoring balloon angioplasty was performed using a BALLOON WOLVERINE 2.50X10.  Post intervention, there is a 30% residual stenosis.  Mid LAD lesion is 50% stenosed.  Dist LAD lesion is 90% stenosed.  The left ventricular systolic function is normal.  The left ventricular ejection fraction is 50-55% by visual estimate.  LV end diastolic pressure is normal.  There is no aortic valve stenosis.  Hemodynamic findings consistent with mild pulmonary hypertension.  Ao 90%, PA saturation 63%, mean PA 33 mmHg, mean wedge pressure 16 mmHg, Cardiac output 6.9 L/min, cardiac index 3.2   Patient with multivessel disease.  However, his LAD disease is at the apex.  He would not benefit from bypass surgery.  The RCA is a long diffuse lesion with collaterals.  I elected to intervene upon the circumflex since that seemed to supply the most myocardium at risk.   Synergy stent was placed.  If he has any bleeding  issues, first would stop aspirin before the 1 month recommendation.  After that, could consider stopping Plavix before 6 months, but he must complete at least 30 days.   Restart Xarelto tomorrow.   Continue aggressive secondary prevention.    Laboratory Data:  High Sensitivity Troponin:   Recent Labs  Lab 04/29/19 1544 04/29/19 1827  TROPONINIHS 9.00 9.00     Cardiac EnzymesNo results for input(s): TROPONINI in the last 168 hours. No results for input(s): TROPIPOC in the last 168 hours.  Chemistry Recent Labs  Lab 04/29/19 1544 04/30/19 0536  NA 131* 135  K 2.8* 3.8  CL 98 101  CO2 22 23  GLUCOSE 184* 108*  BUN 16 13  CREATININE 1.38* 1.17  CALCIUM 9.4 9.3  GFRNONAA 46* 56*  GFRAA 53* >60  ANIONGAP 11 11    Recent Labs  Lab 04/29/19 1544  PROT 7.4  ALBUMIN 4.0  AST 26  ALT 24  ALKPHOS 70  BILITOT 0.6   Hematology Recent Labs  Lab 04/29/19 1544 04/29/19 2224 04/30/19 0536  WBC 5.0  --  4.9  RBC 4.03*  --  3.91*  HGB 11.1* 10.8* 10.9*  HCT 34.1* 33.3* 33.5*  MCV 84.6  --  85.7  MCH 27.5  --  27.9  MCHC 32.6  --  32.5  RDW 25.0*  --  25.8*  PLT 248  --  222  BNPNo results for input(s): BNP, PROBNP in the last 168 hours.  DDimer No results for input(s): DDIMER in the last 168 hours.   Radiology/Studies:  Ct Abdomen Pelvis W Contrast  Result Date: 04/29/2019 CLINICAL DATA:  Acute generalized abdominal pain beginning today. Melena. EXAM: CT ABDOMEN AND PELVIS WITH CONTRAST TECHNIQUE: Multidetector CT imaging of the abdomen and pelvis was performed using the standard protocol following bolus administration of intravenous contrast. CONTRAST:  22mL OMNIPAQUE IOHEXOL 300 MG/ML  SOLN COMPARISON:  None. FINDINGS: Lower Chest: No acute findings. Bibasilar scarring. Hepatobiliary: Mild caudate hypertrophy and diffuse capsular nodularity, consistent with hepatic cirrhosis. No hepatic masses identified. No evidence of ascites. Prior cholecystectomy. No evidence of  biliary obstruction. Pancreas:  No mass or inflammatory changes. Spleen: Within normal limits in size and appearance. Adrenals/Urinary Tract: No masses identified. Tiny cyst noted in lower pole of left kidney. No evidence of hydronephrosis. Stomach/Bowel: No evidence of obstruction, inflammatory process or abnormal fluid collections. Normal appendix visualized. A few diverticula are seen in the proximal sigmoid colon in the area of surgical staples, however there is no evidence of diverticulitis. Vascular/Lymphatic: No pathologically enlarged lymph nodes. No abdominal aortic aneurysm. Aortic atherosclerosis. Reproductive:  No mass or other significant abnormality. Other:  None. Musculoskeletal:  No suspicious bone lesions identified. IMPRESSION: 1. Colonic diverticulosis, without radiographic evidence of diverticulitis or other acute findings. 2. Hepatic cirrhosis. No evidence of hepatic neoplasm or ascites. Aortic Atherosclerosis (ICD10-I70.0). Electronically Signed   By: Marlaine Hind M.D.   On: 04/29/2019 17:32   Dg Chest Portable 1 View  Result Date: 04/29/2019 CLINICAL DATA:  Episode of chest pain earlier today. Mild shortness of breath. EXAM: PORTABLE CHEST 1 VIEW COMPARISON:  None. FINDINGS: Lungs are clear. Cardiomegaly. No pneumothorax or pleural fluid. No acute or focal bony abnormality IMPRESSION: No acute disease. Cardiomegaly. Electronically Signed   By: Inge Rise M.D.   On: 04/29/2019 17:32    Assessment and Plan:   1. Chest pain troponin negative, EKG with more pronounce lateral TWI 2. CAD S/P remote stenting RCA and Cfx 2001 & 2002 [facility], S/P DES OM1 and PTCA OM1 11/2018-plan for Plavix for 6 months(Aug 2020) but  not on med list but patient thinks he's taking-verify with his wife. Would continue medical therapy for CAD. 3. Persistent Afib on Xarelto-on hold secondary to melena-some fast HR at 143 this am. Not on rate lowering meds because of bradycardia and 3-4 sec pauses on  holter. Also NSVT(could be aberrancy but in setting of hypokalemia) 4. Melana x 2-Hbg stable, recent gastric ulcer 03/2019 followed by GI 5. Essential HTN 6. Hyperlipidemia-continue atorvastatin      For questions or updates, please contact Veteran Please consult [email or web address] for contact info under     Signed, Ermalinda Barrios, PA-C  04/30/2019 9:00 AM   The patient was seen and examined, and I agree with the history, physical exam, assessment and plan as documented above, with modifications as noted below. I have also personally reviewed all relevant documentation, old records, labs, and both radiographic and cardiovascular studies. I have also independently interpreted old and new ECG's.  I recently did a telehealth visit with him on 04/25/2019 at which time he was doing well.  He presented to the hospital yesterday with sharp left-sided chest pain and epigastric pain associated with 2 episodes of melena.  Hemoglobin appears to be stable.  He is uncertain if he is taking Plavix.  He was taking Xarelto which has since been held.  He was given sublingual nitroglycerin and IV fentanyl chest pain is since resolved.  He feels well today and wants to go home.  Potassium initially 2.8 now up to 3.8.  Troponins are normal.  Hemoglobin is 10.8.  ECG shows atrial fibrillation initially with inferolateral T wave inversions and today's ECG demonstrates more nonspecific T wave abnormalities in lateral leads.  Telemetry demonstrates episodes of rapid atrial fibrillation as high as 149 bpm.  He also had a 7 beat run of nonsustained ventricular tachycardia in the setting of hypokalemia.  He was asymptomatic with this.  He has been off of beta-blockers due to prior pauses and bradycardia.  I spoke with GI.  For the time being all antiplatelet therapy and Xarelto are being held.  If no procedures are being planned, Xarelto will be resumed tomorrow.  They prefer Plavix be used over aspirin if need be.    The patient spoke with his wife and it appears he is not taking Plavix at the present time.  Given his issues with GI bleeding and anemia, I do not plan to resume either aspirin or Plavix.   Once he is deemed stable from a GI standpoint, Xarelto can be continued.  Heart rate currently in the 60 bpm range.  I will hold off on AV nodal blocking agents.  No cardiac testing is planned.   Kate Sable, MD, [facility]  04/30/2019 9:58 AM

## 2019-04-30 NOTE — Progress Notes (Signed)
*  PRELIMINARY RESULTS* Echocardiogram 2D Echocardiogram has been performed.  Leavy Cella 04/30/2019, 3:23 PM

## 2019-04-30 NOTE — Plan of Care (Signed)

## 2019-04-30 NOTE — Consult Note (Addendum)
Referring Provider: Dr. Denton Brick Primary Care Physician:  Jacqualine Code, DO Primary Gastroenterologist:  Dr. Oneida Alar   Date of Admission: 04/29/19 Date of Consultation: 04/30/19  Reason for Consultation:  Melena   HPI:  Antonio Watkins is an 85 y.o. year old male with history of CAD s/p DES and angioplasty in Feb 2020, afib, historically on Xarelto and Plavix with known history of 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 Repeat EGD in June due to acute on chronic anemia and melena with non-bleeding ulcers resulting from [facility] ablation of AVMs. Capsule study was completed 03/22/2019 but not complete to the cecum. However, a polypoid-type lesion with vascular component noted in small bowel, with plans for CTE for further evaluation. Query blue rubber bleb nevus. Outside labs from 6/15 after hospitalization with Hgb 7.8, slightly down from 8.1 at discharge. Ferritin 11. He was instructed to only take Xarelto and not plavix or aspirin. Feraheme received 7/2 and 7/9, and he was referred to Hematology. Presented yesterday with chest pain and reports of black stools. Admitting Hgb 11.1. Heme negative. Hgb overall stable this morning.    States one episode of black,shiny stool yesterday. Denies NSAIDs, aspirin powders. Denies taking oral iron, pepto imodium. Believes he is on Xarelto. However, he is unsure if he is taking Plavix. Not taking aspirin, stating this was stopped months ago. He is not sure if he is taking a PPI, but his med list includes this at home. No further black stool since admission yesterday. Denies abdominal pain, N/V. Left-sided chest discomfort just under breast started yesterday, described as "not a hard pain" and was not associated with shortness of breath. Resolved after presentation to ED. Denies chronic GERD. No dysphagia. No changes in bowel habits.   CT abd/pelvis with contrast this admission with new findings of hepatic cirrhosis. No acute findings. LFTs normal.  Reports history of heavy drinking when he was younger but no alcohol in years. CTE has been scheduled for   Remote history of colon cancer in the 1990s, with reportedly last colonoscopy in [place] possibly one year ago. Records not available currently.    Past Medical History:  Diagnosis Date  . Anginal pain ([place])   . Atrial fibrillation ([place])   . CAD (coronary artery disease)   . Cancer of sigmoid colon ([place])    approx 1990  . DM type 2 (diabetes mellitus, type 2) ([place])   . GERD (gastroesophageal reflux disease)   . Gout   . Hiatal hernia   . Hyperlipemia   . Hypertension   . Incontinence     Past Surgical History:  Procedure Laterality Date  . BIOPSY  03/09/2019   Procedure: BIOPSY;  Surgeon: Danie Binder, MD;  Location: [facility];  Service: Endoscopy;;  . cataracts    . CHOLECYSTECTOMY    . COLONOSCOPY     STOPPED BREATHING  . CORONARY STENT INTERVENTION  12/06/2018  . CORONARY STENT INTERVENTION N/A 12/06/2018   Procedure: CORONARY STENT INTERVENTION;  Surgeon: Jettie Booze, MD;  Location: [place] [facility];  Service: Cardiovascular;  Laterality: N/A;  . 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   . ESOPHAGOGASTRODUODENOSCOPY N/A 03/21/2019   non-bleeding ulcers resulting from APC ablation of AVMs  . GIVENS CAPSULE STUDY N/A 03/22/2019    not complete to the cecum. However, a polypoid-type lesion with vascular component noted in small bowel, with plans for CTE for further  evaluation. Query blue rubber bleb nevus  . MINOR CARPAL TUNNEL    . REPLACEMENT TOTAL KNEE    . RIGHT/LEFT HEART CATH AND CORONARY ANGIOGRAPHY N/A 12/06/2018   Procedure: RIGHT/LEFT HEART CATH AND CORONARY ANGIOGRAPHY;  Surgeon: Jettie Booze, MD;  Location: [place] [facility];  Service: Cardiovascular;  Laterality: N/A;    Prior to Admission medications   Medication Sig Start Date End Date Taking? Authorizing Provider  albuterol  (PROVENTIL HFA;VENTOLIN HFA) 108 (90 Base) MCG/ACT inhaler Inhale 1 puff into the lungs every 6 (six) hours as needed for wheezing or shortness of breath. 01/23/19  Yes Branch, Alphonse Guild, MD  atorvastatin (LIPITOR) 40 MG tablet TAKE 1 AND 1/2 TABLETS EVERY DAY Patient taking differently: Take 60 mg by mouth daily.  12/05/18  Yes Herminio Commons, MD  docusate sodium (DOK) 100 MG capsule Take 100 mg by mouth 2 (two) times daily.    Yes [provider]  FEROSUL 325 (65 Fe) MG tablet Take 325 mg by mouth 2 (two) times daily with a meal. for 7 days 04/03/19  Yes [provider]  hydrALAZINE (APRESOLINE) 100 MG tablet TAKE 1 TABLET THREE TIMES DAILY ( DOSE INCREASE ) Patient taking differently: Take 100 mg by mouth 2 (two) times daily. Pt supposed to take TID but only takes BID 07/31/18  Yes Herminio Commons, MD  hydrochlorothiazide (HYDRODIURIL) 12.5 MG tablet Take 12.5 mg by mouth daily with breakfast.    Yes [provider]  isosorbide mononitrate (IMDUR) 60 MG 24 hr tablet Take 60 mg by mouth daily.   Yes [provider]  losartan (COZAAR) 100 MG tablet Take 100 mg by mouth daily.   Yes [provider]  magnesium hydroxide (MILK OF MAGNESIA) 400 MG/5ML suspension Take 15 mLs by mouth daily as needed for mild constipation.   Yes [provider]  Multiple Vitamin (MULTIVITAMIN WITH MINERALS) TABS tablet Take 1 tablet by mouth daily. Centrum Silver   Yes [provider]  neomycin-polymyxin-hydrocortisone (CORTISPORIN) 3.5-10000-1 OTIC suspension Place into the right ear as needed. Supposed to be TID but pt only takes prn   Yes [provider]  Omega-3 Fatty Acids (OMEGA-3 FISH OIL) 1200 MG CAPS Take 1,200 mg by mouth 2 (two) times a day.    Yes [provider]  pantoprazole (PROTONIX) 40 MG tablet Take 1 tablet (40 mg total) by mouth 2 (two) times daily. 03/22/19 06/20/19 Yes Sheikh, Omair Latif, DO  Polyethyl Glycol-Propyl  Glycol (SYSTANE ULTRA OP) Place 1 drop into both eyes daily as needed (dry eyes).   Yes [provider]  rivaroxaban (XARELTO) 20 MG TABS tablet Take 1 tablet (20 mg total) by mouth daily with supper. 12/25/18  Yes Kilroy, Luke K, PA-C  vitamin B-12 (CYANOCOBALAMIN) 1000 MCG tablet Take 1,000 mcg by mouth every Sunday.   Yes [provider]  nitroGLYCERIN (NITROSTAT) 0.4 MG SL tablet DISSOLVE 1 TABLET UNDER THE TONGUE EVERY 5 MINUTES AS NEEDED FOR CHEST PAIN 12/07/18   Darreld Mclean, PA-C    Current Facility-Administered Medications  Medication Dose Route Frequency Provider Last Rate Last Dose  . 0.9 %  sodium chloride infusion   Intravenous PRN Emokpae, Ejiroghene E, MD 10 mL/hr at 04/29/19 2249 1,000 mL at 04/29/19 2249  . acetaminophen (TYLENOL) tablet 650 mg  650 mg Oral Q6H PRN Emokpae, Ejiroghene E, MD       Or  . acetaminophen (TYLENOL) suppository 650 mg  650 mg Rectal Q6H  PRN Emokpae, Ejiroghene E, MD      . albuterol (PROVENTIL) (2.5 MG/3ML) 0.083% nebulizer solution 2.5 mg  2.5 mg Inhalation Q6H PRN Emokpae, Ejiroghene E, MD      . atorvastatin (LIPITOR) tablet 60 mg  60 mg Oral Daily Emokpae, Ejiroghene E, MD      . feeding supplement (ENSURE ENLIVE) (ENSURE ENLIVE) liquid 237 mL  237 mL Oral BID BM Emokpae, Ejiroghene E, MD      . fentaNYL (SUBLIMAZE) injection 25 mcg  25 mcg Intravenous Q4H PRN Emokpae, Ejiroghene E, MD   25 mcg at 04/29/19 2051  . hydrALAZINE (APRESOLINE) injection 10 mg  10 mg Intravenous Q4H PRN Emokpae, Ejiroghene E, MD      . hydrALAZINE (APRESOLINE) tablet 100 mg  100 mg Oral BID Emokpae, Ejiroghene E, MD   100 mg at 04/29/19 2247  . insulin aspart (novoLOG) injection 0-9 Units  0-9 Units Subcutaneous Q4H Emokpae, Ejiroghene E, MD      . isosorbide mononitrate (IMDUR) 24 hr tablet 60 mg  60 mg Oral Daily Emokpae, Ejiroghene E, MD      . nitroGLYCERIN (NITROSTAT) SL tablet 0.4 mg  0.4 mg Sublingual Q5 min PRN Emokpae, Ejiroghene E, MD   0.4  mg at 04/29/19 2027  . ondansetron (ZOFRAN) tablet 4 mg  4 mg Oral Q6H PRN Emokpae, Ejiroghene E, MD       Or  . ondansetron (ZOFRAN) injection 4 mg  4 mg Intravenous Q6H PRN Emokpae, Ejiroghene E, MD      . pantoprazole (PROTONIX) injection 40 mg  40 mg Intravenous Q12H Emokpae, Ejiroghene E, MD   40 mg at 04/30/19 0424  . polyethylene glycol (MIRALAX / GLYCOLAX) packet 17 g  17 g Oral Daily PRN Emokpae, Ejiroghene E, MD        Allergies as of 04/29/2019 - Review Complete 04/29/2019  Allergen Reaction Noted  . Morphine  02/09/2018    Family History  Problem Relation Age of Onset  . Heart disease Mother   . Heart disease Brother   . Diabetes Sister   . Colon cancer Brother   . Stomach cancer Sister   . Lupus Sister   . Heart disease Sister     Social History   Socioeconomic History  . Marital status: Married    Spouse name: Not on file  . Number of children: 5  . Years of education: Not on file  . Highest education level: Not on file  Occupational History  . Occupation: retired  Scientific laboratory technician  . Financial resource strain: Not hard at all  . Food insecurity    Worry: Patient refused    Inability: Patient refused  . Transportation needs    Medical: Patient refused    Non-medical: Patient refused  Tobacco Use  . Smoking status: Former Smoker    Packs/day: 1.00    Years: 20.00    Pack years: 20.00    Types: Cigars    Start date: 07/18/1993    Quit date: 10/02/2017    Years since quitting: 1.5  . Smokeless tobacco: Never Used  Substance and Sexual Activity  . Alcohol use: Yes    Alcohol/week: 0.0 standard drinks    Comment: Socially  . Drug use: Never  . Sexual activity: Not on file  Lifestyle  . Physical activity    Days per week: 0 days    Minutes per session: 0 min  . Stress: Not at all  Relationships  . Social connections  Talks on phone: More than three times a week    Gets together: Twice a week    Attends religious service: More than 4 times per  year    Active member of club or organization: No    Attends meetings of clubs or organizations: Never    Relationship status: Married  . Intimate partner violence    Fear of current or ex partner: Patient refused    Emotionally abused: Patient refused    Physically abused: Patient refused    Forced sexual activity: Patient refused  Other Topics Concern  . 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 [place] SEP 2020: 4 BIO, 1 STEP.    Review of Systems: Gen: Denies fever, chills, loss of appetite, change in weight or weight loss CV: see HPI Resp: Denies shortness of breath with rest, cough, wheezing GI: Denies dysphagia or odynophagia. Denies vomiting blood, jaundice, and fecal incontinence.  GU : Denies urinary burning, urinary frequency, urinary incontinence.  MS: chronic pedal/ankle edema Derm: Denies rash, itching, dry skin Psych: Denies depression, anxiety,confusion, or memory loss Heme: see HPI  Physical Exam: Vital signs in last 24 hours: Temp:  [98.5 F (36.9 C)-98.9 F (37.2 C)] 98.5 F (36.9 C) (07/13 0525) Pulse Rate:  [62-98] 89 (07/13 0756) Resp:  [13-27] 20 (07/13 0756) BP: (131-184)/(78-95) 131/85 (07/13 0756) SpO2:  [97 %-100 %] 100 % (07/13 0756) Weight:  [95.3 kg] 95.3 kg (07/12 1531) Last BM Date: 04/29/19 General:   Alert,  Well-developed, well-nourished, pleasant and cooperative in NAD Head:  Normocephalic and atraumatic. Eyes:  Sclera clear, no icterus.   Conjunctiva pink. Ears:  Normal auditory acuity. Nose:  No deformity, discharge,  or lesions. Mouth:  No deformity or lesions Lungs:  Clear throughout to auscultation.   Heart:  S1 S2 present, irregular Abdomen:  Soft, mild TTP LUQ but without rebound or guarding. Non-distended. No HSM. Normal bowel sounds Rectal:  Deferred  Msk:  Symmetrical without gross deformities. Normal posture. Pulses:  Normal pulses noted. Extremities:   With pedal edema  Neurologic:  Alert and  oriented x4 Psych:  Alert and cooperative. Normal mood and affect.  Intake/Output from previous day: 07/12 0701 - 07/13 0700 In: 72.4 [I.V.:72.4] Out: 650 [Urine:650] Intake/Output this shift: No intake/output data recorded.  Lab Results: Recent Labs    04/29/19 1544 04/29/19 2224 04/30/19 0536  WBC 5.0  --  4.9  HGB 11.1* 10.8* 10.9*  HCT 34.1* 33.3* 33.5*  PLT 248  --  222   BMET Recent Labs    04/29/19 1544 04/30/19 0536  NA 131* 135  K 2.8* 3.8  CL 98 101  CO2 22 23  GLUCOSE 184* 108*  BUN 16 13  CREATININE 1.38* 1.17  CALCIUM 9.4 9.3   LFT Recent Labs    04/29/19 1544  PROT 7.4  ALBUMIN 4.0  AST 26  ALT 24  ALKPHOS 70  BILITOT 0.6   PT/INR Recent Labs    04/29/19 1544  LABPROT 18.1*  INR 1.5*    Studies/Results: Ct Abdomen Pelvis W Contrast  Result Date: 04/29/2019 CLINICAL DATA:  Acute generalized abdominal pain beginning today. Melena. EXAM: CT ABDOMEN AND PELVIS WITH CONTRAST TECHNIQUE: Multidetector CT imaging of the abdomen and pelvis was performed using the standard protocol following bolus administration of intravenous contrast. CONTRAST:  54mL OMNIPAQUE IOHEXOL 300 MG/ML  SOLN COMPARISON:  None. FINDINGS: Lower Chest: No acute  findings. Bibasilar scarring. Hepatobiliary: Mild caudate hypertrophy and diffuse capsular nodularity, consistent with hepatic cirrhosis. No hepatic masses identified. No evidence of ascites. Prior cholecystectomy. No evidence of biliary obstruction. Pancreas:  No mass or inflammatory changes. Spleen: Within normal limits in size and appearance. Adrenals/Urinary Tract: No masses identified. Tiny cyst noted in lower pole of left kidney. No evidence of hydronephrosis. Stomach/Bowel: No evidence of obstruction, inflammatory process or abnormal fluid collections. Normal appendix visualized. A few diverticula are seen in the proximal sigmoid colon in the area of surgical staples, however  there is no evidence of diverticulitis. Vascular/Lymphatic: No pathologically enlarged lymph nodes. No abdominal aortic aneurysm. Aortic atherosclerosis. Reproductive:  No mass or other significant abnormality. Other:  None. Musculoskeletal:  No suspicious bone lesions identified. IMPRESSION: 1. Colonic diverticulosis, without radiographic evidence of diverticulitis or other acute findings. 2. Hepatic cirrhosis. No evidence of hepatic neoplasm or ascites. Aortic Atherosclerosis (ICD10-I70.0). Electronically Signed   By: Marlaine Hind M.D.   On: 04/29/2019 17:32   Dg Chest Portable 1 View  Result Date: 04/29/2019 CLINICAL DATA:  Episode of chest pain earlier today. Mild shortness of breath. EXAM: PORTABLE CHEST 1 VIEW COMPARISON:  None. FINDINGS: Lungs are clear. Cardiomegaly. No pneumothorax or pleural fluid. No acute or focal bony abnormality IMPRESSION: No acute disease. Cardiomegaly. Electronically Signed   By: Inge Rise M.D.   On: 04/29/2019 17:32    Impression: Very pleasant 85 year old male with known history of prior GI bleeding in setting of Xarelto and Plavix due to gastric AVMs s/p APC ablation in May 2020, with early interval EGD June 2020 due to acute on chronic anemia, melena, with findings of non-bleeding superficial gastric ulcers due to recent APC therapy. To conclude GI evaluation, capsule study attempted but incomplete; however, this showed a polypoid-type venous structure in small bowel, with plans for CTE as outpatient. Returning with chest pain this admission now resolved and black stool prior to admission while on Xarelto. Cardiology has verified with wife that Plavix and aspirin have not been taken as outpatient. Heme negative without further evidence for melena. Hgb stable since admission, and has improved since last discharge after 2 Feraheme infusions.   GI bleeding in this scenario most likely small bowel source, unable to exclude occult AVMs  in setting of anticoagulation.   As Hgb stable and heme negative, will follow clinically for now. He will need close outpatient follow-up with serial labs and establish care with Hematology due to [place].  Cardiology recommending continuing Xarelto when stable from a GI standpoint, but Plavix and aspirin will not be resumed due to recurrent GI bleeding and anemia.    Cirrhosis: new finding on imaging. Likely due to fatty liver, +/- ETOH abuse in past. No ETOH intake in many years.   Abnormal capsule study: discussed with radiology. Oral contrast from CT this admission could obscure small bowel abnormality. Will continue with plans for CTE as outpatient.   Plan: Clear liquids Follow for further overt GI bleeding, acute on chronic anemia Continue to hold Xarelto PPI BID CTE as outpatient Cirrhosis care as outpatient Avoiding plavix going forward and continuing to avoid aspirin Hematology as outpatient Need to obtain colonoscopy reports from Annamarie Major, PhD, ANP-BC [facility]     LOS: 0 days    04/30/2019, 9:08 AM

## 2019-04-30 NOTE — Care Management Obs Status (Signed)
[place] NOTIFICATION   Patient Details  Name: Antonio Watkins MRN: [id number] Date of Birth: [birth year]   Medicare Observation Status Notification Given:  Yes    Tommy Medal 04/30/2019, 1:36 PM

## 2019-04-30 NOTE — Progress Notes (Signed)
Initial Nutrition Assessment  DOCUMENTATION CODES:   Obesity unspecified  INTERVENTION:  D/c Ensure Enlive po BID- until pt diet advances to full liquids.  Start Boost breeze TID while on clears   NUTRITION DIAGNOSIS:   Inadequate oral intake related to acute illness as evidenced by (clear liquid diet).  GOAL:  Pt to meet >/= 90% of their estimated nutrition needs     MONITOR:  Po intake, labs and wt trends     REASON FOR ASSESSMENT:   Malnutrition Screening Tool    ASSESSMENT: Patient is an 85 yo male with history of DM-2, Gout, GERD, HTN, HLD, CAD, anemia and newly diagnosed cirrhosis/CT-abdomen/pelvis. He presents with complaint of melena.   Meal intake: clear liquids currently. Patient asking for steak today. Endorses good appetite and says his wife cooks for the them daily. Alternates eats eggs bacon or sausage vs cold cereals. At lunch they usually eat a snack then dinner a hot meal. Beverages include: tea, lemonade or water mostly.  Weights ranging primarily between 95-100 kg the past 4 months. Patient says he has lost around 5 lbs since early this year which is not significant for time frame. Patient reports ambulates independently in the community but is not as mobile as he used to be.    Medications reviewed and include: Novolog, Protonix   Labs: glu 108 otherwise BMP -wnl  NUTRITION - FOCUSED PHYSICAL EXAM:  Unable to complete Nutrition-Focused physical exam at this time.     Diet Order:   Diet Order            Diet clear liquid Room service appropriate? Yes; Fluid consistency: Thin  Diet effective now              EDUCATION NEEDS:   No education needs have been identified at this time Skin:  Skin Assessment: Reviewed RN Assessment  Last BM:  7/12  Height:   Ht Readings from Last 1 Encounters:  04/29/19 5\' 10"  (1.778 m)    Weight:   Wt Readings from Last 1 Encounters:  04/29/19 95.3 kg    Ideal Body Weight:  75 kg  BMI:  Body mass  index is 30.13 kg/m.  Estimated Nutritional Needs:   Kcal:  8889-1694  Protein:  97-105 gr  Fluid:  2.0-2.1 liters daily   Colman Cater MS,RD,CSG,LDN Office: [phone] Pager: [phone]

## 2019-04-30 NOTE — Progress Notes (Signed)
IV removed, patient tolerated well.  Reviewed AVS with patient who verbalized understanding. Patient transported to short stay lobby via wheelchair and transported home by his wife.

## 2019-04-30 NOTE — TOC Benefit Eligibility Note (Signed)
Transition of Care [facility]) Benefit Eligibility Note    Patient Details  Name: Antonio Watkins MRN: [id number] Date of Birth: [birth year]   Medication/Dose: plavix/generic clopidogrel 75mg   90 day supply  Covered?: Yes        Spoke with Person/Company/Phone Number:: [employer] [phone]           Additional Notes: medication was last filled in March 2020-[employer] [facility]    Tommy Medal Phone Number: 04/30/2019, 11:39 AM

## 2019-04-30 NOTE — Discharge Instructions (Signed)
1) you are taking Xarelto/rivaroxaban which is a blood thinner so Avoid ibuprofen/Advil/Aleve/Motrin/Goody Powders/Naproxen/BC powders/Meloxicam/Diclofenac/Indomethacin and other Nonsteroidal anti-inflammatory medications as these will make you more likely to bleed and can cause stomach ulcers, can also cause Kidney problems.   2) okay to restart Xarelto on Wednesday, 05/02/2019 if no further bleeding  3) no need to take aspirin and no need to take Plavix anymore  4) follow-up to primary care physician within a week for repeat CBC/complete blood count test  5) please call or return if any concerns about bleeding

## 2019-05-01 ENCOUNTER — Ambulatory Visit (HOSPITAL_COMMUNITY): Admission: RE | Admit: 2019-05-01 | Payer: Medicare PPO | Source: Ambulatory Visit

## 2019-05-02 NOTE — Telephone Encounter (Signed)
Patient is having a CTE later this month. We can see if capsule still present at that time as he never had abd xray.   FYI AB.

## 2019-05-02 NOTE — Telephone Encounter (Signed)
PT and his wife are aware.  She said the CT is scheduled too early and she would like to change the time.  I gave her the phone number to radiology to call.   Forwarding to [place] to schedule OV appt.

## 2019-05-02 NOTE — Telephone Encounter (Signed)
Xray on file. Capsule has passed. Please keep plans for CTE upcoming.  Needs appt in 4 weeks for hospital follow-up.

## 2019-05-03 ENCOUNTER — Encounter: Payer: Self-pay | Admitting: Gastroenterology

## 2019-05-03 NOTE — Telephone Encounter (Signed)
PATIENT SCHEDULED AND LETTER SENT  °

## 2019-05-16 ENCOUNTER — Ambulatory Visit: Payer: Medicare PPO | Admitting: Nurse Practitioner

## 2019-05-16 ENCOUNTER — Ambulatory Visit (HOSPITAL_COMMUNITY): Payer: Medicare PPO

## 2019-05-17 ENCOUNTER — Other Ambulatory Visit: Payer: Self-pay | Admitting: Cardiovascular Disease

## 2019-05-21 ENCOUNTER — Ambulatory Visit (HOSPITAL_COMMUNITY): Payer: Medicare PPO

## 2019-06-11 ENCOUNTER — Ambulatory Visit: Payer: Medicare PPO | Admitting: Gastroenterology

## 2019-07-08 NOTE — Progress Notes (Signed)
Referring Provider: Jacqualine Code, DO Primary Care Physician:  Jacqualine Code, DO Primary GI Physician: Dr. Oneida Alar  Chief Complaint  Patient presents with  . dark stool    HPI:   Antonio Watkins is a 85 y.o. 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 Hospitalized from 03/21/19-03/22/19 for melena. Repeat EGD with non-bleeding ulcers resulting from APC ablation of AVMs. Follow-up capsule study on 03/22/19 not complete to the cecum. Polypoid-type lesion with vascular component noted in small bowel, with plans for CTE for further evaluation. Query blue rubber bleb nevus. Hemoglobin remained stable at 8.1 on discharge. Ferritin 11. Patient was instructed to take Xarelto only. Feraheme received 7/2 and 7/9, and he was referred to Hematology. He was again admitted from 04/29/19-04/30/19 with complaint of chest pain and reporting black stool x 1. Admitting Hgb 11.1. Stool heme negative. Hgb remained stable and patient had no overt GI bleeding. No acute cardiac etiology. CT abdomen and pelvis with contrast revealed new finding of hepatic cirrhosis. LFTs were normal. Recommended close follow-up with serial labs as outpatient and continue with plans for CTE as outpatient. PPI BID.    Today he presents with his wife and states he saw his PCP about 1 month ago. Reports they did blood work and he was told everything looked good. Continues to have black stools daily. States they never stopped. Not on iron or taking pepto. Has not established with hematology. No abdominal pain. No nausea or vomiting. No GERD symptoms or dysphagia. No NSAIDs. No lightheadedness or dizziness. Taking Xarelto. Not sure if he is taking Protonix.   BMs 3-4 times a week. Takes a laxative about once a week (MOM) Hasn't taken about 2-3 weeks.  Taking mineral oil. Stools are hard. No bright red blood per rectum.   Cirrhosis: History of significant alcohol use years ago. 20-30 years ago. Now drinking alcohol rarely. Maybe 2-3 times a year. No history of drug use. No known exposure to hepatitis. No tattoos. No incarcerations. No swelling in abdomen or lower legs, yellowing of eyes, confusion, or easy bruising.   Past Medical History:  Diagnosis Date  . Anginal pain ([place])   . Atrial fibrillation ([place])   . CAD (coronary artery disease)   . Cancer of sigmoid colon ([place])    approx 1990  . DM type 2 (diabetes mellitus, type 2) ([place])   . GERD (gastroesophageal reflux disease)   . Gout   . Hiatal hernia   . Hyperlipemia   . Hypertension   . Incontinence     Past Surgical History:  Procedure Laterality Date  . BIOPSY  03/09/2019   Procedure: BIOPSY;  Surgeon: Danie Binder, MD;  Location: [facility];  Service: Endoscopy;;  . cataracts    . CHOLECYSTECTOMY    . COLONOSCOPY     STOPPED BREATHING  . CORONARY STENT INTERVENTION  12/06/2018  . CORONARY STENT INTERVENTION N/A 12/06/2018   Procedure: CORONARY STENT INTERVENTION;  Surgeon: Jettie Booze, MD;  Location: [facility];  Service: Cardiovascular;  Laterality: N/A;  . 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   . ESOPHAGOGASTRODUODENOSCOPY N/A 03/21/2019   non-bleeding ulcers resulting from APC ablation of AVMs  . GIVENS CAPSULE STUDY N/A  03/22/2019    not complete to the cecum. However, a polypoid-type lesion with vascular component noted in small bowel, with plans for CTE for further evaluation. Query blue rubber bleb nevus  . MINOR CARPAL TUNNEL    . REPLACEMENT TOTAL KNEE    . RIGHT/LEFT HEART CATH AND CORONARY ANGIOGRAPHY N/A 12/06/2018   Procedure: RIGHT/LEFT HEART CATH AND CORONARY ANGIOGRAPHY;  Surgeon: Jettie Booze, MD;  Location: [place] [facility];  Service: Cardiovascular;  Laterality:  N/A;    Current Outpatient Medications  Medication Sig Dispense Refill  . acetaminophen (TYLENOL) 325 MG tablet Take 2 tablets (650 mg total) by mouth every 6 (six) hours as needed for mild pain (or Fever >/= 101). 12 tablet 0  . albuterol (PROVENTIL HFA;VENTOLIN HFA) 108 (90 Base) MCG/ACT inhaler Inhale 1 puff into the lungs every 6 (six) hours as needed for wheezing or shortness of breath. 1 Inhaler 2  . atorvastatin (LIPITOR) 40 MG tablet TAKE 1 AND 1/2 TABLETS EVERY DAY (Patient taking differently: Take 60 mg by mouth daily. ) 135 tablet 2  . hydrALAZINE (APRESOLINE) 100 MG tablet TAKE 1 TABLET THREE TIMES DAILY ( DOSE INCREASE ) 270 tablet 1  . hydrochlorothiazide (HYDRODIURIL) 12.5 MG tablet Take 12.5 mg by mouth daily with breakfast.     . isosorbide mononitrate (IMDUR) 60 MG 24 hr tablet Take 60 mg by mouth daily.    Marland Kitchen losartan (COZAAR) 100 MG tablet Take 100 mg by mouth daily.    . magnesium hydroxide (MILK OF MAGNESIA) 400 MG/5ML suspension Take 15 mLs by mouth daily as needed for mild constipation.    . Multiple Vitamin (MULTIVITAMIN WITH MINERALS) TABS tablet Take 1 tablet by mouth daily. Centrum Silver    . nitroGLYCERIN (NITROSTAT) 0.4 MG SL tablet DISSOLVE 1 TABLET UNDER THE TONGUE EVERY 5 MINUTES AS NEEDED FOR CHEST PAIN 30 tablet 3  . Omega-3 Fatty Acids (OMEGA-3 FISH OIL) 1200 MG CAPS Take 1,200 mg by mouth 2 (two) times a day.     . pantoprazole (PROTONIX) 40 MG tablet Take 1 tablet (40 mg total) by mouth 2 (two) times daily before a meal. 180 tablet 3  . Polyethyl Glycol-Propyl Glycol (SYSTANE ULTRA OP) Place 1 drop into both eyes daily as needed (dry eyes).    . rivaroxaban (XARELTO) 20 MG TABS tablet Take 1 tablet (20 mg total) by mouth daily with supper. 90 tablet 3  . vitamin B-12 (CYANOCOBALAMIN) 1000 MCG tablet Take 1,000 mcg by mouth every Sunday.     No current facility-administered medications for this visit.     Allergies as of 07/09/2019 - Review Complete  07/09/2019  Allergen Reaction Noted  . Morphine  02/09/2018    Family History  Problem Relation Age of Onset  . Heart disease Mother   . Heart disease Brother   . Diabetes Sister   . Colon cancer Brother        over age 66  . Stomach cancer Sister   . Lupus Sister   . Heart disease Sister     Social History   Socioeconomic History  . Marital status: Married    Spouse name: Not on file  . Number of children: 5  . Years of education: Not on file  . Highest education level: Not on file  Occupational History  . Occupation: retired  Scientific laboratory technician  . Financial resource strain: Not hard at all  . Food insecurity    Worry: Patient refused    Inability:  Patient refused  . Transportation needs    Medical: Patient refused    Non-medical: Patient refused  Tobacco Use  . Smoking status: Current Some Day Smoker    Packs/day: 1.00    Years: 20.00    Pack years: 20.00    Types: Cigars    Start date: 07/18/1993    Last attempt to quit: 10/02/2017    Years since quitting: 1.7  . Smokeless tobacco: Never Used  Substance and Sexual Activity  . Alcohol use: Yes    Alcohol/week: 0.0 standard drinks    Comment: Socially  . Drug use: Never  . Sexual activity: Not on file  Lifestyle  . Physical activity    Days per week: 0 days    Minutes per session: 0 min  . Stress: Not at all  Relationships  . Social connections    Talks on phone: More than three times a week    Gets together: Twice a week    Attends religious service: More than 4 times per year    Active member of club or organization: No    Attends meetings of clubs or organizations: Never    Relationship status: Married  Other Topics Concern  . 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 [place] SEP 2020: 4 BIO, 1 STEP.    Review of Systems: Gen: Denies fever CV: Denies chest pain, palpitations. Resp: Denies dyspnea at rest, cough GI: See HPI Derm:  Denies rash Psych: Denies depression, anxiety Heme: See HPI  Physical Exam: BP 129/76   Pulse 70   Temp (!) 95.9 F (35.5 C) (Temporal)   Ht 5' 10.5" (1.791 m)   Wt 202 lb 6.4 oz (91.8 kg)   BMI 28.63 kg/m  General:   Alert and oriented. No distress noted. Pleasant and cooperative.  Head:  Normocephalic and atraumatic. Eyes:  Conjuctiva clear without scleral icterus. Heart:  S1, S2 present without murmurs appreciated. Lungs:  Clear to auscultation bilaterally. No wheezes, rales, or rhonchi. No distress.  Abdomen:  +BS, soft, non-tender and non-distended. No rebound or guarding. No HSM or masses noted. Msk:  Symmetrical without gross deformities. Normal posture. Extremities:  Without edema. Neurologic:  Alert and  oriented x4 Psych:  Normal mood and affect.

## 2019-07-09 ENCOUNTER — Ambulatory Visit (INDEPENDENT_AMBULATORY_CARE_PROVIDER_SITE_OTHER): Payer: Medicare PPO | Admitting: Gastroenterology

## 2019-07-09 ENCOUNTER — Other Ambulatory Visit: Payer: Self-pay | Admitting: *Deleted

## 2019-07-09 ENCOUNTER — Other Ambulatory Visit: Payer: Self-pay

## 2019-07-09 ENCOUNTER — Encounter: Payer: Self-pay | Admitting: Gastroenterology

## 2019-07-09 VITALS — BP 129/76 | HR 70 | Temp 95.9°F | Ht 70.5 in | Wt 202.4 lb

## 2019-07-09 DIAGNOSIS — K921 Melena: Secondary | ICD-10-CM | POA: Diagnosis not present

## 2019-07-09 DIAGNOSIS — K7460 Unspecified cirrhosis of liver: Secondary | ICD-10-CM | POA: Diagnosis not present

## 2019-07-09 DIAGNOSIS — K5900 Constipation, unspecified: Secondary | ICD-10-CM

## 2019-07-09 DIAGNOSIS — K6389 Other specified diseases of intestine: Secondary | ICD-10-CM | POA: Diagnosis not present

## 2019-07-09 DIAGNOSIS — D500 Iron deficiency anemia secondary to blood loss (chronic): Secondary | ICD-10-CM

## 2019-07-09 NOTE — Patient Instructions (Addendum)
Please call when you get home to give [place] an updated medication list.  Please have CT enterography completed.  Please have labs completed.  You may add MiraLAX 1 capful (17 g) daily mixed in 8 ounces of water for constipation.  If your bowels get loose you may decrease the frequency.  Continue to monitor black stools.  If you develop lightheadedness, dizziness, feeling like you are going to pass out, or significant fatigue you should go to the ED.  You should continue to avoid alcohol.  No more than 2 g of Tylenol daily with your new diagnosis of cirrhosis.  You should follow a low-sodium diet.  Less than 2 g daily.  We will follow-up with you in 6 months.  Call if questions or concerns prior.  Aliene Altes, PA-C [facility]   Cirrhosis  Cirrhosis is long-term (chronic) liver injury. The liver is the body's largest internal organ, and it performs many functions. It converts food into energy, removes toxic material from the blood, makes important proteins, and absorbs necessary vitamins from food. In cirrhosis, healthy liver cells are replaced by scar tissue. This prevents blood from flowing through the liver, making it difficult for the liver to function. Scarring of the liver cannot be reversed, but treatment can prevent it from getting worse. What are the causes? Common causes of this condition are hepatitis C and long-term alcohol abuse. Other causes include:  Nonalcoholic fatty liver disease. This happens when fat is deposited in the liver by causes other than alcohol.  Hepatitis B infection.  Autoimmune hepatitis. In this condition, the body's defense system (immune system) mistakenly attacks the liver cells, causing irritation and swelling (inflammation).  Diseases that cause blockage of ducts inside the liver.  Inherited liver diseases, such as hemochromatosis. This is one of the most common inherited liver diseases. In this disease, deposits of iron  collect in the liver and other organs.  Reactions to certain long-term medicines, such as amiodarone, a heart medicine.  Parasitic infections. These include schistosomiasis, which is caused by a flatworm.  Long-term contact to certain toxins. These toxins include certain organic solvents, such as toluene and chloroform. What increases the risk? You are more likely to develop this condition if:  You have certain types of viral hepatitis.  You abuse alcohol, especially if you are male.  You are overweight.  You share needles.  You have unprotected sex with someone who has viral hepatitis. What are the signs or symptoms? You may not have any signs and symptoms at first. Symptoms may not develop until the damage to your liver starts to get worse. Early symptoms may include:  Weakness and tiredness (fatigue).  Changes in sleep patterns or having trouble sleeping.  Itchiness.  Tenderness in the right-upper part of your abdomen.  Weight loss and muscle loss.  Nausea.  Loss of appetite.  Appearance of tiny blood vessels under the skin. Later symptoms may include:  Fatigue or weakness that is getting worse.  Yellow skin and eyes (jaundice).  Buildup of fluid in the abdomen (ascites). You may notice that your clothes are tight around your waist.  Weight gain.  Swelling of the feet and ankles (edema).  Trouble breathing.  Easy bruising and bleeding.  Vomiting blood.  Black or bloody stool.  Mental confusion. How is this diagnosed? Your health care provider may suspect cirrhosis based on your symptoms and medical history, especially if you have other medical conditions or a history of alcohol abuse. Your health care provider  will do a physical exam to feel your liver and to check for signs of cirrhosis. He or she may perform other tests, including:  Blood tests to check: ? For hepatitis B or C. ? Kidney function. ? Liver function.  Imaging tests such  as: ? MRI or CT scan to look for changes seen in advanced cirrhosis. ? Ultrasound to see if normal liver tissue is being replaced by scar tissue.  A procedure in which a long needle is used to take a sample of liver tissue to be checked in a lab (biopsy). Liver biopsy can confirm the diagnosis of cirrhosis. How is this treated? Treatment for this condition depends on how damaged your liver is and what caused the damage. It may include treating the symptoms of cirrhosis, or treating the underlying causes in order to slow the damage. Treatment may include:  Making lifestyle changes, such as: ? Eating a healthy diet. You may need to work with your health care provider or a diet and nutrition specialist (dietitian) to develop an eating plan. ? Restricting salt intake. ? Maintaining a healthy weight. ? Not abusing drugs or alcohol.  Taking medicines to: ? Treat liver infections or other infections. ? Control itching. ? Reduce fluid buildup. ? Reduce certain blood toxins. ? Reduce risk of bleeding from enlarged blood vessels in the stomach or esophagus (varices).  Liver transplant. In this procedure, a liver from a donor is used to replace your diseased liver. This is done if cirrhosis has caused liver failure. Other treatments and procedures may be done depending on the problems that you get from cirrhosis. Common problems include liver-related kidney failure (hepatorenal syndrome). Follow these instructions at home:   Take medicines only as told by your health care provider. Do not use medicines that are toxic to your liver. Ask your health care provider before taking any new medicines, including over-the-counter medicines.  Rest as needed.  Eat a well-balanced diet. Ask your health care provider or dietitian for more information.  Limit your salt or water intake, if your health care provider asks you to do this.  Do not drink alcohol. This is especially important if you are taking  acetaminophen.  Keep all follow-up visits as told by your health care provider. This is important. Contact a health care provider if you:  Have fatigue or weakness that is getting worse.  Develop swelling of the hands, feet, legs, or face.  Have a fever.  Develop loss of appetite.  Have nausea or vomiting.  Develop jaundice.  Develop easy bruising or bleeding. Get help right away if you:  Vomit bright red blood or a material that looks like coffee grounds.  Have blood in your stools.  Notice that your stools appear black and tarry.  Become confused.  Have chest pain or trouble breathing. Summary  Cirrhosis is chronic liver injury. Liver damage cannot be reversed. Common causes are hepatitis C and long-term alcohol abuse.  Tests used to diagnose cirrhosis include blood tests, imaging tests, and liver biopsy.  Treatment for this condition involves treating the underlying cause. Avoid alcohol, drugs, salt, and medicines that may damage your liver.  Contact your health care provider if you develop ascites, edema, jaundice, fever, nausea or vomiting, easy bruising or bleeding, or worsening fatigue. This information is not intended to replace advice given to you by your health care provider. Make sure you discuss any questions you have with your health care provider. Document Released: 10/04/2005 Document Revised: 01/24/2019 Document Reviewed:  08/24/2017 Elsevier Patient Education  [place].

## 2019-07-10 ENCOUNTER — Other Ambulatory Visit: Payer: Self-pay

## 2019-07-10 ENCOUNTER — Telehealth: Payer: Self-pay | Admitting: *Deleted

## 2019-07-10 ENCOUNTER — Telehealth: Payer: Self-pay | Admitting: Gastroenterology

## 2019-07-10 DIAGNOSIS — D509 Iron deficiency anemia, unspecified: Secondary | ICD-10-CM

## 2019-07-10 NOTE — Assessment & Plan Note (Addendum)
New diagnosis of cirrhosis via CT during recent admission.  Suspect this is secondary to NASH.  Possible influence of alcohol abuse in patient's distant past.  No history of drug use.  No significant risk factors for hepatitis exposure. LFTs remain within normal limits.  Meld 14 based on inpatient labs. EGD in June 2020 without varices. Patient is without any signs or symptoms of decompensation.   Will obtain hepatitis labs to rule out chronic infectious as well as establish immunity status as he will need vaccination against Hep A and B if not already immune.  AFP for baseline Will need CBC, HFP, BMP, INR, AFP, and RUQ [place] in January 2020  Will need esophageal varices training in 2 years. Continue to avoid alcohol.  Follow low sodium diet. <2g daily.  No more than 2 g of Tylenol daily. Follow-up in 6 months

## 2019-07-10 NOTE — Telephone Encounter (Signed)
CTE scheduled for 10/2 at 8:00am, arrival time 7:45am, npo midnight.  Called patient and is aware of appt information.  PA submitted via [employer]. PA pending. Tracking # [id number]

## 2019-07-10 NOTE — Assessment & Plan Note (Signed)
Polypoid-type lesion with vascular component noted in small bowel on [place] capsule study.  Dr. Roseanne Kaufman recommendations, will pursue CTE for further evaluation.

## 2019-07-10 NOTE — Assessment & Plan Note (Deleted)
Referring to hematology.

## 2019-07-10 NOTE — Assessment & Plan Note (Addendum)
85 year old male with CAD s/p DES and angioplasty in February 2020, A. 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  He was hospitalized from 03/21/2019-03/22/2019 for melena.  Repeat EGD at that time with nonbleeding ulcers resulting from [facility] ablation of AVMs.  Capsule study on 03/22/2019 not complete to the cecum but polypoid type lesion with vascular component identified in the small bowel.  Hemoglobin during hospitalization remained stable and was at 8.1 on discharge and patient was instructed to only take Xarelto.  His ferritin was noted to be low at 11 and he subsequently received 2 Feraheme infusions.  Again admitted from 04/29/2019-04/30/2019 with chest pain and black stools.  Admitting hemoglobin 11.1.  Stool heme-negative.  Hemoglobin remained stable and patient had no overt GI bleeding.  Today patient reports he continues to have black stools.  States they have never stopped.  He is not on oral iron or taking Pepto.  Denies abdominal pain, GERD symptoms, nausea, vomiting, lightheadedness, or dizziness.  No NSAIDs.  Taking Xarelto only.  Not sure if he is taking Protonix.  Patient reports having blood work with his PCP "look good."  May be secondary to unhealed gastric ulcers vs gastric AVMs vs small bowel etiology related to lesion noted on capsule study or other small bowel AVMs.   Recheck CBC today for stability.  If hemoglobin is downtrending, patient will likely need repeat EGD. Schedule CTE for further evaluation of small bowel polyp. Further recommendations to follow. Requested patient to call with updated medication list to verify whether or not he is taking Protonix as he needs to be taking this BID.  Advised to go to the ED if he develops lightheadedness, dizziness, feeling like he would pass out, or significant fatigue. Also referring to hematology for anemia and iron deficiency requiring feraheme.

## 2019-07-10 NOTE — Telephone Encounter (Signed)
Antonio Watkins, can you call patient to verify medication list?  He was not sure if he was taking Protonix at the time of our office visit.  I had requested he call our office when he got home but I do not see that this was done.  Also, the future labs that were placed had a diagnosis of iron deficiency anemia.  These labs are for his new diagnosis of cirrhosis not anemia.  I cosigned the orders before I realized the diagnosis was incorrect. Could we re-enter these orders with the correct diagnosis and make them for late January rather than March? Patient is also going to need an ultrasound at that time as well.

## 2019-07-10 NOTE — Assessment & Plan Note (Signed)
BMs 3-4 times a week. Stools are hard. Taking milk of magnesia about once a week and mineral oil daily. Start MiraLAX 1 capful (17 g) daily mixed in 8 ounces of water.  If stools become loose, decrease the frequency.

## 2019-07-11 ENCOUNTER — Telehealth: Payer: Self-pay

## 2019-07-11 ENCOUNTER — Other Ambulatory Visit: Payer: Self-pay

## 2019-07-11 DIAGNOSIS — K7460 Unspecified cirrhosis of liver: Secondary | ICD-10-CM

## 2019-07-11 NOTE — Telephone Encounter (Signed)
PT called with med list. He said he is NOT TAKING Protonix and Xarelto Forwarding to [place].

## 2019-07-11 NOTE — Telephone Encounter (Signed)
Orders have been changed with new Dx cirrhosis. Spoke with pt's spouse and they will call back today with an updated medication list after pt's appointment.

## 2019-07-12 NOTE — Telephone Encounter (Signed)
Checked PA status and still pending

## 2019-07-13 NOTE — Telephone Encounter (Signed)
PA approved. Auth# [id number] dtaes 07/20/2019-08/19/2019

## 2019-07-13 NOTE — Telephone Encounter (Signed)
Checked PA, still pending

## 2019-07-16 NOTE — Patient Instructions (Signed)
Lab results received. AFP, Hep A tot, Hep B core tot, Hep B surface AB, Hep C AB. Results WNL.  Results placed in [facility] box.

## 2019-07-18 ENCOUNTER — Encounter: Payer: Self-pay | Admitting: Gastroenterology

## 2019-07-20 ENCOUNTER — Encounter (HOSPITAL_COMMUNITY): Payer: Self-pay | Admitting: *Deleted

## 2019-07-20 ENCOUNTER — Ambulatory Visit (HOSPITAL_COMMUNITY): Payer: Medicare PPO

## 2019-07-23 ENCOUNTER — Encounter (HOSPITAL_COMMUNITY): Payer: Self-pay | Admitting: Hematology

## 2019-07-23 ENCOUNTER — Other Ambulatory Visit (HOSPITAL_COMMUNITY): Payer: Medicare PPO

## 2019-07-23 ENCOUNTER — Other Ambulatory Visit: Payer: Self-pay

## 2019-07-23 ENCOUNTER — Inpatient Hospital Stay (HOSPITAL_COMMUNITY): Payer: Medicare PPO | Attending: Hematology | Admitting: Hematology

## 2019-07-23 DIAGNOSIS — E119 Type 2 diabetes mellitus without complications: Secondary | ICD-10-CM | POA: Diagnosis not present

## 2019-07-23 DIAGNOSIS — Z832 Family history of diseases of the blood and blood-forming organs and certain disorders involving the immune mechanism: Secondary | ICD-10-CM | POA: Diagnosis not present

## 2019-07-23 DIAGNOSIS — Z833 Family history of diabetes mellitus: Secondary | ICD-10-CM

## 2019-07-23 DIAGNOSIS — E785 Hyperlipidemia, unspecified: Secondary | ICD-10-CM | POA: Diagnosis not present

## 2019-07-23 DIAGNOSIS — D649 Anemia, unspecified: Secondary | ICD-10-CM | POA: Insufficient documentation

## 2019-07-23 DIAGNOSIS — Z85038 Personal history of other malignant neoplasm of large intestine: Secondary | ICD-10-CM | POA: Insufficient documentation

## 2019-07-23 DIAGNOSIS — F17290 Nicotine dependence, other tobacco product, uncomplicated: Secondary | ICD-10-CM | POA: Diagnosis not present

## 2019-07-23 DIAGNOSIS — I10 Essential (primary) hypertension: Secondary | ICD-10-CM | POA: Diagnosis not present

## 2019-07-23 DIAGNOSIS — R5383 Other fatigue: Secondary | ICD-10-CM | POA: Insufficient documentation

## 2019-07-23 DIAGNOSIS — Z87891 Personal history of nicotine dependence: Secondary | ICD-10-CM | POA: Insufficient documentation

## 2019-07-23 DIAGNOSIS — K259 Gastric ulcer, unspecified as acute or chronic, without hemorrhage or perforation: Secondary | ICD-10-CM | POA: Insufficient documentation

## 2019-07-23 DIAGNOSIS — I2510 Atherosclerotic heart disease of native coronary artery without angina pectoris: Secondary | ICD-10-CM | POA: Diagnosis not present

## 2019-07-23 DIAGNOSIS — I998 Other disorder of circulatory system: Secondary | ICD-10-CM

## 2019-07-23 DIAGNOSIS — Z8249 Family history of ischemic heart disease and other diseases of the circulatory system: Secondary | ICD-10-CM | POA: Insufficient documentation

## 2019-07-23 DIAGNOSIS — Z800 Family history of malignant neoplasm of digestive organs: Secondary | ICD-10-CM | POA: Insufficient documentation

## 2019-07-23 DIAGNOSIS — R200 Anesthesia of skin: Secondary | ICD-10-CM | POA: Insufficient documentation

## 2019-07-23 DIAGNOSIS — Z79899 Other long term (current) drug therapy: Secondary | ICD-10-CM | POA: Diagnosis not present

## 2019-07-23 NOTE — Patient Instructions (Signed)
[place] at [facility] Discharge Instructions  You were seen today by Dr. Delton Coombes. He went over your history, family history and how you've been feeling latley. He will have blood drawn today. He will see you back in 1 week for follow up.   Thank you for choosing [place] at [facility] to provide your oncology and hematology care.  To afford each patient quality time with our provider, please arrive at least 15 minutes before your scheduled appointment time.   If you have a lab appointment with the [facility] please come in thru the  Main Entrance and check in at the main information desk  You need to re-schedule your appointment should you arrive 10 or more minutes late.  We strive to give you quality time with our providers, and arriving late affects you and other patients whose appointments are after yours.  Also, if you no show three or more times for appointments you may be dismissed from the clinic at the providers discretion.     Again, thank you for choosing [facility].  Our hope is that these requests will decrease the amount of time that you wait before being seen by our physicians.       _____________________________________________________________  Should you have questions after your visit to [facility], please contact our office at [phone] between the hours of 8:00 a.m. and 4:30 p.m.  Voicemails left after 4:00 p.m. will not be returned until the following business day.  For prescription refill requests, have your pharmacy contact our office and allow 72 hours.    Cancer Center Support Programs:   > Cancer Support Group  2nd Tuesday of the month 1pm-2pm, Journey Room

## 2019-07-23 NOTE — Assessment & Plan Note (Addendum)
1.  Normocytic anemia: -EGD on 03/09/2019 shows 1 of 4 small angiectasias with bleeding in the cardia and in the gastric body, APC was done. -Repeat EGD on 03/21/2019 shows normal esophagus, nonbleeding superficial gastric ulcer at fundus resulting from [facility] ablation of AVM.  He reportedly underwent capsule study also. - Last CBC on 04/30/2019 shows hemoglobin 10.9 with MCV of 85.7.  CBC on 03/22/2019 shows hemoglobin 8.1 with MCV of 82.6. - He has been taking iron tablets 3 times a day since June 2020.  He reports dark stools. -He is also taking B12 tablet daily at a dose of 1 mg. -No prior history of blood transfusions.  Xarelto and Plavix have been discontinued due to anemia. - He received Feraheme on 04/19/2019 and 04/26/2019.  He tolerated them very well. -We will check his CBC and rule out nutritional deficiencies by checking B12, methylmalonic acid, folic acid, copper levels.  We will also check his ferritin and iron panel.  We will send serum protein electrophoresis.  We will also check LDH and reticulocyte count.  We will check his stool for occult blood. - Based on his ferritin and iron panel, will decide if he needs further iron infusions.  2.  Colon cancer: - He had colon cancer resected 20-25 years ago.  Did not receive any adjuvant chemotherapy. - Patient had a brother who died of colon cancer.

## 2019-07-23 NOTE — Progress Notes (Signed)
CONSULT NOTE  Patient Care Team: Jacqualine Code, DO as PCP - General (Family Medicine) Herminio Commons, MD as PCP - Cardiology (Cardiology) Danie Binder, MD as Consulting Physician (Gastroenterology)  CHIEF COMPLAINTS/PURPOSE OF CONSULTATION:  Normocytic anemia.  HISTORY OF PRESENTING ILLNESS:  Antonio Watkins 85 y.o. male is seen in consultation today for further work-up and management of normocytic anemia.  CBC on 04/30/2019 shows hemoglobin 10.9 with MCV of 85.7.  Hemoglobin on 03/22/2019 was 8.1 with MCV of 82.6.  He was hospitalized couple of times with anemia.  He underwent EGD on 03/09/2019 which showed small angiectasia is in the stomach and underwent APC treatment.  Repeat EGD on 03/21/2019 shows normal esophagus, nonbleeding superficial gastric ulcer at fundus.  He reportedly underwent capsule study also.  He has been taking iron tablets 3 times a day since June.  Xarelto and Plavix have been held.  He did receive Feraheme infusions on 04/19/2019 and 04/26/2019.  He is also taking vitamin B12 tablet daily.  He does report some tiredness with energy levels at 25%.  Appetite is at 50%.  Numbness in the feet has been stable.  He denies any pica.  Denies any shortness of breath on minimal exertion, presyncopal episodes or palpitations.  Did not notice any hematuria or hematochezia.  Denies any over-the-counter NSAID ingestion.  He had remote history of colon cancer 20 to 25 years ago and underwent resection of the colon.  He did not require any adjuvant treatments.  He denies any history of blood transfusions. He worked as a Chief of Staff.  He smoked 1 pack/day of cigarettes for 40 years and quit 20 years ago.  He smokes 1 cigar every 1 to 3 months.  Family history significant for brother who died of colon cancer.  MEDICAL HISTORY:  Past Medical History:  Diagnosis Date  . Anginal pain ([place])   . Atrial fibrillation ([place])   . CAD (coronary artery disease)   . Cancer  of sigmoid colon ([place])    approx 1990  . DM type 2 (diabetes mellitus, type 2) ([place])   . GERD (gastroesophageal reflux disease)   . Gout   . Hiatal hernia   . Hyperlipemia   . Hypertension   . Incontinence     SURGICAL HISTORY: Past Surgical History:  Procedure Laterality Date  . BIOPSY  03/09/2019   Procedure: BIOPSY;  Surgeon: Danie Binder, MD;  Location[facility];  Service: Endoscopy;;  . cataracts    . CHOLECYSTECTOMY    . COLONOSCOPY     STOPPED BREATHING  . CORONARY STENT INTERVENTION  12/06/2018  . CORONARY STENT INTERVENTION N/A 12/06/2018   Procedure: CORONARY STENT INTERVENTION;  Surgeon: Jettie Booze, MD;  Location: [place] [facility];  Service: Cardiovascular;  Laterality: N/A;  . 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   . ESOPHAGOGASTRODUODENOSCOPY N/A 03/21/2019   non-bleeding ulcers resulting from APC ablation of AVMs  . GIVENS CAPSULE STUDY N/A 03/22/2019    not complete to the cecum. However, a polypoid-type lesion with vascular component noted in small bowel, with plans for CTE for further evaluation. Query blue rubber bleb nevus  . MINOR CARPAL TUNNEL    . REPLACEMENT TOTAL KNEE    . RIGHT/LEFT HEART CATH AND CORONARY ANGIOGRAPHY N/A 12/06/2018   Procedure: RIGHT/LEFT HEART CATH AND CORONARY ANGIOGRAPHY;  Surgeon: Jettie Booze, MD;  Location: [place] [facility];  Service:  Cardiovascular;  Laterality: N/A;    SOCIAL HISTORY: Social History   Socioeconomic History  . Marital status: Married    Spouse name: Not on file  . Number of children: 5  . Years of education: Not on file  . Highest education level: Not on file  Occupational History  . Occupation: retired  Scientific laboratory technician  . Financial resource strain: Not hard at all  . Food insecurity    Worry: Patient refused    Inability: Patient refused  . Transportation needs    Medical: Patient refused    Non-medical: Patient  refused  Tobacco Use  . Smoking status: Current Some Day Smoker    Packs/day: 1.00    Years: 20.00    Pack years: 20.00    Types: Cigars    Start date: 07/18/1993    Last attempt to quit: 10/02/2017    Years since quitting: 1.8  . Smokeless tobacco: Never Used  Substance and Sexual Activity  . Alcohol use: Yes    Alcohol/week: 0.0 standard drinks    Comment: Socially  . Drug use: Never  . Sexual activity: Not on file  Lifestyle  . Physical activity    Days per week: 0 days    Minutes per session: 0 min  . Stress: Not at all  Relationships  . Social connections    Talks on phone: More than three times a week    Gets together: Twice a week    Attends religious service: More than 4 times per year    Active member of club or organization: No    Attends meetings of clubs or organizations: Never    Relationship status: Married  . Intimate partner violence    Fear of current or ex partner: Patient refused    Emotionally abused: Patient refused    Physically abused: Patient refused    Forced sexual activity: Patient refused  Other Topics Concern  . 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 [place] SEP 2020: 4 BIO, 1 STEP.    FAMILY HISTORY: Family History  Problem Relation Age of Onset  . Heart disease Mother   . Heart disease Brother   . Diabetes Sister   . Colon cancer Brother        over age 16  . Stomach cancer Sister   . Lupus Sister   . Heart disease Sister     ALLERGIES:  is allergic to morphine.  MEDICATIONS:  Current Outpatient Medications  Medication Sig Dispense Refill  . atorvastatin (LIPITOR) 40 MG tablet TAKE 1 AND 1/2 TABLETS EVERY DAY (Patient taking differently: Take 60 mg by mouth daily. ) 135 tablet 2  . donepezil (ARICEPT) 5 MG tablet Take 5 mg by mouth at bedtime.    . ferrous sulfate 325 (65 FE) MG EC tablet Take 325 mg by mouth 3 (three) times daily with meals. Pt said he only  takes it twice a day    . hydrALAZINE (APRESOLINE) 100 MG tablet TAKE 1 TABLET THREE TIMES DAILY ( DOSE INCREASE ) 270 tablet 1  . hydrochlorothiazide (HYDRODIURIL) 12.5 MG tablet Take 12.5 mg by mouth daily with breakfast.     . isosorbide mononitrate (IMDUR) 60 MG 24 hr tablet Take 60 mg by mouth daily.    Marland Kitchen losartan (COZAAR) 100 MG tablet Take 100 mg by mouth daily.    . vitamin B-12 (CYANOCOBALAMIN) 1000 MCG tablet Take 1,000 mcg  by mouth every Sunday.    Marland Kitchen albuterol (PROVENTIL HFA;VENTOLIN HFA) 108 (90 Base) MCG/ACT inhaler Inhale 1 puff into the lungs every 6 (six) hours as needed for wheezing or shortness of breath. (Patient not taking: Reported on 07/11/2019) 1 Inhaler 2  . nitroGLYCERIN (NITROSTAT) 0.4 MG SL tablet DISSOLVE 1 TABLET UNDER THE TONGUE EVERY 5 MINUTES AS NEEDED FOR CHEST PAIN (Patient not taking: Reported on 07/20/2019) 30 tablet 3   No current facility-administered medications for this visit.     REVIEW OF SYSTEMS:   Constitutional: Denies fevers, chills or abnormal night sweats Eyes: Denies blurriness of vision, double vision or watery eyes Ears, nose, mouth, throat, and face: Denies mucositis or sore throat Respiratory: Denies cough, dyspnea or wheezes Cardiovascular: Denies palpitation, chest discomfort or lower extremity swelling Gastrointestinal:  Denies nausea, heartburn or change in bowel habits Skin: Denies abnormal skin rashes Lymphatics: Denies new lymphadenopathy or easy bruising Neurological: Positive for numbness in the feet. Behavioral/Psych: Mood is stable, no new changes  All other systems were reviewed with the patient and are negative.  PHYSICAL EXAMINATION: ECOG PERFORMANCE STATUS: 1 - Symptomatic but completely ambulatory  Vitals:   07/23/19 1307  BP: (!) 143/79  Pulse: 88  Resp: 16  Temp: 97.7 F (36.5 C)  SpO2: 98%   Filed Weights   07/23/19 1307  Weight: 206 lb 4.8 oz (93.6 kg)    GENERAL:alert, no distress and comfortable SKIN:  skin color, texture, turgor are normal, no rashes or significant lesions EYES: normal, conjunctiva are pink and non-injected, sclera clear OROPHARYNX:no exudate, no erythema and lips, buccal mucosa, and tongue normal  NECK: supple, thyroid normal size, non-tender, without nodularity LYMPH:  no palpable lymphadenopathy in the cervical, axillary or inguinal LUNGS: clear to auscultation and percussion with normal breathing effort HEART: regular rate & rhythm and no murmurs.  1+ edema bilaterally. ABDOMEN:abdomen soft, non-tender and normal bowel sounds Musculoskeletal:no cyanosis of digits and no clubbing  PSYCH: alert & oriented x 3 with fluent speech NEURO: no focal motor/sensory deficits  LABORATORY DATA:  I have reviewed the data as listed Recent Results (from the past 2160 hour(s))  Comprehensive metabolic panel     Status: Abnormal   Collection Time: 04/29/19  3:44 PM  Result Value Ref Range   Sodium 131 (L) 135 - 145 mmol/L   Potassium 2.8 (L) 3.5 - 5.1 mmol/L   Chloride 98 98 - 111 mmol/L   CO2 22 22 - 32 mmol/L   Glucose, Bld 184 (H) 70 - 99 mg/dL   BUN 16 8 - 23 mg/dL   Creatinine, Ser 1.38 (H) 0.61 - 1.24 mg/dL   Calcium 9.4 8.9 - 10.3 mg/dL   Total Protein 7.4 6.5 - 8.1 g/dL   Albumin 4.0 3.5 - 5.0 g/dL   AST 26 15 - 41 U/L   ALT 24 0 - 44 U/L   Alkaline Phosphatase 70 38 - 126 U/L   Total Bilirubin 0.6 0.3 - 1.2 mg/dL   GFR calc non Af Amer 46 (L) >60 mL/min   GFR calc Af Amer 53 (L) >60 mL/min   Anion gap 11 5 - 15    Comment: Performed at [facility], [address], [place], [place] [address]  Lipase, blood     Status: None   Collection Time: 04/29/19  3:44 PM  Result Value Ref Range   Lipase 32 11 - 51 U/L    Comment: Performed at [facility], [address], [place], [place]  [address]  Troponin I (High Sensitivity)     Status: None   Collection Time: 04/29/19  3:44 PM  Result Value Ref Range   Troponin I (High Sensitivity) 9.00 <18 ng/L    Comment:  (NOTE) Elevated high sensitivity troponin I (hsTnI) values and significant  changes across serial measurements may suggest ACS but many other  chronic and acute conditions are known to elevate hsTnI results.  Refer to the "Links" section for chest pain algorithms and additional  guidance. Performed at [facility], [address] [place], [place] [address]   CBC with Differential     Status: Abnormal   Collection Time: 04/29/19  3:44 PM  Result Value Ref Range   WBC 5.0 4.0 - 10.5 K/uL   RBC 4.03 (L) 4.22 - 5.81 MIL/uL   Hemoglobin 11.1 (L) 13.0 - 17.0 g/dL   HCT 34.1 (L) 39.0 - 52.0 %   MCV 84.6 80.0 - 100.0 fL   MCH 27.5 26.0 - 34.0 pg   MCHC 32.6 30.0 - 36.0 g/dL   RDW 25.0 (H) 11.5 - 15.5 %   Platelets 248 150 - 400 K/uL   nRBC 0.0 0.0 - 0.2 %   Neutrophils Relative % 68 %   Neutro Abs 3.4 1.7 - 7.7 K/uL   Lymphocytes Relative 16 %   Lymphs Abs 0.8 0.7 - 4.0 K/uL   Monocytes Relative 14 %   Monocytes Absolute 0.7 0.1 - 1.0 K/uL   Eosinophils Relative 1 %   Eosinophils Absolute 0.0 0.0 - 0.5 K/uL   Basophils Relative 1 %   Basophils Absolute 0.0 0.0 - 0.1 K/uL   Immature Granulocytes 0 %   Abs Immature Granulocytes 0.02 0.00 - 0.07 K/uL    Comment: Performed at [facility], [address], [place], [place] [address]  Protime-INR     Status: Abnormal   Collection Time: 04/29/19  3:44 PM  Result Value Ref Range   Prothrombin Time 18.1 (H) 11.4 - 15.2 seconds   INR 1.5 (H) 0.8 - 1.2    Comment: (NOTE) INR goal varies based on device and disease states. Performed at [facility], [address] [place], [place] [address]   Magnesium     Status: None   Collection Time: 04/29/19  3:44 PM  Result Value Ref Range   Magnesium 1.7 1.7 - 2.4 mg/dL    Comment: Performed at [facility], [address], [place], [place] [address]  POC occult blood, ED Provider will collect     Status: None   Collection Time: 04/29/19  4:03 PM  Result Value Ref Range   Fecal Occult Bld NEGATIVE  NEGATIVE  SARS Coronavirus 2 (CEPHEID - Performed in [place] hospital lab), Hosp Order     Status: None   Collection Time: 04/29/19  5:51 PM   Specimen: Nasopharyngeal Swab  Result Value Ref Range   SARS Coronavirus 2 NEGATIVE NEGATIVE    Comment: (NOTE) If result is NEGATIVE SARS-CoV-2 target nucleic acids are NOT DETECTED. The SARS-CoV-2 RNA is generally detectable in upper and lower  respiratory specimens during the acute phase of infection. The lowest  concentration of SARS-CoV-2 viral copies this assay can detect is 250  copies / mL. A negative result does not preclude SARS-CoV-2 infection  and should not be used as the sole basis for treatment or other  patient management decisions.  A negative result may occur with  improper specimen collection / handling, submission of specimen other  than nasopharyngeal swab,  presence of viral mutation(s) within the  areas targeted by this assay, and inadequate number of viral copies  (<250 copies / mL). A negative result must be combined with clinical  observations, patient history, and epidemiological information. If result is POSITIVE SARS-CoV-2 target nucleic acids are DETECTED. The SARS-CoV-2 RNA is generally detectable in upper and lower  respiratory specimens dur ing the acute phase of infection.  Positive  results are indicative of active infection with SARS-CoV-2.  Clinical  correlation with patient history and other diagnostic information is  necessary to determine patient infection status.  Positive results do  not rule out bacterial infection or co-infection with other viruses. If result is PRESUMPTIVE POSTIVE SARS-CoV-2 nucleic acids MAY BE PRESENT.   A presumptive positive result was obtained on the submitted specimen  and confirmed on repeat testing.  While 2019 novel coronavirus  (SARS-CoV-2) nucleic acids may be present in the submitted sample  additional confirmatory testing may be necessary for epidemiological  and /  or clinical management purposes  to differentiate between  SARS-CoV-2 and other Sarbecovirus currently known to infect humans.  If clinically indicated additional testing with an alternate test  methodology [phone]) is advised. The SARS-CoV-2 RNA is generally  detectable in upper and lower respiratory sp ecimens during the acute  phase of infection. The expected result is Negative. Fact Sheet for Patients:  StrictlyIdeas.no Fact Sheet for Healthcare Providers: BankingDealers.co.za This test is not yet approved or cleared by the [place] FDA and has been authorized for detection and/or diagnosis of SARS-CoV-2 by FDA under an Emergency Use Authorization (EUA).  This EUA will remain in effect (meaning this test can be used) for the duration of the COVID-19 declaration under Section 564(b)(1) of the Act, 21 U.S.C. section 360bbb-3(b)(1), unless the authorization is terminated or revoked sooner. Performed at [facility], [address], [place], [place] [address]   Troponin I (High Sensitivity)     Status: None   Collection Time: 04/29/19  6:27 PM  Result Value Ref Range   Troponin I (High Sensitivity) 9.00 <18 ng/L    Comment: (NOTE) Elevated high sensitivity troponin I (hsTnI) values and significant  changes across serial measurements may suggest ACS but many other  chronic and acute conditions are known to elevate hsTnI results.  Refer to the "Links" section for chest pain algorithms and additional  guidance. Performed at [facility], [address], [place], [place] [address]   Glucose, capillary     Status: None   Collection Time: 04/29/19 10:03 PM  Result Value Ref Range   Glucose-Capillary 89 70 - 99 mg/dL   Comment 1 Notify RN    Comment 2 Document in Chart   Hemoglobin and hematocrit, blood     Status: Abnormal   Collection Time: 04/29/19 10:24 PM  Result Value Ref Range   Hemoglobin 10.8 (L) 13.0 - 17.0 g/dL   HCT 33.3  (L) 39.0 - 52.0 %    Comment: Performed at [facility], [address], [place], [place] [address]  Glucose, capillary     Status: Abnormal   Collection Time: 04/30/19 12:26 AM  Result Value Ref Range   Glucose-Capillary 131 (H) 70 - 99 mg/dL   Comment 1 Notify RN    Comment 2 Document in Chart   Glucose, capillary     Status: Abnormal   Collection Time: 04/30/19  4:08 AM  Result Value Ref Range   Glucose-Capillary 111 (H) 70 - 99 mg/dL   Comment 1 Notify  RN    Comment 2 Document in Chart   Basic metabolic panel     Status: Abnormal   Collection Time: 04/30/19  5:36 AM  Result Value Ref Range   Sodium 135 135 - 145 mmol/L   Potassium 3.8 3.5 - 5.1 mmol/L    Comment: DELTA CHECK NOTED   Chloride 101 98 - 111 mmol/L   CO2 23 22 - 32 mmol/L   Glucose, Bld 108 (H) 70 - 99 mg/dL   BUN 13 8 - 23 mg/dL   Creatinine, Ser 1.17 0.61 - 1.24 mg/dL   Calcium 9.3 8.9 - 10.3 mg/dL   GFR calc non Af Amer 56 (L) >60 mL/min   GFR calc Af Amer >60 >60 mL/min   Anion gap 11 5 - 15    Comment: Performed at [facility], [address], [place], [place] [address]  CBC     Status: Abnormal   Collection Time: 04/30/19  5:36 AM  Result Value Ref Range   WBC 4.9 4.0 - 10.5 K/uL   RBC 3.91 (L) 4.22 - 5.81 MIL/uL   Hemoglobin 10.9 (L) 13.0 - 17.0 g/dL   HCT 33.5 (L) 39.0 - 52.0 %   MCV 85.7 80.0 - 100.0 fL   MCH 27.9 26.0 - 34.0 pg   MCHC 32.5 30.0 - 36.0 g/dL   RDW 25.8 (H) 11.5 - 15.5 %   Platelets 222 150 - 400 K/uL   nRBC 0.0 0.0 - 0.2 %    Comment: Performed at [facility], [address], [place], [place] [address]  Glucose, capillary     Status: Abnormal   Collection Time: 04/30/19  7:30 AM  Result Value Ref Range   Glucose-Capillary 113 (H) 70 - 99 mg/dL  Glucose, capillary     Status: Abnormal   Collection Time: 04/30/19 11:20 AM  Result Value Ref Range   Glucose-Capillary 101 (H) 70 - 99 mg/dL  ECHOCARDIOGRAM COMPLETE     Status: None   Collection Time: 04/30/19  3:23 PM  Result  Value Ref Range   Weight 3,360 oz   Height 70 in   BP 132/73 mmHg  Glucose, capillary     Status: Abnormal   Collection Time: 04/30/19  4:19 PM  Result Value Ref Range   Glucose-Capillary 204 (H) 70 - 99 mg/dL    RADIOGRAPHIC STUDIES: I have personally reviewed the radiological images as listed and agreed with the findings in the report.  ASSESSMENT & PLAN:  Normocytic anemia 1.  Normocytic anemia: -EGD on 03/09/2019 shows 1 of 4 small angiectasias with bleeding in the cardia and in the gastric body, APC was done. -Repeat EGD on 03/21/2019 shows normal esophagus, nonbleeding superficial gastric ulcer at fundus resulting from [facility] ablation of AVM.  He reportedly underwent capsule study also. - Last CBC on 04/30/2019 shows hemoglobin 10.9 with MCV of 85.7.  CBC on 03/22/2019 shows hemoglobin 8.1 with MCV of 82.6. - He has been taking iron tablets 3 times a day since June 2020.  He reports dark stools. -He is also taking B12 tablet daily at a dose of 1 mg. -No prior history of blood transfusions.  Xarelto and Plavix have been discontinued due to anemia. - He received Feraheme on 04/19/2019 and 04/26/2019.  He tolerated them very well. -We will check his CBC and rule out nutritional deficiencies by checking B12, methylmalonic acid, folic acid, copper levels.  We will also check his ferritin and iron panel.  We will send serum  protein electrophoresis.  We will also check LDH and reticulocyte count.  We will check his stool for occult blood. - Based on his ferritin and iron panel, will decide if he needs further iron infusions.  2.  Colon cancer: - He had colon cancer resected 20-25 years ago.  Did not receive any adjuvant chemotherapy. - Patient had a brother who died of colon cancer.     All questions were answered. The patient knows to call the clinic with any problems, questions or concerns.     Derek Jack, MD 07/23/19 6:31 PM

## 2019-07-24 ENCOUNTER — Inpatient Hospital Stay (HOSPITAL_COMMUNITY): Payer: Medicare PPO | Attending: Hematology

## 2019-07-24 ENCOUNTER — Ambulatory Visit (HOSPITAL_COMMUNITY)
Admission: RE | Admit: 2019-07-24 | Discharge: 2019-07-24 | Disposition: A | Discharge status: Home / Self Care | Payer: Medicare PPO | Source: Ambulatory Visit | Attending: Gastroenterology | Admitting: Gastroenterology

## 2019-07-24 ENCOUNTER — Other Ambulatory Visit: Payer: Self-pay

## 2019-07-24 DIAGNOSIS — D649 Anemia, unspecified: Secondary | ICD-10-CM | POA: Insufficient documentation

## 2019-07-24 DIAGNOSIS — K6389 Other specified diseases of intestine: Secondary | ICD-10-CM | POA: Insufficient documentation

## 2019-07-24 DIAGNOSIS — K7460 Unspecified cirrhosis of liver: Secondary | ICD-10-CM | POA: Insufficient documentation

## 2019-07-24 LAB — CBC WITH DIFFERENTIAL/PLATELET
Abs Immature Granulocytes: 0.01 10*3/uL (ref 0.00–0.07)
Basophils Absolute: 0 10*3/uL (ref 0.0–0.1)
Basophils Relative: 1 %
Eosinophils Absolute: 0.1 10*3/uL (ref 0.0–0.5)
Eosinophils Relative: 3 %
HCT: 36.4 % — ABNORMAL LOW (ref 39.0–52.0)
Hemoglobin: 12.4 g/dL — ABNORMAL LOW (ref 13.0–17.0)
Immature Granulocytes: 0 %
Lymphocytes Relative: 33 %
Lymphs Abs: 1.1 10*3/uL (ref 0.7–4.0)
MCH: 33.3 pg (ref 26.0–34.0)
MCHC: 34.1 g/dL (ref 30.0–36.0)
MCV: 97.8 fL (ref 80.0–100.0)
Monocytes Absolute: 0.5 10*3/uL (ref 0.1–1.0)
Monocytes Relative: 14 %
Neutro Abs: 1.6 10*3/uL — ABNORMAL LOW (ref 1.7–7.7)
Neutrophils Relative %: 49 %
Platelets: 194 10*3/uL (ref 150–400)
RBC: 3.72 MIL/uL — ABNORMAL LOW (ref 4.22–5.81)
RDW: 13.9 % (ref 11.5–15.5)
WBC: 3.2 10*3/uL — ABNORMAL LOW (ref 4.0–10.5)
nRBC: 0 % (ref 0.0–0.2)

## 2019-07-24 LAB — POCT I-STAT CREATININE: Creatinine, Ser: 1.2 mg/dL (ref 0.61–1.24)

## 2019-07-24 LAB — COMPREHENSIVE METABOLIC PANEL
ALT: 24 U/L (ref 0–44)
AST: 30 U/L (ref 15–41)
Albumin: 4 g/dL (ref 3.5–5.0)
Alkaline Phosphatase: 58 U/L (ref 38–126)
Anion gap: 9 (ref 5–15)
BUN: 17 mg/dL (ref 8–23)
CO2: 25 mmol/L (ref 22–32)
Calcium: 9.3 mg/dL (ref 8.9–10.3)
Chloride: 97 mmol/L — ABNORMAL LOW (ref 98–111)
Creatinine, Ser: 1.19 mg/dL (ref 0.61–1.24)
GFR calc Af Amer: >60 mL/min (ref >60)
GFR calc non Af Amer: 55 mL/min — ABNORMAL LOW (ref >60)
Glucose, Bld: 105 mg/dL — ABNORMAL HIGH (ref 70–99)
Potassium: 3.7 mmol/L (ref 3.5–5.1)
Sodium: 131 mmol/L — ABNORMAL LOW (ref 135–145)
Total Bilirubin: 0.8 mg/dL (ref 0.3–1.2)
Total Protein: 7.1 g/dL (ref 6.5–8.1)

## 2019-07-24 LAB — LACTATE DEHYDROGENASE: LDH: 168 U/L (ref 98–192)

## 2019-07-24 LAB — RETICULOCYTES
Immature Retic Fract: 7 % (ref 2.3–15.9)
RBC.: 3.72 MIL/uL — ABNORMAL LOW (ref 4.22–5.81)
Retic Count, Absolute: 74.4 10*3/uL (ref 19.0–186.0)
Retic Ct Pct: 2 % (ref 0.4–3.1)

## 2019-07-24 LAB — VITAMIN B12: Vitamin B-12: 693 pg/mL (ref 180–914)

## 2019-07-24 LAB — IRON AND TIBC
Iron: 112 ug/dL (ref 45–182)
Saturation Ratios: 39 % (ref 17.9–39.5)
TIBC: 290 ug/dL (ref 250–450)
UIBC: 178 ug/dL

## 2019-07-24 LAB — FOLATE: Folate: 9.9 ng/mL (ref >5.9)

## 2019-07-24 LAB — FERRITIN: Ferritin: 175 ng/mL (ref 24–336)

## 2019-07-24 MED ORDER — IOHEXOL 300 MG/ML  SOLN
150.0000 mL | Freq: Once | INTRAMUSCULAR | Status: AC | PRN
  Administered 2019-07-24: 125 mL via INTRAVENOUS

## 2019-07-24 NOTE — Progress Notes (Signed)
Labs reviewed. See other phone note.

## 2019-07-24 NOTE — Telephone Encounter (Signed)
Elmo Putt, can you let patient know I have received his labs completed on 07/11/19? CBC with hemoglobin normal 13.0. WBC low at 2.8 which is nonspecific. Platelets normal 185. AFP within normal limits. Hep A Ab negative, Hep B core Ab, surface Ab, and surface Ag negative, Hep C Ab negative. Patient will need Hep A and B vaccinations. We can provide prescription at next visit unless patient would like to come by office to pick up prescription.    Regarding Protonix, if he is not taking this and doesn't have a prescription, I will send one in. He should be taking Protonix 40 mg BID.

## 2019-07-25 LAB — PROTEIN ELECTROPHORESIS, SERUM
A/G Ratio: 1.2 (ref 0.7–1.7)
Albumin ELP: 3.7 g/dL (ref 2.9–4.4)
Alpha-1-Globulin: 0.2 g/dL (ref 0.0–0.4)
Alpha-2-Globulin: 0.7 g/dL (ref 0.4–1.0)
Beta Globulin: 0.9 g/dL (ref 0.7–1.3)
Gamma Globulin: 1.1 g/dL (ref 0.4–1.8)
Globulin, Total: 3 g/dL (ref 2.2–3.9)
Total Protein ELP: 6.7 g/dL (ref 6.0–8.5)

## 2019-07-25 NOTE — Telephone Encounter (Signed)
Lmom, waiting on a return call.  

## 2019-07-26 LAB — COPPER, SERUM: Copper: 116 ug/dL (ref 72–166)

## 2019-07-31 ENCOUNTER — Telehealth: Payer: Self-pay | Admitting: Cardiovascular Disease

## 2019-07-31 NOTE — Telephone Encounter (Signed)

## 2019-08-01 ENCOUNTER — Other Ambulatory Visit: Payer: Self-pay

## 2019-08-01 ENCOUNTER — Ambulatory Visit: Payer: Medicare PPO | Admitting: Cardiovascular Disease

## 2019-08-01 ENCOUNTER — Encounter: Payer: Self-pay | Admitting: Cardiovascular Disease

## 2019-08-01 VITALS — BP 127/78 | HR 78 | Ht 70.0 in | Wt 205.4 lb

## 2019-08-01 DIAGNOSIS — I4819 Other persistent atrial fibrillation: Secondary | ICD-10-CM

## 2019-08-01 DIAGNOSIS — I10 Essential (primary) hypertension: Secondary | ICD-10-CM

## 2019-08-01 DIAGNOSIS — I25118 Atherosclerotic heart disease of native coronary artery with other forms of angina pectoris: Secondary | ICD-10-CM | POA: Diagnosis not present

## 2019-08-01 DIAGNOSIS — D649 Anemia, unspecified: Secondary | ICD-10-CM

## 2019-08-01 DIAGNOSIS — E785 Hyperlipidemia, unspecified: Secondary | ICD-10-CM

## 2019-08-01 NOTE — Patient Instructions (Signed)
Your physician wants you to follow-up in: [place] will receive a reminder letter in the mail two months in advance. If you don't receive a letter, please call our office to schedule the follow-up appointment.  Your physician recommends that you continue on your current medications as directed. Please refer to the Current Medication list given to you today.  Thank you for choosing [facility]!!

## 2019-08-01 NOTE — Progress Notes (Signed)
SUBJECTIVE: The patient presents for routine follow-up.  He was hospitalized for chest pain in July 2020.  He was hypokalemic.  Hemoglobin was stable at 10.8 at that time.  He had episodes of rapid atrial fibrillation and short runs of nonsustained ventricular tachycardia in the setting.  He has a history of normocytic anemia and follows with hematology.  He has received Feraheme in the past.  The patient denies any symptoms of chest pain, palpitations, shortness of breath, lightheadedness, dizziness, leg swelling, orthopnea, PND, and syncope.  He told me he has not had black stools for the past 3 days and he was encouraged by this.  He has a good appetite.  He turns 89 on October 20!  His son from [place] and his daughter from [place] are driving down to visit him.  Social history: He has a son and Jewett who is a Retail buyer.  He spent 21 years in prison but has turned his life around.  His daughter is a retired Nature conservation officer in [place].  He and his wife have been married for 60 years on 07/04/2019.   Review of Systems: As per "subjective", otherwise negative.  Allergies  Allergen Reactions  . Morphine     Other reaction(s): Angioedema    Current Outpatient Medications  Medication Sig Dispense Refill  . albuterol (PROVENTIL HFA;VENTOLIN HFA) 108 (90 Base) MCG/ACT inhaler Inhale 1 puff into the lungs every 6 (six) hours as needed for wheezing or shortness of breath. 1 Inhaler 2  . atorvastatin (LIPITOR) 40 MG tablet TAKE 1 AND 1/2 TABLETS EVERY DAY (Patient taking differently: Take 60 mg by mouth daily. ) 135 tablet 2  . donepezil (ARICEPT) 5 MG tablet Take 5 mg by mouth at bedtime.    . ferrous sulfate 325 (65 FE) MG EC tablet Take 325 mg by mouth 3 (three) times daily with meals. Pt said he only takes it twice a day    . hydrALAZINE (APRESOLINE) 100 MG tablet TAKE 1 TABLET THREE TIMES DAILY ( DOSE INCREASE ) 270 tablet 1  . hydrochlorothiazide  (HYDRODIURIL) 12.5 MG tablet Take 12.5 mg by mouth daily with breakfast.     . isosorbide mononitrate (IMDUR) 60 MG 24 hr tablet Take 60 mg by mouth daily.    Marland Kitchen losartan (COZAAR) 100 MG tablet Take 100 mg by mouth daily.    . nitroGLYCERIN (NITROSTAT) 0.4 MG SL tablet DISSOLVE 1 TABLET UNDER THE TONGUE EVERY 5 MINUTES AS NEEDED FOR CHEST PAIN 30 tablet 3  . vitamin B-12 (CYANOCOBALAMIN) 1000 MCG tablet Take 1,000 mcg by mouth every Sunday.     No current facility-administered medications for this visit.     Past Medical History:  Diagnosis Date  . Anginal pain ([place])   . Atrial fibrillation ([place])   . CAD (coronary artery disease)   . Cancer of sigmoid colon ([place])    approx 1990  . DM type 2 (diabetes mellitus, type 2) ([place])   . GERD (gastroesophageal reflux disease)   . Gout   . Hiatal hernia   . Hyperlipemia   . Hypertension   . Incontinence     Past Surgical History:  Procedure Laterality Date  . BIOPSY  03/09/2019   Procedure: BIOPSY;  Surgeon: Danie Binder, MD;  [facility];  Service: Endoscopy;;  . cataracts    . CHOLECYSTECTOMY    . COLONOSCOPY     STOPPED BREATHING  . CORONARY STENT INTERVENTION  12/06/2018  .  CORONARY STENT INTERVENTION N/A 12/06/2018   Procedure: CORONARY STENT INTERVENTION;  Surgeon: Jettie Booze, MD;  Location: [facility];  Service: Cardiovascular;  Laterality: N/A;  . 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   . ESOPHAGOGASTRODUODENOSCOPY N/A 03/21/2019   non-bleeding ulcers resulting from APC ablation of AVMs  . GIVENS CAPSULE STUDY N/A 03/22/2019    not complete to the cecum. However, a polypoid-type lesion with vascular component noted in small bowel, with plans for CTE for further evaluation. Query blue rubber bleb nevus  . MINOR CARPAL TUNNEL    . REPLACEMENT TOTAL KNEE    . RIGHT/LEFT HEART CATH AND CORONARY ANGIOGRAPHY N/A 12/06/2018   Procedure: RIGHT/LEFT  HEART CATH AND CORONARY ANGIOGRAPHY;  Surgeon: Jettie Booze, MD;  Location: [facility];  Service: Cardiovascular;  Laterality: N/A;    Social History   Socioeconomic History  . Marital status: Married    Spouse name: Not on file  . Number of children: 5  . Years of education: Not on file  . Highest education level: Not on file  Occupational History  . Occupation: retired  Scientific laboratory technician  . Financial resource strain: Not hard at all  . Food insecurity    Worry: Patient refused    Inability: Patient refused  . Transportation needs    Medical: Patient refused    Non-medical: Patient refused  Tobacco Use  . Smoking status: Current Some Day Smoker    Packs/day: 1.00    Years: 20.00    Pack years: 20.00    Types: Cigars    Start date: 07/18/1993    Last attempt to quit: 10/02/2017    Years since quitting: 1.8  . Smokeless tobacco: Never Used  Substance and Sexual Activity  . Alcohol use: Yes    Alcohol/week: 0.0 standard drinks    Comment: Socially  . Drug use: Never  . Sexual activity: Not on file  Lifestyle  . Physical activity    Days per week: 0 days    Minutes per session: 0 min  . Stress: Not at all  Relationships  . Social connections    Talks on phone: More than three times a week    Gets together: Twice a week    Attends religious service: More than 4 times per year    Active member of club or organization: No    Attends meetings of clubs or organizations: Never    Relationship status: Married  . Intimate partner violence    Fear of current or ex partner: Patient refused    Emotionally abused: Patient refused    Physically abused: Patient refused    Forced sexual activity: Patient refused  Other Topics Concern  . 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 [place] SEP 2020: 4 BIO, 1 STEP.     Vitals:   08/01/19 0952  BP: 127/78  Pulse: 78  SpO2: 100%  Weight: 205 lb 6.4  oz (93.2 kg)  Height: 5\' 10"  (1.778 m)    Wt Readings from Last 3 Encounters:  08/01/19 205 lb 6.4 oz (93.2 kg)  07/23/19 206 lb 4.8 oz (93.6 kg)  07/09/19 202 lb 6.4 oz (91.8 kg)     PHYSICAL EXAM General: NAD HEENT: Normal. Neck: No JVD, no thyromegaly. Lungs: Clear to auscultation bilaterally with normal respiratory effort. CV: Regular rate and  irregular rhythm, normal S1/S2, no S3, no murmur. No pretibial or periankle edema.    Abdomen: Soft, nontender, no distention.  Neurologic: Alert and oriented.  Psych: Normal affect. Skin: Normal. Musculoskeletal: No gross deformities.      Labs: Lab Results  Component Value Date/Time   K 3.7 07/24/2019 01:22 PM   BUN 17 07/24/2019 01:22 PM   CREATININE 1.19 07/24/2019 01:22 PM   ALT 24 07/24/2019 01:22 PM   HGB 12.4 (L) 07/24/2019 01:22 PM     Lipids: No results found for: LDLCALC, LDLDIRECT, CHOL, TRIG, HDL     ASSESSMENT AND PLAN: 1.  Coronary artery disease: Symptomatically stable. He underwent DES to the ostial to proximal left circumflex and balloon angioplasty to the OM1 (residual 30% stenosis) on 12/06/18. Continue atorvastatin and Imdur.  He is no longer on antiplatelet therapy due to issues with GI bleeding and anemia.  2.  Persistent atrial fibrillation: Symptomatically stable. Not on beta blocker therapy due to bradycardia while hospitalized at [facility] in 12/2018.  No longer on systemic anticoagulation due to history of GI bleeding and anemia.  3.    Normocytic anemia: He follows with hematology and has received Feraheme in the past.  Hemoglobin 12.4 on 07/24/2019.  4.  Hyperlipidemia: Continue atorvastatin.  5.  Hypertension: BP is normal.  No changes to therapy.    Disposition: Follow up 6 months   Kate Sable, M.D., F.A.C.C.

## 2019-08-02 ENCOUNTER — Other Ambulatory Visit: Payer: Self-pay | Admitting: Gastroenterology

## 2019-08-02 DIAGNOSIS — K921 Melena: Secondary | ICD-10-CM

## 2019-08-02 DIAGNOSIS — Z8711 Personal history of peptic ulcer disease: Secondary | ICD-10-CM

## 2019-08-02 DIAGNOSIS — Z8719 Personal history of other diseases of the digestive system: Secondary | ICD-10-CM

## 2019-08-02 MED ORDER — PANTOPRAZOLE SODIUM 40 MG PO TBEC: 40.0000 mg | DELAYED_RELEASE_TABLET | Freq: Two times a day (BID) | ORAL | 5 refills | Status: DC

## 2019-08-02 NOTE — Telephone Encounter (Signed)
I am sending in Protonix 40 mg BID. He needs to take this 30 minutes before breakfast and 30 minutes before dinner.

## 2019-08-02 NOTE — Telephone Encounter (Signed)
Spoke with pt. Pt notified of lab results. Pt is starting to have some Gerd symptoms and would like Protonix sent into his pharmacy [facility]. Pt currently doesn't have an RX. HEP A & and B order mailed to pt.

## 2019-08-02 NOTE — Progress Notes (Signed)
No obvious small bowel mass. Let's follow clinically. Monitor for black stools.

## 2019-08-02 NOTE — Telephone Encounter (Signed)
Noted. Pt is aware instructions will be on prescription for him.

## 2019-08-03 LAB — METHYLMALONIC ACID, SERUM: Methylmalonic Acid, Quantitative: 261 nmol/L (ref 0–378)

## 2019-08-15 ENCOUNTER — Ambulatory Visit (HOSPITAL_COMMUNITY): Payer: Medicare PPO | Admitting: Hematology

## 2019-09-04 ENCOUNTER — Ambulatory Visit (HOSPITAL_COMMUNITY): Payer: Medicare PPO | Admitting: Hematology

## 2019-09-27 ENCOUNTER — Other Ambulatory Visit: Payer: Self-pay | Admitting: Cardiovascular Disease

## 2019-10-01 ENCOUNTER — Other Ambulatory Visit: Payer: Self-pay

## 2019-10-01 DIAGNOSIS — K7460 Unspecified cirrhosis of liver: Secondary | ICD-10-CM

## 2019-11-26 ENCOUNTER — Encounter: Payer: Self-pay | Admitting: Gastroenterology

## 2019-12-05 ENCOUNTER — Other Ambulatory Visit: Payer: Self-pay | Admitting: Cardiovascular Disease

## 2019-12-26 ENCOUNTER — Other Ambulatory Visit: Payer: Self-pay | Admitting: Cardiovascular Disease

## 2020-01-16 IMAGING — DX ABDOMEN - 1 VIEW
2 series · 2 of 2 positions shown · non-contrast
Comparison: None.

CLINICAL DATA: Evaluate for capsule

EXAM:
ABDOMEN - 1 VIEW

[abdomen kub (1 of 2)]
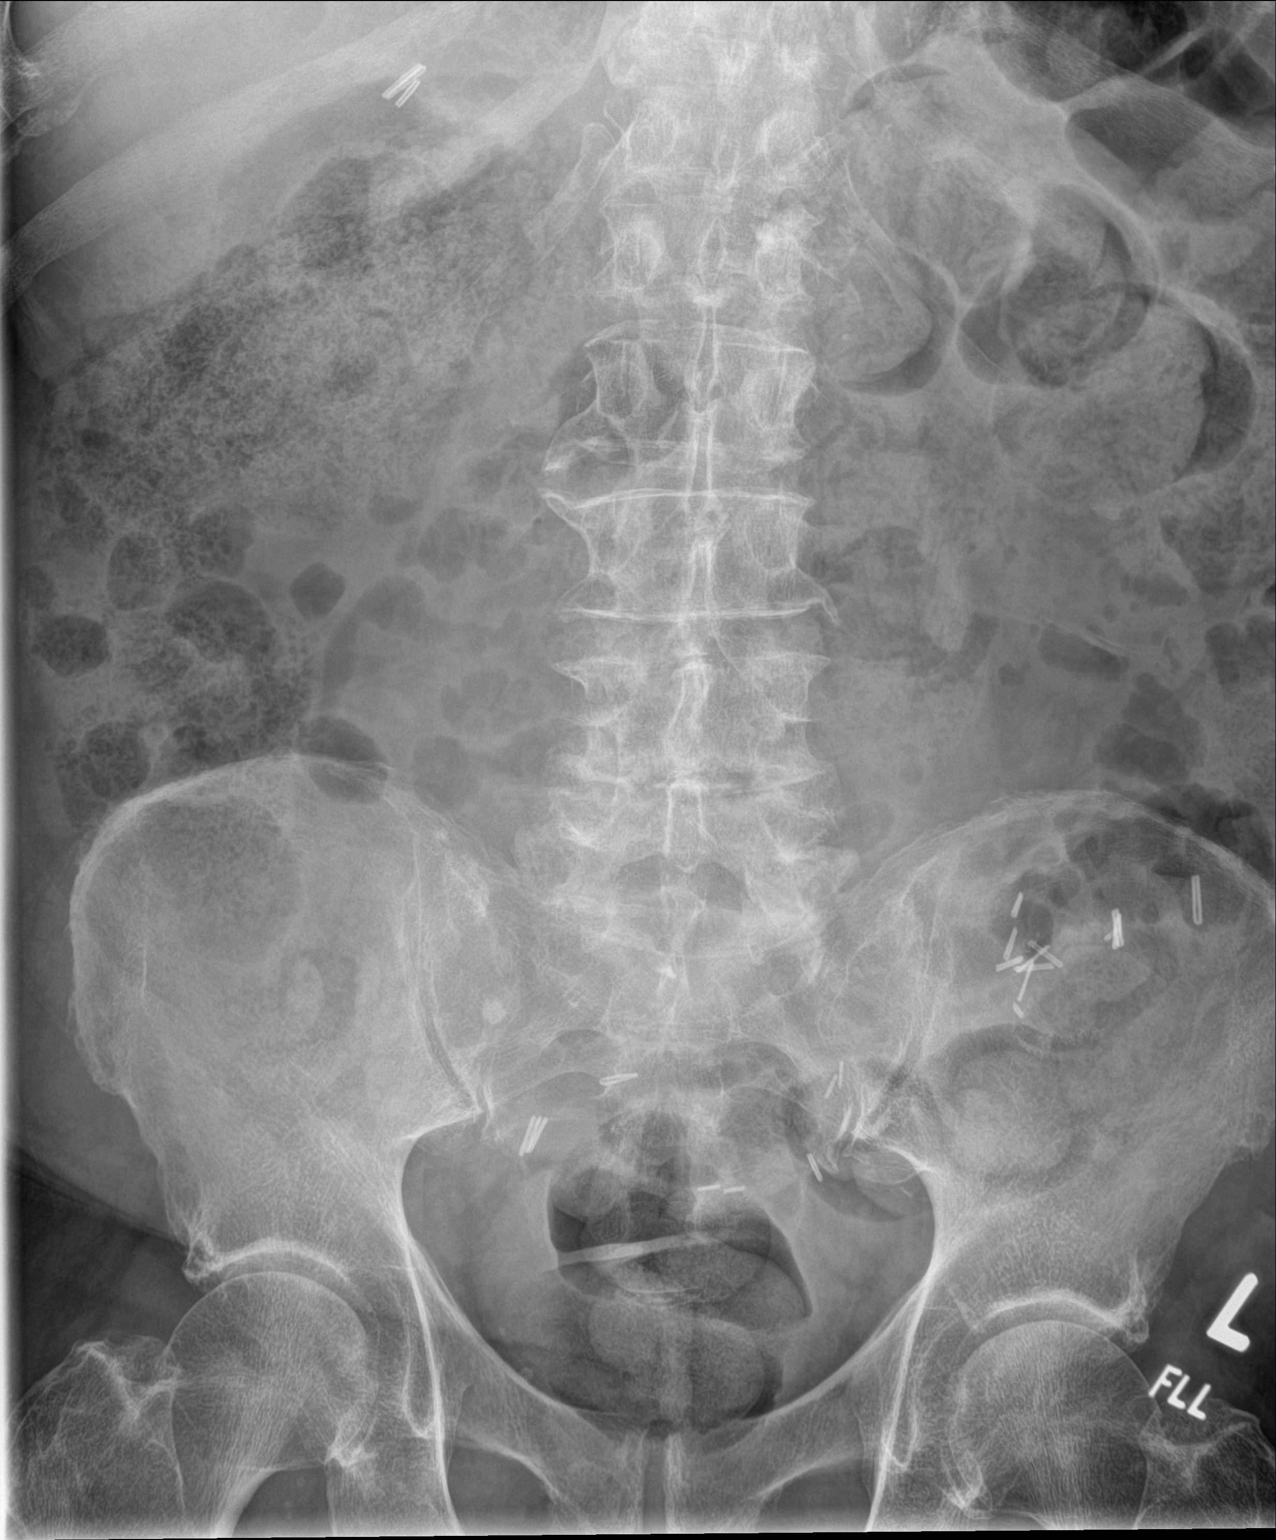

[abdomen kub (2 of 2)]
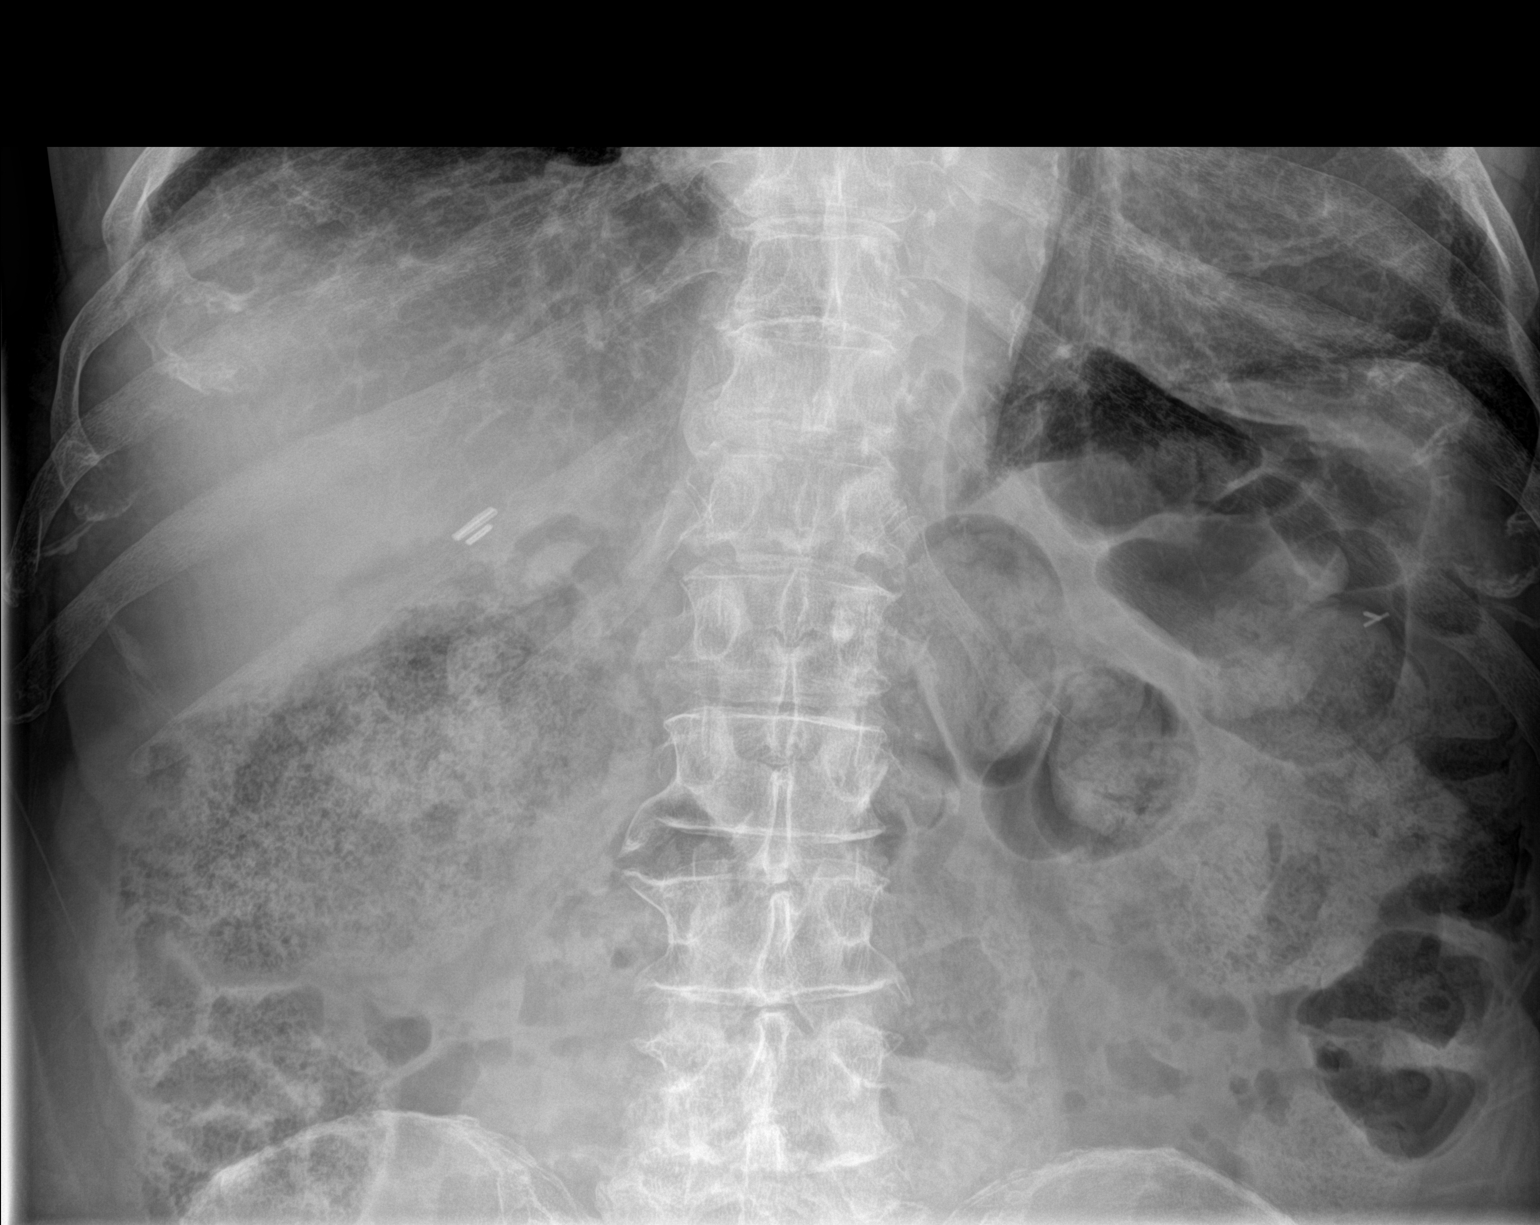

[2 of 2 positions shown; findings below may reference images not displayed]

FINDINGS: Surgical clips in the left lower quadrant and pelvis. Prior
cholecystectomy clips in the right upper quadrant. Large stool
burden throughout the colon. No bowel obstruction, free air or
organomegaly. No endoscopy capsule or other foreign body.
IMPRESSION: No capsule visualized.

Large stool burden throughout the colon.

No acute findings.

## 2020-01-25 ENCOUNTER — Other Ambulatory Visit: Payer: Self-pay | Admitting: Cardiovascular Disease

## 2020-01-25 ENCOUNTER — Other Ambulatory Visit: Payer: Self-pay | Admitting: Cardiology

## 2020-01-26 IMAGING — CT CT ABDOMEN AND PELVIS WITH CONTRAST
2 of 5 series · 16 of 46 positions shown, 18 images · IV contrast (Isovue)
Comparison: None.

CLINICAL DATA: Acute generalized abdominal pain beginning today.
[place].

EXAM:
CT ABDOMEN AND PELVIS WITH CONTRAST
TECHNIQUE: Multidetector CT imaging of the abdomen and pelvis was performed
using the standard protocol following bolus administration of
intravenous contrast.
CONTRAST:  80mL OMNIPAQUE IOHEXOL 300 MG/ML  SOLN

[Series 2: axial st · axial · 0.72mm/px · z∈[+589,+954]mm · 13 of 85 slices shown, 15 images]
[im 6/85  soft-tissue]
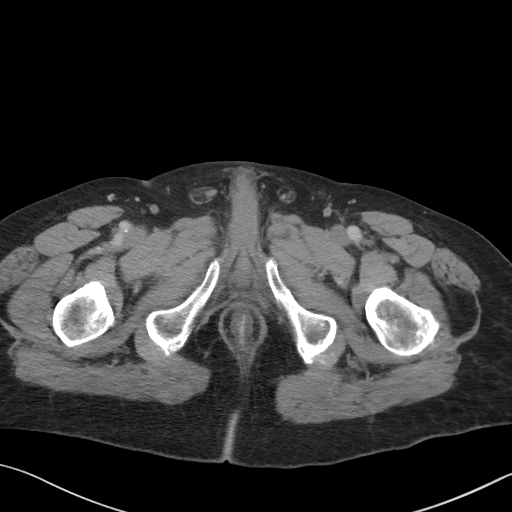
[im 6/85  bone]
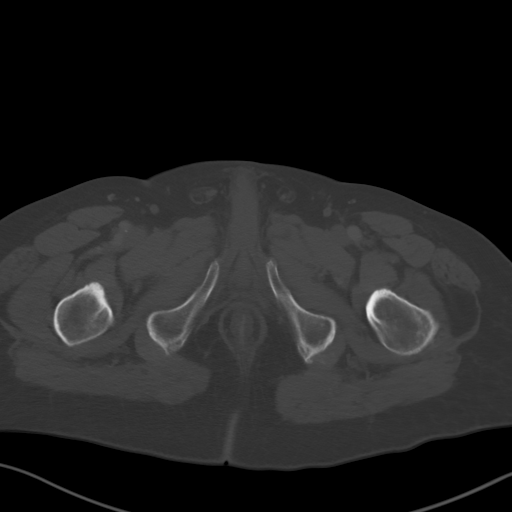
[im 11/85  soft-tissue]
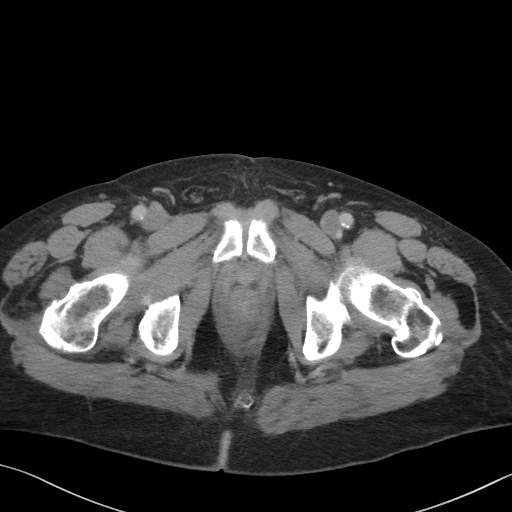
[im 16/85  soft-tissue]
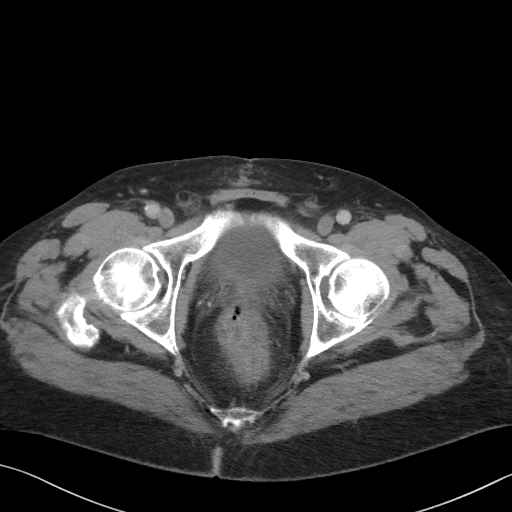
[im 27/85  soft-tissue]
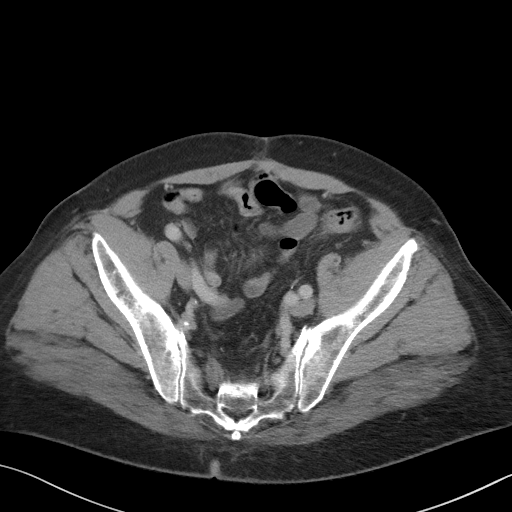
[im 32/85  soft-tissue]
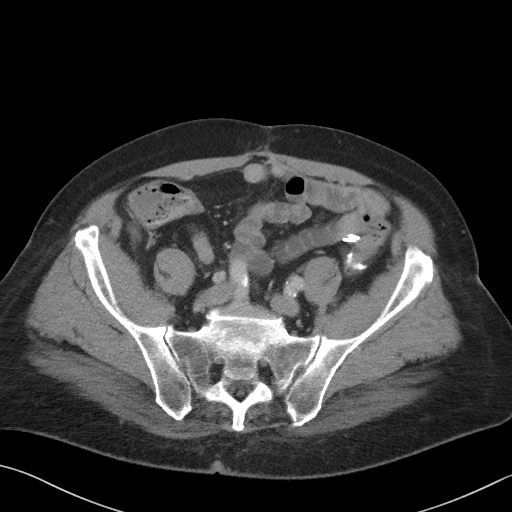
[im 37/85  soft-tissue]
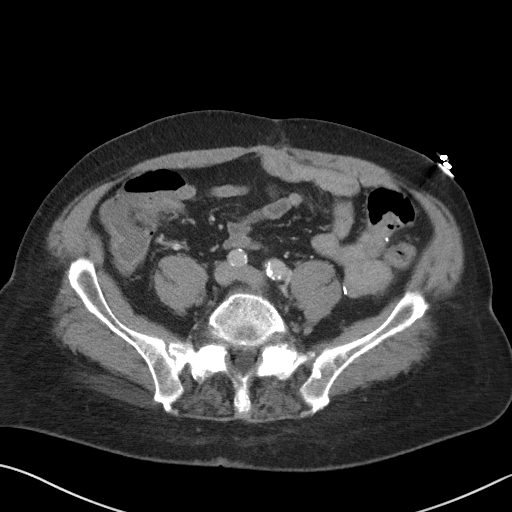
[im 43/85  soft-tissue]
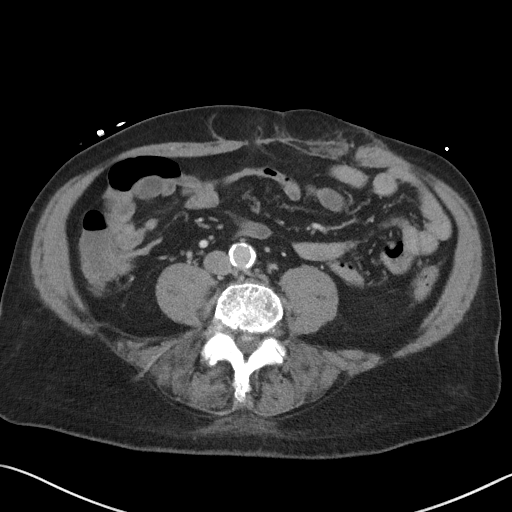
[im 48/85  soft-tissue]
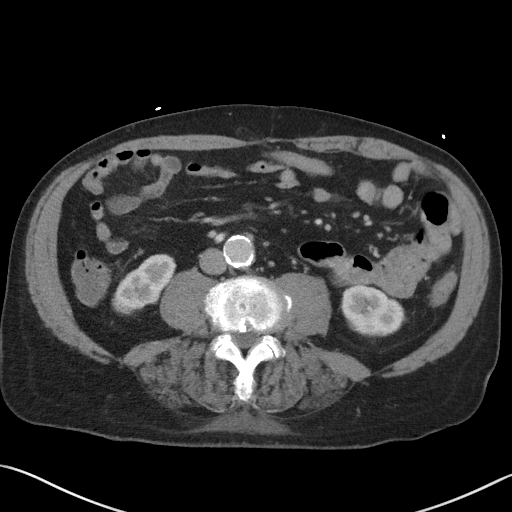
[im 53/85  soft-tissue]
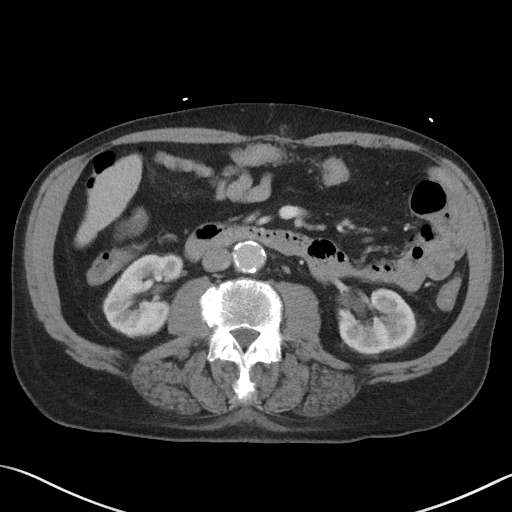
[im 53/85  bone]
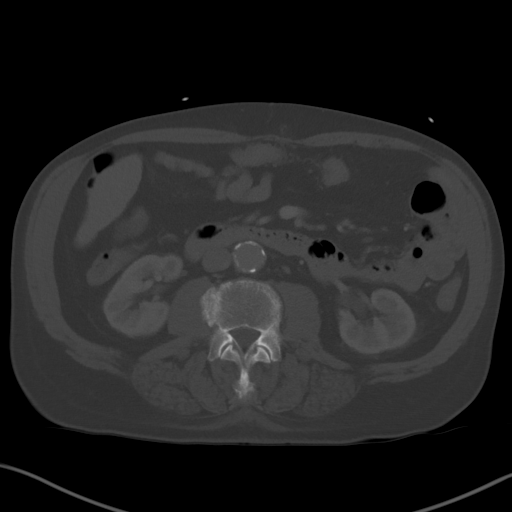
[im 58/85  soft-tissue]
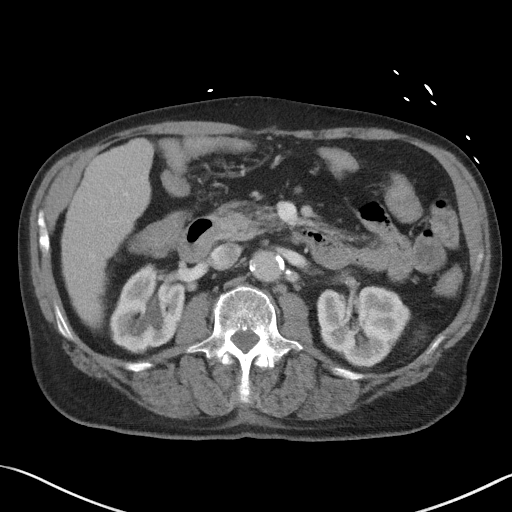
[im 69/85  soft-tissue]
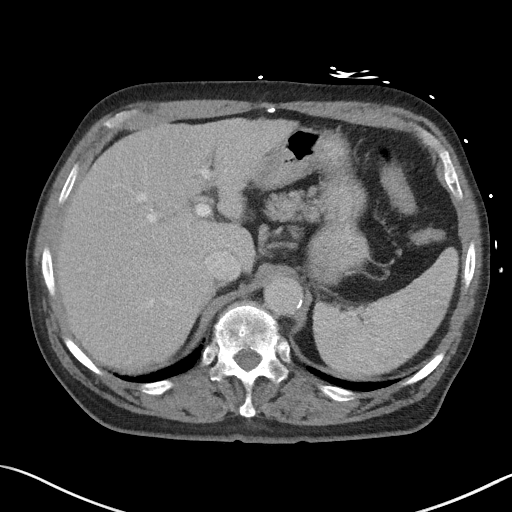
[im 74/85  soft-tissue]
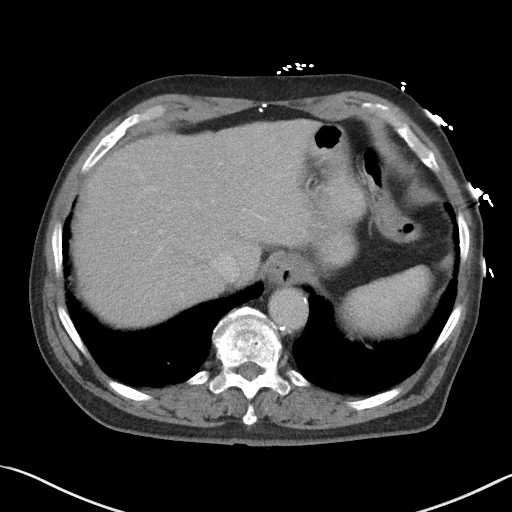
[im 79/85  soft-tissue]
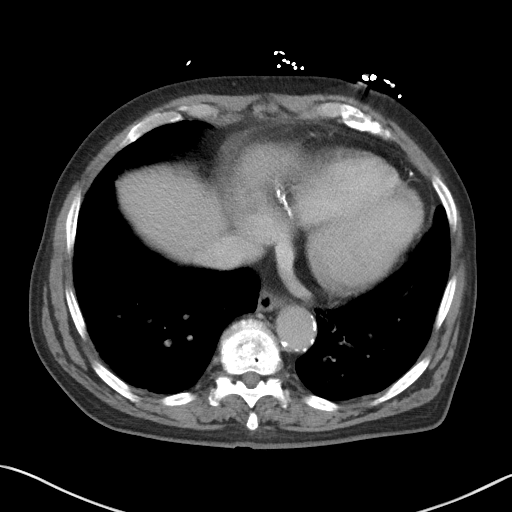

[Series 5: coronal st · coronal · 0.74mm/px · 3 of 88 slices shown]
[im 30/88  soft-tissue]
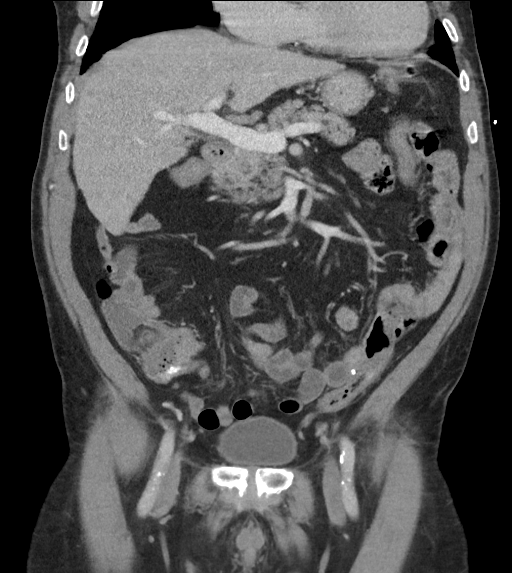
[im 39/88  soft-tissue]
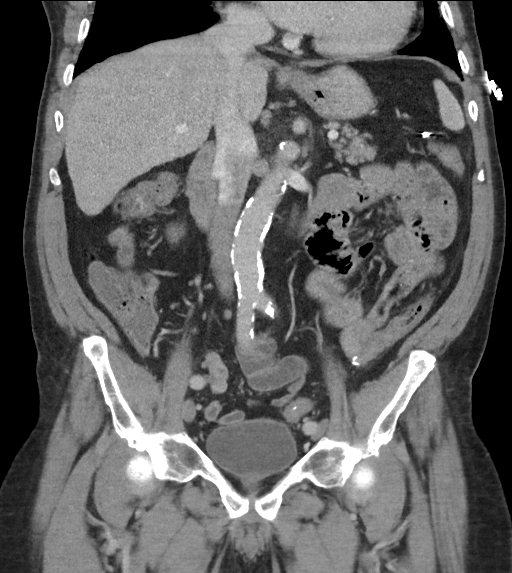
[im 49/88  soft-tissue]
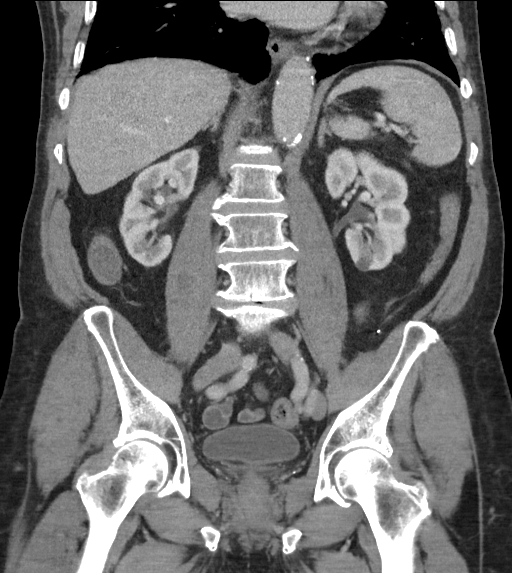

[16 of 46 positions shown; findings below may reference images not displayed]

FINDINGS: Lower Chest: No acute findings. Bibasilar scarring.

Hepatobiliary: Mild caudate hypertrophy and diffuse capsular
nodularity, consistent with hepatic cirrhosis. No hepatic masses
identified. No evidence of ascites. Prior cholecystectomy. No
evidence of biliary obstruction.

Pancreas:  No mass or inflammatory changes.

Spleen: Within normal limits in size and appearance.

Adrenals/Urinary Tract: No masses identified. Tiny cyst noted in
lower pole of left kidney. No evidence of hydronephrosis.

Stomach/Bowel: No evidence of obstruction, inflammatory process or
abnormal fluid collections. Normal appendix visualized. A few
diverticula are seen in the proximal sigmoid colon in the area of
surgical staples, however there is no evidence of diverticulitis.

Vascular/Lymphatic: No pathologically enlarged lymph nodes. No
abdominal aortic aneurysm. Aortic atherosclerosis.

Reproductive:  No mass or other significant abnormality.

Other:  None.

Musculoskeletal:  No suspicious bone lesions identified.
IMPRESSION: 1. Colonic diverticulosis, without radiographic evidence of
diverticulitis or other acute findings.
2. Hepatic cirrhosis. No evidence of hepatic neoplasm or ascites.

Aortic Atherosclerosis ([id number]).

## 2020-02-06 ENCOUNTER — Telehealth: Payer: Self-pay | Admitting: Cardiovascular Disease

## 2020-02-06 NOTE — Telephone Encounter (Signed)
  Patient Consent for Virtual Visit         [name] has provided verbal consent on 02/06/2020 for a virtual visit (video or telephone).   CONSENT FOR VIRTUAL VISIT FOR:  Antonio Watkins  By participating in this virtual visit I agree to the following:  I hereby voluntarily request, consent and authorize [facility] and its employed or contracted physicians, physician assistants, nurse practitioners or other licensed health care professionals (the Practitioner), to provide me with telemedicine health care services (the "Services") as deemed necessary by the treating Practitioner. I acknowledge and consent to receive the Services by the Practitioner via telemedicine. I understand that the telemedicine visit will involve communicating with the Practitioner through live audiovisual communication technology and the disclosure of certain medical information by electronic transmission. I acknowledge that I have been given the opportunity to request an in-person assessment or other available alternative prior to the telemedicine visit and am voluntarily participating in the telemedicine visit.  I understand that I have the right to withhold or withdraw my consent to the use of telemedicine in the course of my care at any time, without affecting my right to future care or treatment, and that the Practitioner or I may terminate the telemedicine visit at any time. I understand that I have the right to inspect all information obtained and/or recorded in the course of the telemedicine visit and may receive copies of available information for a reasonable fee.  I understand that some of the potential risks of receiving the Services via telemedicine include:  Marland Kitchen Delay or interruption in medical evaluation due to technological equipment failure or disruption; . Information transmitted may not be sufficient (e.g. poor resolution of images) to allow for appropriate medical decision making by the Practitioner;  and/or  . In rare instances, security protocols could fail, causing a breach of personal health information.  Furthermore, I acknowledge that it is my responsibility to provide information about my medical history, conditions and care that is complete and accurate to the best of my ability. I acknowledge that Practitioner's advice, recommendations, and/or decision may be based on factors not within their control, such as incomplete or inaccurate data provided by me or distortions of diagnostic images or specimens that may result from electronic transmissions. I understand that the practice of medicine is not an exact science and that Practitioner makes no warranties or guarantees regarding treatment outcomes. I acknowledge that a copy of this consent can be made available to me via my patient portal ([facility]), or I can request a printed copy by calling the office of [facility].    I understand that my insurance will be billed for this visit.   I have read or had this consent read to me. . I understand the contents of this consent, which adequately explains the benefits and risks of the Services being provided via telemedicine.  . I have been provided ample opportunity to ask questions regarding this consent and the Services and have had my questions answered to my satisfaction. . I give my informed consent for the services to be provided through the use of telemedicine in my medical care

## 2020-02-15 ENCOUNTER — Encounter: Payer: Self-pay | Admitting: Cardiovascular Disease

## 2020-02-15 ENCOUNTER — Telehealth (INDEPENDENT_AMBULATORY_CARE_PROVIDER_SITE_OTHER): Payer: Medicare PPO | Admitting: Cardiovascular Disease

## 2020-02-15 VITALS — Ht 70.0 in | Wt 210.0 lb

## 2020-02-15 DIAGNOSIS — I25118 Atherosclerotic heart disease of native coronary artery with other forms of angina pectoris: Secondary | ICD-10-CM | POA: Diagnosis not present

## 2020-02-15 DIAGNOSIS — D649 Anemia, unspecified: Secondary | ICD-10-CM | POA: Diagnosis not present

## 2020-02-15 DIAGNOSIS — I10 Essential (primary) hypertension: Secondary | ICD-10-CM | POA: Diagnosis not present

## 2020-02-15 DIAGNOSIS — I4819 Other persistent atrial fibrillation: Secondary | ICD-10-CM | POA: Diagnosis not present

## 2020-02-15 DIAGNOSIS — E785 Hyperlipidemia, unspecified: Secondary | ICD-10-CM

## 2020-02-15 DIAGNOSIS — Z7901 Long term (current) use of anticoagulants: Secondary | ICD-10-CM

## 2020-02-15 NOTE — Patient Instructions (Signed)
Your physician recommends that you schedule a follow-up appointment in: 3 MONTHS WITH DR KONESWARAN  Your physician recommends that you continue on your current medications as directed. Please refer to the Current Medication list given to you today.  Thank you for choosing [facility]!!   

## 2020-02-15 NOTE — Progress Notes (Signed)
Virtual Visit via Telephone Note   This visit type was conducted due to national recommendations for restrictions regarding the COVID-19 Pandemic (e.g. social distancing) in an effort to limit this patient's exposure and mitigate transmission in our community.  Due to his co-morbid illnesses, this patient is at least at moderate risk for complications without adequate follow up.  This format is felt to be most appropriate for this patient at this time.  The patient did not have access to video technology/had technical difficulties with video requiring transitioning to audio format only (telephone).  All issues noted in this document were discussed and addressed.  No physical exam could be performed with this format.  Please refer to the patient's chart for his  consent to telehealth for [facility].   The patient was identified using 2 identifiers.  Date:  02/15/2020   ID:  Antonio Watkins, DOB 1929/11/16, MRN [id number]  Patient Location: Home Provider Location: Home  PCP:  Jacqualine Code, DO  Cardiologist:  Kate Sable, MD  Electrophysiologist:  None   Evaluation Performed:  Follow-Up Visit  Chief Complaint:  CAD, a fib  History of Present Illness:    Antonio Watkins is a 86 y.o. male with coronary artery disease with drug-eluting stent placement to the ostial to proximal left circumflex and balloon angioplasty to the OM1 on 12/06/2018.  He also has persistent atrial fibrillation and normocytic anemia.  He denies chest pain and shortness of breath.  He denies bleeding problems.  One son and daughter are visiting for Mother's Day weekend.  His wife of 42 yrs passed away of pancreatic cancer on 11/20/2019.  He gets lonesome at night.   Social history: He has a son in [place] who is a Retail buyer.  He spent 21 years in prison but has turned his life around. He has another son in [place]. He has a daughter is a retired Nature conservation officer in [place]. He  has another daughter in [place], whom I've spoken to before). His wife of 28 yrs passed away of pancreatic cancer on 11/20/2019.   Past Medical History:  Diagnosis Date  . Anginal pain ([place])   . Atrial fibrillation ([place])   . CAD (coronary artery disease)   . Cancer of sigmoid colon ([place])    approx 1990  . DM type 2 (diabetes mellitus, type 2) ([place])   . GERD (gastroesophageal reflux disease)   . Gout   . Hiatal hernia   . Hyperlipemia   . Hypertension   . Incontinence    Past Surgical History:  Procedure Laterality Date  . BIOPSY  03/09/2019   Procedure: BIOPSY;  Surgeon: Danie Binder, MD;  [facility];  Service: Endoscopy;;  . cataracts    . CHOLECYSTECTOMY    . COLONOSCOPY     STOPPED BREATHING  . CORONARY STENT INTERVENTION  12/06/2018  . CORONARY STENT INTERVENTION N/A 12/06/2018   Procedure: CORONARY STENT INTERVENTION;  Surgeon: Jettie Booze, MD;  Location: [facility];  Service: Cardiovascular;  Laterality: N/A;  . 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   . ESOPHAGOGASTRODUODENOSCOPY N/A 03/21/2019   non-bleeding ulcers resulting from APC ablation of AVMs  . GIVENS CAPSULE STUDY N/A 03/22/2019    not complete to the cecum. However, a polypoid-type lesion with vascular component noted in small bowel, with plans for CTE for further evaluation. Query blue rubber bleb nevus  . MINOR CARPAL  TUNNEL    . REPLACEMENT TOTAL KNEE    . RIGHT/LEFT HEART CATH AND CORONARY ANGIOGRAPHY N/A 12/06/2018   Procedure: RIGHT/LEFT HEART CATH AND CORONARY ANGIOGRAPHY;  Surgeon: Jettie Booze, MD;  Location: [facility];  Service: Cardiovascular;  Laterality: N/A;     Current Meds  Medication Sig  . albuterol (PROVENTIL HFA;VENTOLIN HFA) 108 (90 Base) MCG/ACT inhaler Inhale 1 puff into the lungs every 6 (six) hours as needed for wheezing or shortness of breath.  Marland Kitchen atorvastatin (LIPITOR)  40 MG tablet TAKE 1 AND 1/2 TABLETS EVERY DAY  . donepezil (ARICEPT) 5 MG tablet Take 5 mg by mouth at bedtime.  . ferrous sulfate 325 (65 FE) MG EC tablet Take 325 mg by mouth 3 (three) times daily with meals. Pt said he only takes it twice a day  . hydrALAZINE (APRESOLINE) 100 MG tablet Take 100 mg by mouth 2 (two) times daily.  . hydrochlorothiazide (HYDRODIURIL) 12.5 MG tablet Take 12.5 mg by mouth daily with breakfast.   . isosorbide mononitrate (IMDUR) 60 MG 24 hr tablet TAKE 1 TABLET EVERY DAY  . losartan (COZAAR) 100 MG tablet Take 100 mg by mouth daily.  . nitroGLYCERIN (NITROSTAT) 0.4 MG SL tablet DISSOLVE 1 TABLET UNDER THE TONGUE EVERY 5 MINUTES AS NEEDED FOR CHEST PAIN  . pantoprazole (PROTONIX) 40 MG tablet Take 1 tablet (40 mg total) by mouth 2 (two) times daily before a meal.  . vitamin B-12 (CYANOCOBALAMIN) 1000 MCG tablet Take 1,000 mcg by mouth every Sunday.  Alveda Reasons 20 MG TABS tablet TAKE 1 TABLET (20 MG TOTAL) BY MOUTH DAILY WITH SUPPER.  . [DISCONTINUED] hydrALAZINE (APRESOLINE) 100 MG tablet TAKE 1 TABLET THREE TIMES DAILY ( DOSE INCREASE ) (Patient taking differently: 100 mg 2 (two) times daily. )     Allergies:   Morphine   Social History   Tobacco Use  . Smoking status: Current Some Day Smoker    Packs/day: 1.00    Years: 20.00    Pack years: 20.00    Types: Cigars    Start date: 07/18/1993    Last attempt to quit: 10/02/2017    Years since quitting: 2.3  . Smokeless tobacco: Never Used  Substance Use Topics  . Alcohol use: Yes    Alcohol/week: 0.0 standard drinks    Comment: Socially  . Drug use: Never     Family Hx: The patient's family history includes Colon cancer in his brother; Diabetes in his sister; Heart disease in his brother, mother, and sister; Lupus in his sister; Stomach cancer in his sister.  ROS:   Please see the history of present illness.     All other systems reviewed and are negative.   Prior CV studies:   The following  studies were reviewed today:  Echo 04/30/19:  1. The left ventricle has normal systolic function with an ejection  fraction of 60-65%. The cavity size was normal. There is moderate  concentric left ventricular hypertrophy. Left ventricular diastolic  function could not be evaluated secondary to atrial  fibrillation. Elevated left ventricular end-diastolic pressure No  evidence of left ventricular regional wall motion abnormalities.  2. The right ventricle has normal systolic function. The cavity was  normal. There is no increase in right ventricular wall thickness.  3. Left atrial size was severely dilated.  4. Right atrial size was mildly dilated.  5. The mitral valve is grossly normal. Mild thickening of the mitral  valve leaflet.  6. The tricuspid  valve is grossly normal.  7. The aortic valve is tricuspid. Mild thickening of the aortic valve.  Mild calcification of the aortic valve.  8. Moderate aortic annular calcification.  Labs/Other Tests and Data Reviewed:    EKG:  No ECG reviewed.  Recent Labs: 04/29/2019: Magnesium 1.7 07/24/2019: ALT 24; BUN 17; Creatinine, Ser 1.19; Hemoglobin 12.4; Platelets 194; Potassium 3.7; Sodium 131   Recent Lipid Panel No results found for: CHOL, TRIG, HDL, CHOLHDL, LDLCALC, LDLDIRECT  Wt Readings from Last 3 Encounters:  02/15/20 210 lb (95.3 kg)  08/01/19 205 lb 6.4 oz (93.2 kg)  07/23/19 206 lb 4.8 oz (93.6 kg)     Objective:    Vital Signs:  Ht 5\' 10"  (1.778 m)   Wt 210 lb (95.3 kg)   BMI 30.13 kg/m    VITAL SIGNS:  reviewed  ASSESSMENT & PLAN:    1. Coronary artery disease: Symptomatically stable. He underwent DES to the ostial to proximal left circumflex and balloon angioplasty to the OM1 (residual 30% stenosis) on 12/06/18. Continue atorvastatin and Imdur.  He is no longer on antiplatelet therapy due to issues with GI bleeding and anemia. He is back on Xarelto.  2. Persistent atrial fibrillation: Symptomatically  stable. Not on beta blocker therapy due to bradycardia while hospitalized at [facility] in 12/2018.  He is back on Xarelto.  3.  Normocytic anemia: He follows with hematology and has received Feraheme in the past.  Hemoglobin 12.4 on 07/24/2019.  4. Hyperlipidemia: Continue atorvastatin.  5. Hypertension: No changes to therapy.    COVID-19 Education: The signs and symptoms of COVID-19 were discussed with the patient and how to seek care for testing (follow up with PCP or arrange E-visit).  The importance of social distancing was discussed today.  Time:   Today, I have spent 20 minutes with the patient with telehealth technology discussing the above problems.     Medication Adjustments/Labs and Tests Ordered: Current medicines are reviewed at length with the patient today.  Concerns regarding medicines are outlined above.   Tests Ordered: No orders of the defined types were placed in this encounter.   Medication Changes: No orders of the defined types were placed in this encounter.   Follow Up:  Virtual visit 3 months  Signed, Kate Sable, MD  02/15/2020 1:37 PM    [facility]

## 2020-04-27 ENCOUNTER — Other Ambulatory Visit: Payer: Self-pay | Admitting: Cardiovascular Disease

## 2020-05-21 ENCOUNTER — Other Ambulatory Visit: Payer: Self-pay | Admitting: Family Medicine

## 2020-05-27 ENCOUNTER — Telehealth: Payer: Medicare PPO | Admitting: Cardiovascular Disease

## 2020-05-31 ENCOUNTER — Other Ambulatory Visit: Payer: Self-pay | Admitting: Cardiology

## 2020-06-04 ENCOUNTER — Encounter: Payer: Self-pay | Admitting: Cardiology

## 2020-06-04 ENCOUNTER — Other Ambulatory Visit: Payer: Self-pay

## 2020-06-04 ENCOUNTER — Ambulatory Visit (INDEPENDENT_AMBULATORY_CARE_PROVIDER_SITE_OTHER): Payer: Medicare PPO | Admitting: Cardiology

## 2020-06-04 VITALS — BP 112/62 | HR 72 | Ht 70.0 in | Wt 202.2 lb

## 2020-06-04 DIAGNOSIS — I2510 Atherosclerotic heart disease of native coronary artery without angina pectoris: Secondary | ICD-10-CM

## 2020-06-04 DIAGNOSIS — I4819 Other persistent atrial fibrillation: Secondary | ICD-10-CM | POA: Diagnosis not present

## 2020-06-04 DIAGNOSIS — R0602 Shortness of breath: Secondary | ICD-10-CM | POA: Diagnosis not present

## 2020-06-04 MED ORDER — HYDRALAZINE HCL 100 MG PO TABS: 100.0000 mg | ORAL_TABLET | Freq: Two times a day (BID) | ORAL | 3 refills | Status: DC

## 2020-06-04 NOTE — Patient Instructions (Addendum)

## 2020-06-04 NOTE — Progress Notes (Signed)
Clinical Summary Mr. Ruppert is a 86 y.o.male seen today for follow up of the following medical problems.   1. CAD - history of DES to LCX and PTCA of OM1 in 11/2018 -no recent chest pains - occasioanl SOB, can occur at rest or with activity. No other associated symptos - lasts a few minutes. Staretd few weeks ago. His family feels it improves with prn albuterol. - chronic LE edema.    2. Persistent afib - bradycardia on beta blocker previously.  - no recent palpitations - compliant with meds     He has a son in [place] who is a Retail buyer. He spent 21 years in prison but has turned his life around. He has another son in [place].He has a daughter is a retired Nature conservation officer in [place]. He has another daughter in [place], whom I've spoken to before). His wife of 31 yrs passed away of pancreatic cancer on 11/20/2019.  SH: has had covid vaccine.  Past Medical History:  Diagnosis Date  . Anginal pain ([place])   . Atrial fibrillation ([place])   . CAD (coronary artery disease)   . Cancer of sigmoid colon ([place])    approx 1990  . DM type 2 (diabetes mellitus, type 2) ([place])   . GERD (gastroesophageal reflux disease)   . Gout   . Hiatal hernia   . Hyperlipemia   . Hypertension   . Incontinence      Allergies  Allergen Reactions  . Morphine     Other reaction(s): Angioedema     Current Outpatient Medications  Medication Sig Dispense Refill  . albuterol (PROVENTIL HFA;VENTOLIN HFA) 108 (90 Base) MCG/ACT inhaler Inhale 1 puff into the lungs every 6 (six) hours as needed for wheezing or shortness of breath. 1 Inhaler 2  . atorvastatin (LIPITOR) 40 MG tablet TAKE 1 AND 1/2 TABLETS EVERY DAY (NEED VISIT) 45 tablet 0  . donepezil (ARICEPT) 5 MG tablet Take 5 mg by mouth at bedtime.    . ferrous sulfate 325 (65 FE) MG EC tablet Take 325 mg by mouth 3 (three) times daily with meals. Pt said he only takes it twice a day    . hydrALAZINE (APRESOLINE)  100 MG tablet Take 100 mg by mouth 2 (two) times daily.    . hydrochlorothiazide (HYDRODIURIL) 12.5 MG tablet Take 12.5 mg by mouth daily with breakfast.     . isosorbide mononitrate (IMDUR) 60 MG 24 hr tablet TAKE 1 TABLET EVERY DAY 90 tablet 1  . losartan (COZAAR) 100 MG tablet Take 100 mg by mouth daily.    . nitroGLYCERIN (NITROSTAT) 0.4 MG SL tablet DISSOLVE 1 TABLET UNDER THE TONGUE EVERY 5 MINUTES AS NEEDED FOR CHEST PAIN 30 tablet 3  . pantoprazole (PROTONIX) 40 MG tablet Take 1 tablet (40 mg total) by mouth 2 (two) times daily before a meal. 60 tablet 5  . vitamin B-12 (CYANOCOBALAMIN) 1000 MCG tablet Take 1,000 mcg by mouth every Sunday.    Alveda Reasons 20 MG TABS tablet TAKE 1 TABLET (20 MG TOTAL) BY MOUTH DAILY WITH SUPPER. 90 tablet 1   No current facility-administered medications for this visit.     Past Surgical History:  Procedure Laterality Date  . BIOPSY  03/09/2019   Procedure: BIOPSY;  Surgeon: Danie Binder, MD;  [facility];  Service: Endoscopy;;  . cataracts    . CHOLECYSTECTOMY    . COLONOSCOPY     STOPPED BREATHING  . CORONARY  STENT INTERVENTION  12/06/2018  . CORONARY STENT INTERVENTION N/A 12/06/2018   Procedure: CORONARY STENT INTERVENTION;  Surgeon: Jettie Booze, MD;  Location: [place] [facility];  Service: Cardiovascular;  Laterality: N/A;  . 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   . ESOPHAGOGASTRODUODENOSCOPY N/A 03/21/2019   non-bleeding ulcers resulting from APC ablation of AVMs  . GIVENS CAPSULE STUDY N/A 03/22/2019    not complete to the cecum. However, a polypoid-type lesion with vascular component noted in small bowel, with plans for CTE for further evaluation. Query blue rubber bleb nevus  . MINOR CARPAL TUNNEL    . REPLACEMENT TOTAL KNEE    . RIGHT/LEFT HEART CATH AND CORONARY ANGIOGRAPHY N/A 12/06/2018   Procedure: RIGHT/LEFT HEART CATH AND CORONARY ANGIOGRAPHY;  Surgeon:  Jettie Booze, MD;  Location: [place];  Service: Cardiovascular;  Laterality: N/A;     Allergies  Allergen Reactions  . Morphine     Other reaction(s): Angioedema      Family History  Problem Relation Age of Onset  . Heart disease Mother   . Heart disease Brother   . Diabetes Sister   . Colon cancer Brother        over age 45  . Stomach cancer Sister   . Lupus Sister   . Heart disease Sister      Social History Mr. Delacruz reports that he has been smoking cigars. He started smoking about 26 years ago. He has a 20.00 pack-year smoking history. He has never used smokeless tobacco. Mr. Anna reports current alcohol use.   Review of Systems CONSTITUTIONAL: No weight loss, fever, chills, weakness or fatigue.  HEENT: Eyes: No visual loss, blurred vision, double vision or yellow sclerae.No hearing loss, sneezing, congestion, runny nose or sore throat.  SKIN: No rash or itching.  CARDIOVASCULAR:  Per hpi RESPIRATORY: per hpi GASTROINTESTINAL: No anorexia, nausea, vomiting or diarrhea. No abdominal pain or blood.  GENITOURINARY: No burning on urination, no polyuria NEUROLOGICAL: No headache, dizziness, syncope, paralysis, ataxia, numbness or tingling in the extremities. No change in bowel or bladder control.  MUSCULOSKELETAL: No muscle, back pain, joint pain or stiffness.  LYMPHATICS: No enlarged nodes. No history of splenectomy.  PSYCHIATRIC: No history of depression or anxiety.  ENDOCRINOLOGIC: No reports of sweating, cold or heat intolerance. No polyuria or polydipsia.  Marland Kitchen   Physical Examination Today's Vitals   06/04/20 1358  BP: 112/62  Pulse: 72  SpO2: 98%  Weight: 202 lb 3.2 oz (91.7 kg)  Height: 5\' 10"  (1.778 m)   Body mass index is 29.01 kg/m.  Gen: resting comfortably, no acute distress HEENT: no scleral icterus, pupils equal round and reactive, no palptable cervical adenopathy,  CV: irreg, no m/r/g, no jvd Resp: Clear to auscultation  bilaterally GI: abdomen is soft, non-tender, non-distended, normal bowel sounds, no hepatosplenomegaly MSK: extremities are warm, no edema.  Skin: warm, no rash Neuro:  no focal deficits Psych: appropriate affect   Diagnostic Studies  Echo 04/30/19:  1. The left ventricle has normal systolic function with an ejection  fraction of 60-65%. The cavity size was normal. There is moderate  concentric left ventricular hypertrophy. Left ventricular diastolic  function could not be evaluated secondary to atrial  fibrillation. Elevated left ventricular end-diastolic pressure No  evidence of left ventricular regional wall motion abnormalities.  2. The right ventricle has normal systolic function. The cavity was  normal. There is no increase  in right ventricular wall thickness.  3. Left atrial size was severely dilated.  4. Right atrial size was mildly dilated.  5. The mitral valve is grossly normal. Mild thickening of the mitral  valve leaflet.  6. The tricuspid valve is grossly normal.  7. The aortic valve is tricuspid. Mild thickening of the aortic valve.  Mild calcification of the aortic valve.  8. Moderate aortic annular calcification.    Assessment and Plan  1. Coronary artery disease - no chest pains, conitnue current meds  2. Persistent atrial fibrillation - not requiring av nodal agents, previous bradycardia on beta blocker - continue currnet meds including anticoag - EKG shows rate controlled afib  3. SOB - nonspecific symptoms, not specifically exertional - family feels improves with prn albuterol - monitor at this time, if progresses would consider PFTs         Arnoldo Lenis, M.D.

## 2020-06-12 ENCOUNTER — Other Ambulatory Visit: Payer: Self-pay | Admitting: Cardiology

## 2020-06-24 ENCOUNTER — Other Ambulatory Visit: Payer: Self-pay

## 2020-06-24 MED ORDER — RIVAROXABAN 20 MG PO TABS: ORAL_TABLET | ORAL | 1 refills | Status: AC

## 2020-12-08 ENCOUNTER — Telehealth: Payer: Self-pay | Admitting: Cardiology

## 2020-12-08 ENCOUNTER — Encounter: Payer: Self-pay | Admitting: Cardiology

## 2020-12-08 ENCOUNTER — Telehealth (INDEPENDENT_AMBULATORY_CARE_PROVIDER_SITE_OTHER): Payer: Medicare PPO | Admitting: Cardiology

## 2020-12-08 VITALS — BP 117/66 | HR 77 | Ht 70.0 in | Wt 185.0 lb

## 2020-12-08 DIAGNOSIS — I2510 Atherosclerotic heart disease of native coronary artery without angina pectoris: Secondary | ICD-10-CM | POA: Diagnosis not present

## 2020-12-08 DIAGNOSIS — I4819 Other persistent atrial fibrillation: Secondary | ICD-10-CM | POA: Diagnosis not present

## 2020-12-08 DIAGNOSIS — E782 Mixed hyperlipidemia: Secondary | ICD-10-CM

## 2020-12-08 MED ORDER — ROSUVASTATIN CALCIUM 5 MG PO TABS: 5.0000 mg | ORAL_TABLET | Freq: Every day | ORAL | 3 refills | Status: DC

## 2020-12-08 NOTE — Telephone Encounter (Signed)
I connected with  Antonio Watkins on 12/08/20 by a Telephone enabled telemedicine application and verified that I am speaking with the correct person using two identifiers.   I discussed the limitations of evaluation and management by telemedicine. The patient expressed understanding and agreed to proceed.

## 2020-12-08 NOTE — Progress Notes (Signed)
Virtual Visit via Telephone Note   This visit type was conducted due to national recommendations for restrictions regarding the COVID-19 Pandemic (e.g. social distancing) in an effort to limit this patient's exposure and mitigate transmission in our community.  Due to his co-morbid illnesses, this patient is at least at moderate risk for complications without adequate follow up.  This format is felt to be most appropriate for this patient at this time.  The patient did not have access to video technology/had technical difficulties with video requiring transitioning to audio format only (telephone).  All issues noted in this document were discussed and addressed.  No physical exam could be performed with this format.  Please refer to the patient's chart for his  consent to telehealth for [facility].    Date:  12/08/2020   ID:  Antonio Watkins, DOB Jun 16, 1930, MRN [id number] The patient was identified using 2 identifiers.  Patient Location: Home Provider Location: Office/Clinic   PCP:  Favero, John Patrick, [place]  Cardiologist:  Carlyle Dolly, MD  Advanced Practice Provider:  No care team member to display Electrophysiologist:  None    Chief Complaint:  Follow up visit  History of Present Illness:    Antonio Watkins is Antonio 87 y.o. male seen today for follow up of the following medical problems.   1. CAD - history of DES to LCX and PTCA of OM1 in 11/2018 -no recent chest pains - occasioanl SOB, can occur at rest or with activity. No other associated symptos - lasts Antonio few minutes. Staretd few weeks ago. His family feels it improves with prn albuterol. - chronic LE edema.   - no recent chest pain - no SOB or DOe - compliant with meds   2. Persistent afib - bradycardia on beta blocker previously.  - no recent palpitations - compliant with meds  - no recent palpitations - no bleeding   3. Hyperlipidemia - unclear what happened to  his statin, no long on his list. Had been on atorva 40mg  daily - he is not sure what happened either   He has [place] who is Antonio Retail buyer. He spent 21 years in prison but has turned his life around.He has Antonio Watkins.He has adaughter is Antonio retired Nature conservation officer in [place]. He has Antonio Watkins His wife of 97 yrs passed away of pancreatic cancer on 11/20/2019.    The patient does not have symptoms concerning for COVID-19 infection (fever, chills, cough, or new shortness of breath).    Past Medical History:  Diagnosis Date  . Anginal pain ([place])   . Atrial fibrillation ([place])   . CAD (coronary artery disease)   . Cancer of sigmoid colon ([place])    approx 1990  . DM type 2 (diabetes mellitus, type 2) ([place])   . GERD (gastroesophageal reflux disease)   . Gout   . Hiatal hernia   . Hyperlipemia   . Hypertension   . Incontinence    Past Surgical History:  Procedure Laterality Date  . BIOPSY  03/09/2019   Procedure: BIOPSY;  Surgeon: Danie Binder, MD;  [facility];  Service: Endoscopy;;  . cataracts    . CHOLECYSTECTOMY    . COLONOSCOPY     STOPPED BREATHING  . CORONARY STENT INTERVENTION  12/06/2018  . CORONARY STENT INTERVENTION N/Antonio 12/06/2018   Procedure: CORONARY STENT INTERVENTION;  Surgeon: Jettie Booze, MD;  [facility]  LAB;  Service: Cardiovascular;  Laterality: N/Antonio;  . ESOPHAGOGASTRODUODENOSCOPY N/Antonio 03/09/2019   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   . ESOPHAGOGASTRODUODENOSCOPY N/Antonio 03/21/2019   non-bleeding ulcers resulting from APC ablation of AVMs  . GIVENS CAPSULE STUDY N/Antonio 03/22/2019    not complete to the cecum. However, Antonio polypoid-type lesion with vascular component noted in small bowel, with plans for CTE for further evaluation. Query blue rubber bleb nevus  . MINOR CARPAL TUNNEL    . REPLACEMENT TOTAL KNEE    . RIGHT/LEFT HEART CATH AND CORONARY  ANGIOGRAPHY N/Antonio 12/06/2018   Procedure: RIGHT/LEFT HEART CATH AND CORONARY ANGIOGRAPHY;  Surgeon: Jettie Booze, MD;  Location: [place] [facility];  Service: Cardiovascular;  Laterality: N/Antonio;     Current Meds  Medication Sig  . albuterol (PROVENTIL HFA;VENTOLIN HFA) 108 (90 Base) MCG/ACT inhaler Inhale 1 puff into the lungs every 6 (six) hours as needed for wheezing or shortness of breath.  . donepezil (ARICEPT) 10 MG tablet Take 10 mg by mouth at bedtime.  . ferrous sulfate 325 (65 FE) MG EC tablet Take 325 mg by mouth 3 (three) times daily with meals. Pt said he only takes it twice Antonio day  . hydrALAZINE (APRESOLINE) 100 MG tablet Take 1 tablet (100 mg total) by mouth 2 (two) times daily.  . isosorbide mononitrate (IMDUR) 60 MG 24 hr tablet TAKE 1 TABLET EVERY DAY  . losartan (COZAAR) 100 MG tablet Take 100 mg by mouth daily.  . mirtazapine (REMERON) 15 MG tablet Take 7.5 mg by mouth daily.   . nitroGLYCERIN (NITROSTAT) 0.4 MG SL tablet DISSOLVE 1 TABLET UNDER THE TONGUE EVERY 5 MINUTES AS NEEDED FOR CHEST PAIN  . Omega-3 Fatty Acids (FISH OIL) 1000 MG CAPS Take 1 capsule by mouth daily.  . Potassium 99 MG TABS Take 1 tablet by mouth daily.  . rivaroxaban (XARELTO) 20 MG TABS tablet TAKE 1 TABLET (20 MG TOTAL) BY MOUTH DAILY WITH SUPPER.  . vitamin B-12 (CYANOCOBALAMIN) 1000 MCG tablet Take 1,000 mcg by mouth every Sunday.     Allergies:   Morphine   Social History   Tobacco Use  . Smoking status: Current Some Day Smoker    Packs/day: 1.00    Years: 20.00    Pack years: 20.00    Types: Cigars    Start date: 07/18/1993    Last attempt to quit: 10/02/2017    Years since quitting: 3.1  . Smokeless tobacco: Never Used  . Tobacco comment: smoke Antonio cigar once per week  Vaping Use  . Vaping Use: Never used  Substance Use Topics  . Alcohol use: Yes    Alcohol/week: 0.0 standard drinks    Comment: Socially  . Drug use: Never     Family Hx: The patient's family history includes  Colon cancer in his brother; Diabetes in his sister; Heart disease in his brother, mother, and sister; Lupus in his sister; Stomach cancer in his sister.  ROS:   Please see the history of present illness.     All other systems reviewed and are negative.   Prior CV studies:   The following studies were reviewed today:   Labs/Other Tests and Data Reviewed:    EKG:  No ECG reviewed.  Recent Labs: No results found for requested labs within last 8760 hours.   Recent Lipid Panel No results found for: CHOL, TRIG, HDL, CHOLHDL, LDLCALC, LDLDIRECT  Wt Readings from Last 3 Encounters:  12/08/20  185 lb (83.9 kg)  06/04/20 202 lb 3.2 oz (91.7 kg)  02/15/20 210 lb (95.3 kg)     Risk Assessment/Calculations:      Objective:    Vital Signs:  BP 117/66   Pulse 77   Ht 5\' 10"  (1.778 m)   Wt 185 lb (83.9 kg)   BMI 26.54 kg/m    Normal affect. Normal speech pattern and tone. Comfortable, no apparent distress. No audible signs of sob or wheezing .  ASSESSMENT & PLAN:    1. CAD - no symptoms continue current meds   2. Hyperlipidemia - unclear what happened with statin, appears he may have had side effects on atorva 76m according to charting and stopped - will try crestor 5mg  daily.   3. Afib - no symptoms, continue current meds       COVID-19 Education: The signs and symptoms of COVID-19 were discussed with the patient and how to seek care for testing (follow up with PCP or arrange E-visit).  The importance of social distancing was discussed today.  Time:   Today, I have spent 16 minutes with the patient with telehealth technology discussing the above problems.     Medication Adjustments/Labs and Tests Ordered: Current medicines are reviewed at length with the patient today.  Concerns regarding medicines are outlined above.   Tests Ordered: No orders of the defined types were placed in this encounter.   Medication Changes: No orders of the defined types were placed  in this encounter.   Follow Up:  In Person in 6 month(s)  Signed, Carlyle Dolly, MD  12/08/2020 3:20 PM

## 2020-12-08 NOTE — Addendum Note (Signed)
Addended by: Merlene Laughter on: 12/08/2020 03:27 PM   Modules accepted: Orders

## 2020-12-08 NOTE — Patient Instructions (Addendum)
Medication Instructions:  Your physician has recommended you make the following change in your medication:  Start crestor 5 mg by mouth daily at bedtime Continue other medications the same  Labwork: none  Testing/Procedures: none  Follow-Up: Your physician recommends that you schedule a follow-up appointment in: 6 months  Any Other Special Instructions Will Be Listed Below (If Applicable).  If you need a refill on your cardiac medications before your next appointment, please call your pharmacy. 

## 2021-03-19 ENCOUNTER — Other Ambulatory Visit: Payer: Self-pay | Admitting: Cardiology

## 2021-04-15 ENCOUNTER — Encounter: Payer: Self-pay | Admitting: Cardiology

## 2021-04-15 ENCOUNTER — Ambulatory Visit (INDEPENDENT_AMBULATORY_CARE_PROVIDER_SITE_OTHER): Payer: Medicare PPO | Admitting: Cardiology

## 2021-04-15 ENCOUNTER — Encounter: Payer: Self-pay | Admitting: *Deleted

## 2021-04-15 VITALS — BP 140/90 | HR 97 | Ht 69.0 in | Wt 193.6 lb

## 2021-04-15 DIAGNOSIS — I4819 Other persistent atrial fibrillation: Secondary | ICD-10-CM | POA: Diagnosis not present

## 2021-04-15 DIAGNOSIS — E782 Mixed hyperlipidemia: Secondary | ICD-10-CM

## 2021-04-15 DIAGNOSIS — I10 Essential (primary) hypertension: Secondary | ICD-10-CM

## 2021-04-15 DIAGNOSIS — I2510 Atherosclerotic heart disease of native coronary artery without angina pectoris: Secondary | ICD-10-CM | POA: Diagnosis not present

## 2021-04-15 NOTE — Progress Notes (Signed)
Clinical Summary Antonio Watkins is a 87 y.o.maleseen today for follow up of the following medical problems.    1. CAD - history of DES to LCX and PTCA of OM1 in 11/2018   - no chest pains, no sob/doe - compliant with meds     2. Persistent afib - bradycardia on beta blocker previously.  - no recent palpitations - compliant with meds   - no palpitations. No bleeding on xarelto   3. Hyperlipidemia -labs followed by pcp, compliant with statin     He has a son in [place] who is a Retail buyer.  He spent 21 years in prison but has turned his life around. He has another son in [place]. He has a daughter is a retired Nature conservation officer in [place]. He has another daughter in [place] His wife of 72 yrs passed away of pancreatic cancer on 11/20/2019.   Past Medical History:  Diagnosis Date   Anginal pain ([place])    Atrial fibrillation ([place])    CAD (coronary artery disease)    Cancer of sigmoid colon ([facility])    approx 1990   DM type 2 (diabetes mellitus, type 2) ([facility])    GERD (gastroesophageal reflux disease)    Gout    Hiatal hernia    Hyperlipemia    Hypertension    Incontinence      Allergies  Allergen Reactions   Morphine     Other reaction(s): Angioedema     Current Outpatient Medications  Medication Sig Dispense Refill   albuterol (PROVENTIL HFA;VENTOLIN HFA) 108 (90 Base) MCG/ACT inhaler Inhale 1 puff into the lungs every 6 (six) hours as needed for wheezing or shortness of breath. 1 Inhaler 2   amLODipine (NORVASC) 5 MG tablet Take 1 tablet by mouth daily. (Patient not taking: Reported on 12/08/2020)     donepezil (ARICEPT) 10 MG tablet Take 10 mg by mouth at bedtime.     ferrous sulfate 325 (65 FE) MG EC tablet Take 325 mg by mouth 3 (three) times daily with meals. Pt said he only takes it twice a day     hydrALAZINE (APRESOLINE) 100 MG tablet TAKE 1 TABLET TWICE DAILY 180 tablet 3   isosorbide mononitrate (IMDUR) 60 MG 24 hr tablet TAKE 1  TABLET EVERY DAY 90 tablet 1   losartan (COZAAR) 100 MG tablet Take 100 mg by mouth daily.     mirtazapine (REMERON) 15 MG tablet Take 7.5 mg by mouth daily.      nitroGLYCERIN (NITROSTAT) 0.4 MG SL tablet DISSOLVE 1 TABLET UNDER THE TONGUE EVERY 5 MINUTES AS NEEDED FOR CHEST PAIN 30 tablet 3   Omega-3 Fatty Acids (FISH OIL) 1000 MG CAPS Take 1 capsule by mouth daily.     Potassium 99 MG TABS Take 1 tablet by mouth daily.     rivaroxaban (XARELTO) 20 MG TABS tablet TAKE 1 TABLET (20 MG TOTAL) BY MOUTH DAILY WITH SUPPER. 90 tablet 1   rosuvastatin (CRESTOR) 5 MG tablet Take 1 tablet (5 mg total) by mouth daily. 90 tablet 3   vitamin B-12 (CYANOCOBALAMIN) 1000 MCG tablet Take 1,000 mcg by mouth every Sunday.     No current facility-administered medications for this visit.     Past Surgical History:  Procedure Laterality Date   BIOPSY  03/09/2019   Procedure: BIOPSY;  Surgeon: Danie Binder, MD;  [facility];  Service: Endoscopy;;   cataracts     CHOLECYSTECTOMY     COLONOSCOPY  STOPPED BREATHING   CORONARY STENT INTERVENTION  12/06/2018   CORONARY STENT INTERVENTION N/A 12/06/2018   Procedure: CORONARY STENT INTERVENTION;  Surgeon: Jettie Booze, MD;  Location: [place] CV LAB;  Service: Cardiovascular;  Laterality: N/A;   ESOPHAGOGASTRODUODENOSCOPY N/A 03/09/2019   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    ESOPHAGOGASTRODUODENOSCOPY N/A 03/21/2019   non-bleeding ulcers resulting from APC ablation of AVMs   GIVENS CAPSULE STUDY N/A 03/22/2019    not complete to the cecum. However, a polypoid-type lesion with vascular component noted in small bowel, with plans for CTE for further evaluation. Query blue rubber bleb nevus   MINOR CARPAL TUNNEL     REPLACEMENT TOTAL KNEE     RIGHT/LEFT HEART CATH AND CORONARY ANGIOGRAPHY N/A 12/06/2018   Procedure: RIGHT/LEFT HEART CATH AND CORONARY ANGIOGRAPHY;  Surgeon: Jettie Booze, MD;  Location: [place]  CV LAB;  Service: Cardiovascular;  Laterality: N/A;     Allergies  Allergen Reactions   Morphine     Other reaction(s): Angioedema      Family History  Problem Relation Age of Onset   Heart disease Mother    Heart disease Brother    Diabetes Sister    Colon cancer Brother        over age 9   Stomach cancer Sister    Lupus Sister    Heart disease Sister      Social History Antonio Watkins reports that he has been smoking cigars. He started smoking about 27 years ago. He has a 20.00 pack-year smoking history. He has never used smokeless tobacco. Antonio Watkins reports current alcohol use.   Review of Systems CONSTITUTIONAL: No weight loss, fever, chills, weakness or fatigue.  HEENT: Eyes: No visual loss, blurred vision, double vision or yellow sclerae.No hearing loss, sneezing, congestion, runny nose or sore throat.  SKIN: No rash or itching.  CARDIOVASCULAR: per hpi RESPIRATORY: No shortness of breath, cough or sputum.  GASTROINTESTINAL: No anorexia, nausea, vomiting or diarrhea. No abdominal pain or blood.  GENITOURINARY: No burning on urination, no polyuria NEUROLOGICAL: No headache, dizziness, syncope, paralysis, ataxia, numbness or tingling in the extremities. No change in bowel or bladder control.  MUSCULOSKELETAL: No muscle, back pain, joint pain or stiffness.  LYMPHATICS: No enlarged nodes. No history of splenectomy.  PSYCHIATRIC: No history of depression or anxiety.  ENDOCRINOLOGIC: No reports of sweating, cold or heat intolerance. No polyuria or polydipsia.  Marland Kitchen   Physical Examination Today's Vitals   04/15/21 1605  BP: 140/90  Pulse: 97  SpO2: 98%  Weight: 193 lb 9.6 oz (87.8 kg)  Height: 5\' 9"  (1.753 m)   Body mass index is 28.59 kg/m.  Gen: resting comfortably, no acute distress HEENT: no scleral icterus, pupils equal round and reactive, no palptable cervical adenopathy,  CV: irreg, no m/r/g, no jvd Resp: Clear to auscultation bilaterally GI: abdomen  is soft, non-tender, non-distended, normal bowel sounds, no hepatosplenomegaly MSK: extremities are warm, no edema.  Skin: warm, no rash Neuro:  no focal deficits Psych: appropriate affect   Diagnostic Studies     Assessment and Plan  1. CAD - no symptoms, continue current meds     2. Hyperlipidemia - continue statin, request pcp labs   3. Afib - no symptoms - off av nodal agents due to braydcardia. EKG today shows afib at 97 - continue anticoag  4. HTN - elevated today, he will call with home bp's in 1 week. Would start  norvasc 5mg  daily if needed for bp     Arnoldo Lenis, M.D.

## 2021-04-15 NOTE — Patient Instructions (Addendum)
Medication Instructions:  Your physician recommends that you continue on your current medications as directed. Please refer to the Current Medication list given to you today.  Labwork: none  Testing/Procedures: none  Follow-Up: Your physician recommends that you schedule a follow-up appointment in: 6 months  Any Other Special Instructions Will Be Listed Below (If Applicable). Your physician has requested that you regularly monitor and record your blood pressure readings at home for one week. Please use the same machine at the same time of day to check your readings and record them. Call office with your readings.  If you need a refill on your cardiac medications before your next appointment, please call your pharmacy.

## 2021-06-30 ENCOUNTER — Ambulatory Visit: Payer: Medicare PPO | Admitting: Cardiology

## 2021-10-15 ENCOUNTER — Ambulatory Visit: Payer: Medicare PPO | Admitting: Cardiology

## 2021-10-15 NOTE — Progress Notes (Deleted)
Clinical Summary Antonio Watkins is a 87 y.o.male seen today for follow up of the following medical problems.    1. CAD - history of DES to LCX and PTCA of OM1 in 11/2018     - no chest pains, no sob/doe - compliant with meds     2. Persistent afib - bradycardia on beta blocker previously.  - no recent palpitations - compliant with meds   - no palpitations. No bleeding on xarelto   3. Hyperlipidemia -labs followed by pcp, compliant with statin     He has a son in [place].  He spent 21 years in prison but has turned his life around. He has another son in [place]. He has a daughter is a retired Nature conservation officer in [place]. He has another daughter in [place] His wife of 43 yrs passed away of pancreatic cancer on 11/20/2019.     Past Medical History:  Diagnosis Date   Anginal pain ([place])    Atrial fibrillation ([place])    CAD (coronary artery disease)    Cancer of sigmoid colon ([facility])    approx 1990   DM type 2 (diabetes mellitus, type 2) ([facility])    GERD (gastroesophageal reflux disease)    Gout    Hiatal hernia    Hyperlipemia    Hypertension    Incontinence      Allergies  Allergen Reactions   Morphine     Other reaction(s): Angioedema     Current Outpatient Medications  Medication Sig Dispense Refill   albuterol (PROVENTIL HFA;VENTOLIN HFA) 108 (90 Base) MCG/ACT inhaler Inhale 1 puff into the lungs every 6 (six) hours as needed for wheezing or shortness of breath. 1 Inhaler 2   donepezil (ARICEPT) 10 MG tablet Take 10 mg by mouth at bedtime.     ferrous sulfate 325 (65 FE) MG EC tablet Take 325 mg by mouth 3 (three) times daily with meals. Pt said he only takes it twice a day     hydrALAZINE (APRESOLINE) 100 MG tablet TAKE 1 TABLET TWICE DAILY 180 tablet 3   isosorbide mononitrate (IMDUR) 60 MG 24 hr tablet TAKE 1 TABLET EVERY DAY 90 tablet 1   losartan (COZAAR) 100 MG tablet Take 100 mg by mouth daily.     mirtazapine (REMERON) 15 MG  tablet Take 7.5 mg by mouth daily.      nitroGLYCERIN (NITROSTAT) 0.4 MG SL tablet DISSOLVE 1 TABLET UNDER THE TONGUE EVERY 5 MINUTES AS NEEDED FOR CHEST PAIN 30 tablet 3   Omega-3 Fatty Acids (FISH OIL) 1000 MG CAPS Take 1 capsule by mouth daily.     Potassium 99 MG TABS Take 1 tablet by mouth daily.     prednisoLONE acetate (PRED FORTE) 1 % ophthalmic suspension daily.     rivaroxaban (XARELTO) 20 MG TABS tablet TAKE 1 TABLET (20 MG TOTAL) BY MOUTH DAILY WITH SUPPER. 90 tablet 1   rosuvastatin (CRESTOR) 5 MG tablet Take 1 tablet (5 mg total) by mouth daily. 90 tablet 3   vitamin B-12 (CYANOCOBALAMIN) 1000 MCG tablet Take 1,000 mcg by mouth every Sunday.     No current facility-administered medications for this visit.     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    ESOPHAGOGASTRODUODENOSCOPY N/A 03/21/2019   non-bleeding ulcers resulting from APC ablation of AVMs   GIVENS CAPSULE STUDY N/A 03/22/2019    not complete to the cecum. However, a polypoid-type lesion with vascular component noted in small bowel, with plans for CTE for further evaluation. Query blue rubber bleb nevus   MINOR CARPAL TUNNEL     REPLACEMENT TOTAL KNEE     RIGHT/LEFT HEART CATH AND CORONARY ANGIOGRAPHY N/A 12/06/2018   Procedure: RIGHT/LEFT HEART CATH AND CORONARY ANGIOGRAPHY;  Surgeon: Antonio Booze, MD;  Location: [facility];  Service: Cardiovascular;  Laterality: N/A;      Allergies  Allergen Reactions   Morphine     Other reaction(s): Angioedema      Family History  Problem Relation Age of Onset   Heart disease Mother    Heart disease Brother    Diabetes Sister    Colon cancer Brother        over age 67   Stomach cancer Sister    Lupus Sister    Heart disease Sister      Social History Antonio Watkins reports that he has been smoking cigars and cigarettes. He started smoking about 28 years ago. He has a 20.00 pack-year smoking history. He has never used smokeless tobacco. Antonio Watkins reports current alcohol use.   Review of Systems CONSTITUTIONAL: No weight loss, fever, chills, weakness or fatigue.  HEENT: Eyes: No visual loss, blurred vision, double vision or yellow sclerae.No hearing loss, sneezing, congestion, runny nose or sore throat.  SKIN: No rash or itching.  CARDIOVASCULAR:  RESPIRATORY: No shortness of breath, cough or sputum.  GASTROINTESTINAL: No anorexia, nausea, vomiting or diarrhea. No abdominal pain or blood.  GENITOURINARY: No burning on urination, no polyuria NEUROLOGICAL: No headache, dizziness, syncope, paralysis, ataxia, numbness or tingling in the extremities. No change in bowel or bladder control.  MUSCULOSKELETAL: No muscle, back pain, joint pain or stiffness.  LYMPHATICS: No enlarged nodes. No history of splenectomy.  PSYCHIATRIC: No history of depression or anxiety.  ENDOCRINOLOGIC: No reports of sweating, cold or heat intolerance. No polyuria or polydipsia.  Marland Kitchen   Physical Examination There were no vitals filed for this visit. There were no vitals filed for this visit.  Gen: resting comfortably, no acute distress HEENT: no scleral icterus, pupils equal round and reactive, no palptable cervical adenopathy,  CV Resp: Clear to auscultation bilaterally GI: abdomen is soft, non-tender, non-distended, normal bowel sounds, no hepatosplenomegaly MSK: extremities are warm, no edema.  Skin: warm, no rash Neuro:   no focal deficits Psych: appropriate affect   Diagnostic Studies     Assessment and Plan  1. CAD - no symptoms, continue current meds     2. Hyperlipidemia - continue statin, request pcp labs   3. Afib - no symptoms - off av nodal agents due to braydcardia. EKG today shows afib at 97 - continue anticoag   4. HTN - elevated today, he will call with home bp's in 1 week. Would start norvasc 5mg  daily if needed for bp        Arnoldo Lenis, M.D., F.A.C.C.

## 2021-10-16 ENCOUNTER — Other Ambulatory Visit: Payer: Self-pay | Admitting: Cardiology

## 2021-10-16 ENCOUNTER — Ambulatory Visit: Payer: Medicare PPO | Admitting: Cardiology

## 2022-09-01 ENCOUNTER — Other Ambulatory Visit: Payer: Self-pay | Admitting: Cardiology

## 2022-11-17 ENCOUNTER — Other Ambulatory Visit: Payer: Self-pay | Admitting: Cardiology
